# Patient Record
Sex: Male | Born: 1980 | Race: White | Hispanic: No | Marital: Married | Smoking: Never smoker
Health system: Southern US, Community
[De-identification: ages and names within clinical notes are randomized; demographics above are authoritative.]

## PROBLEM LIST (undated history)

## (undated) DIAGNOSIS — K219 Gastro-esophageal reflux disease without esophagitis: Secondary | ICD-10-CM

## (undated) DIAGNOSIS — I10 Essential (primary) hypertension: Secondary | ICD-10-CM

## (undated) DIAGNOSIS — E785 Hyperlipidemia, unspecified: Secondary | ICD-10-CM

## (undated) DIAGNOSIS — K5090 Crohn's disease, unspecified, without complications: Secondary | ICD-10-CM

## (undated) HISTORY — DX: Gastro-esophageal reflux disease without esophagitis: K21.9

## (undated) HISTORY — DX: Hyperlipidemia, unspecified: E78.5

## (undated) HISTORY — DX: Essential (primary) hypertension: I10

## (undated) HISTORY — PX: LIVER BIOPSY: SHX301

## (undated) HISTORY — DX: Crohn's disease, unspecified, without complications: K50.90

---

## 2002-08-20 ENCOUNTER — Encounter: Payer: Self-pay | Admitting: Orthopedic Surgery

## 2002-08-20 ENCOUNTER — Encounter: Admission: RE | Admit: 2002-08-20 | Discharge: 2002-08-20 | Payer: Self-pay | Admitting: Orthopedic Surgery

## 2002-10-23 IMAGING — CR DG ABDOMEN ACUTE W/ 1V CHEST
3 series · 3 of 3 positions shown · non-contrast
Comparison: none

CLINICAL DATA: Abdominal pain. 
 ACUTE ABDOMINAL SERIES WITH CHEST
 No comparison. 
 PA chest demonstrates normal cardiomediastinal contours and clear lungs. 
 Supine and erect views of the abdomen demonstrate a normal bowel gas pattern and no free intraperitoneal air.  No suspicious abdominal calcifications are seen.  The osseous structures appear unremarkable.

[view not recorded (1 of 3)]
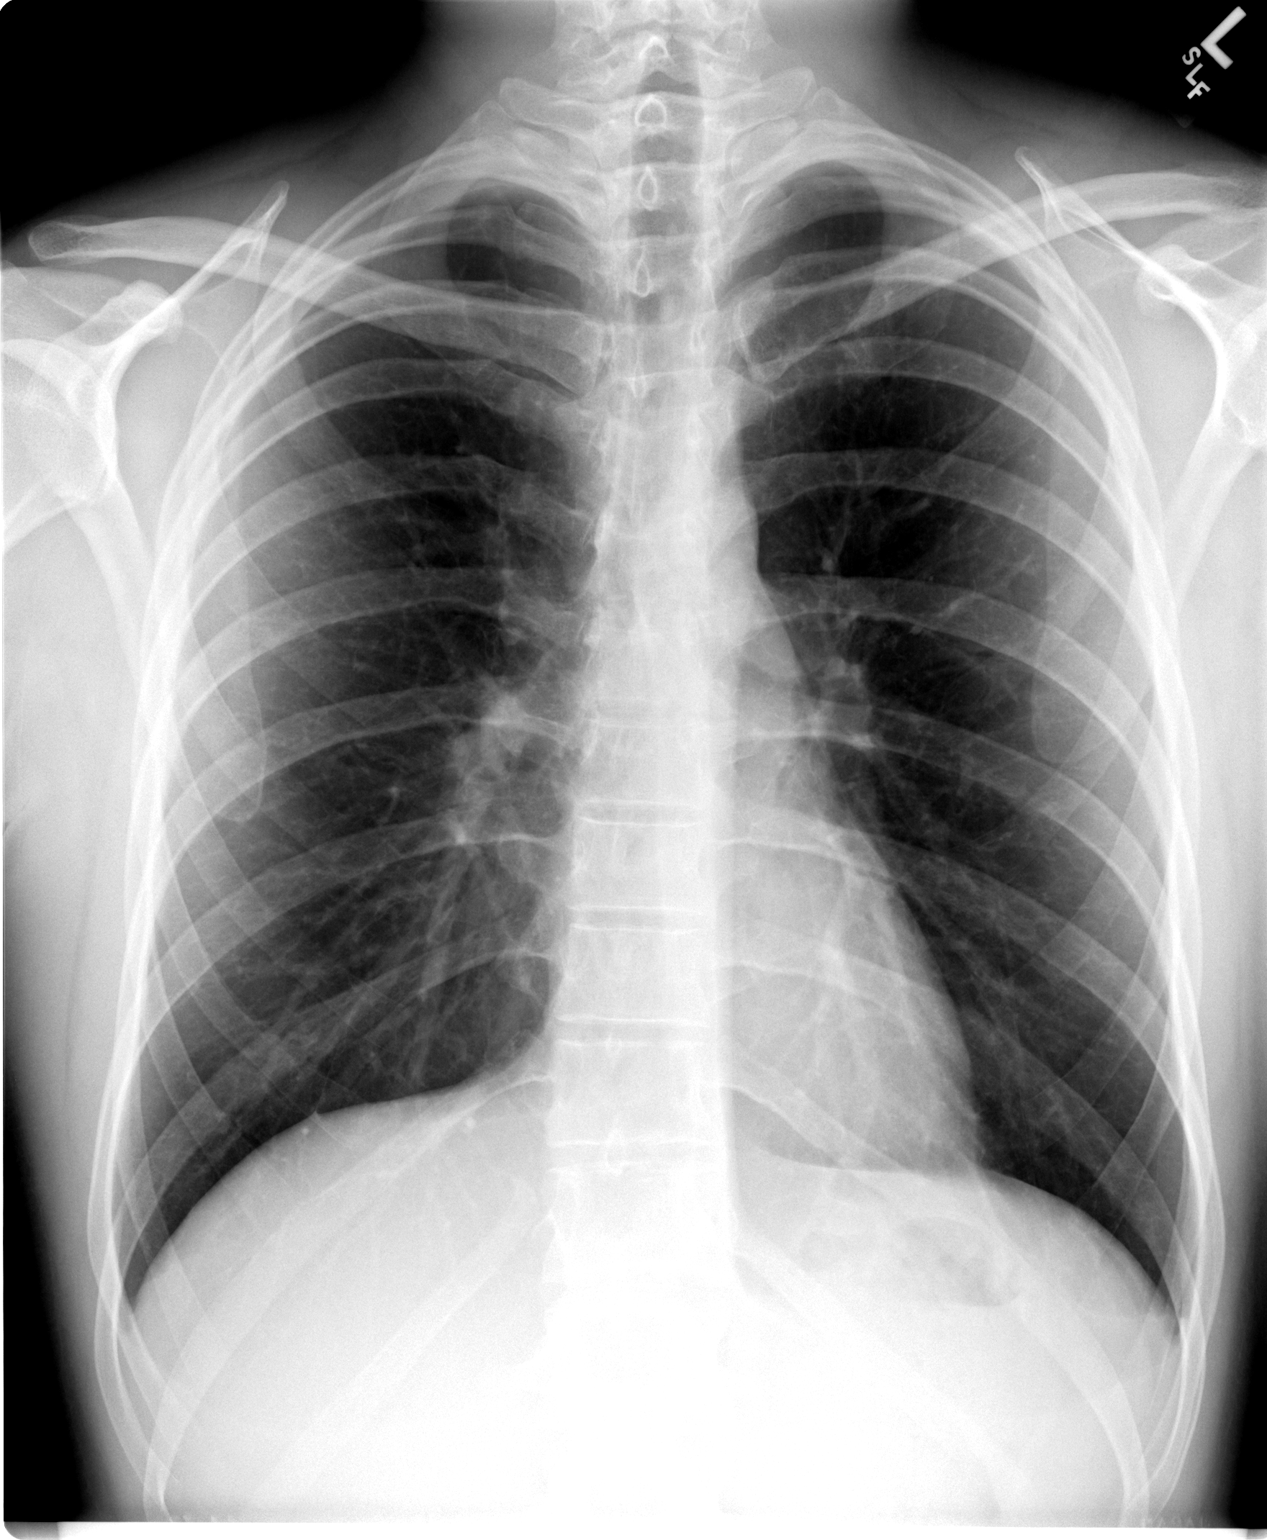

[view not recorded (2 of 3)]
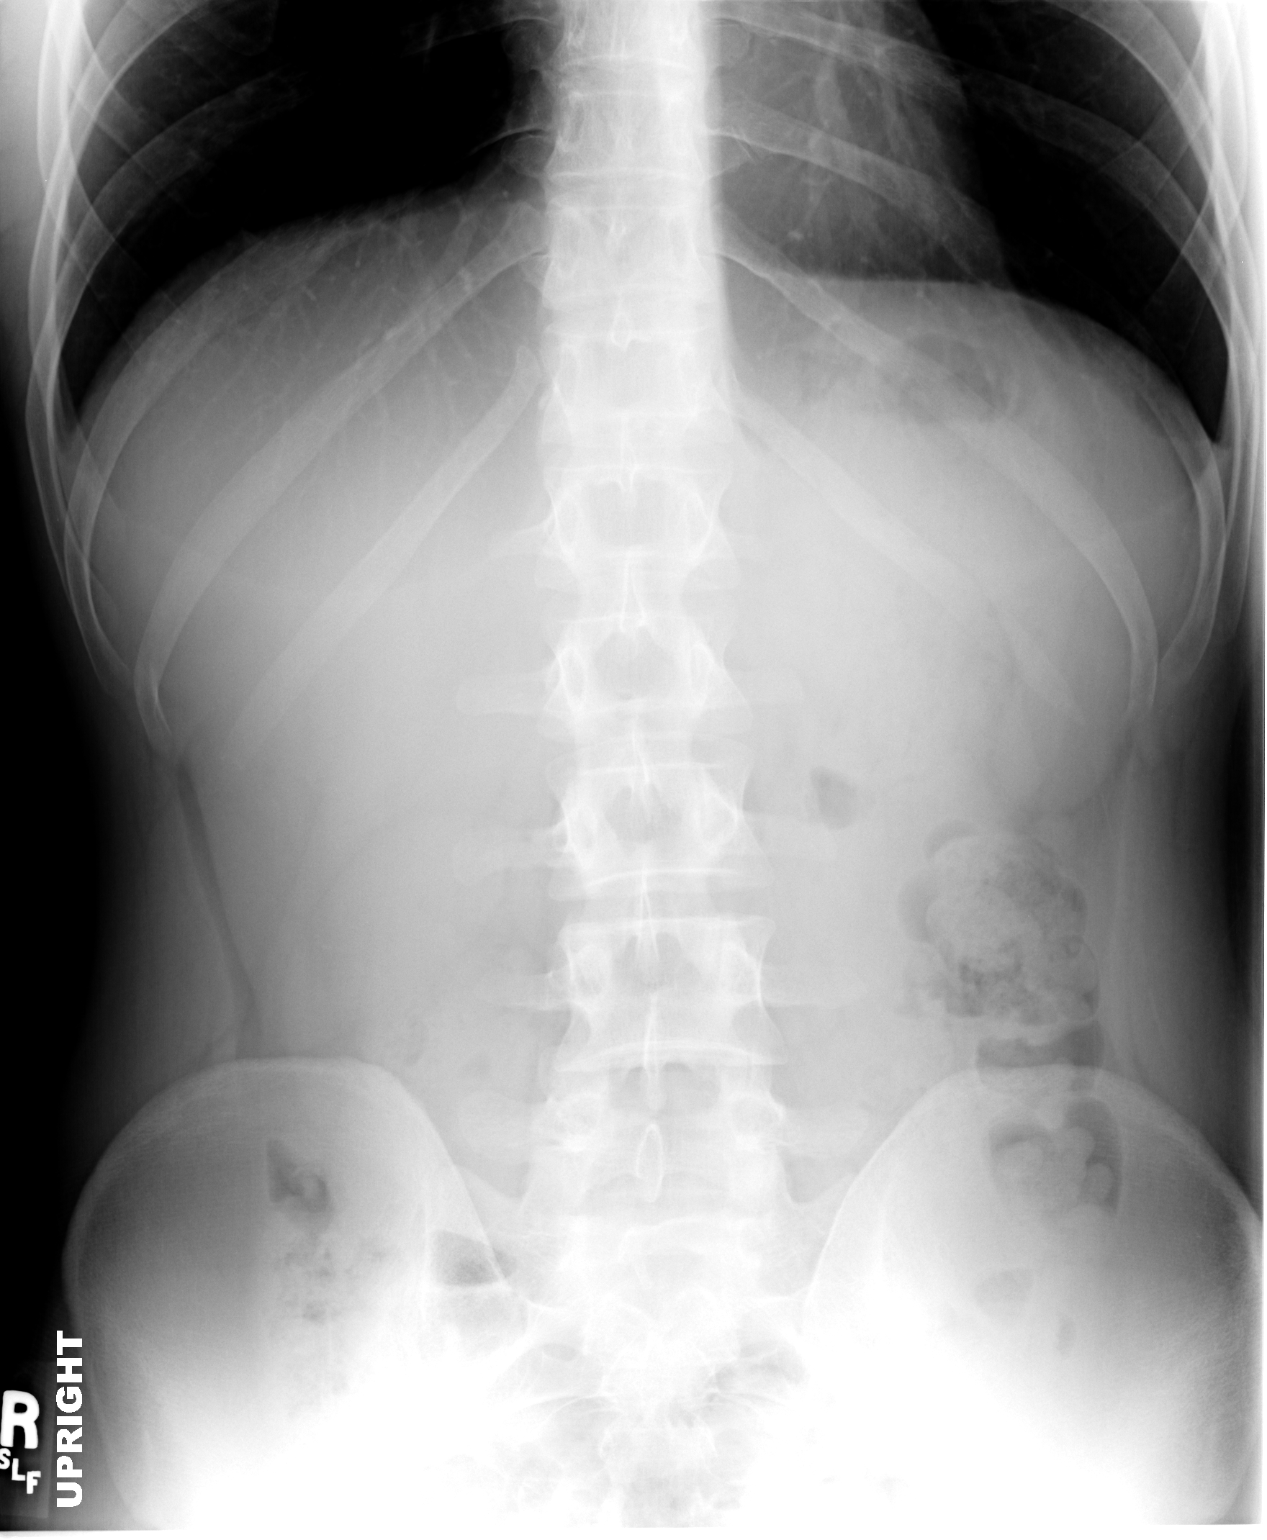

[view not recorded (3 of 3)]
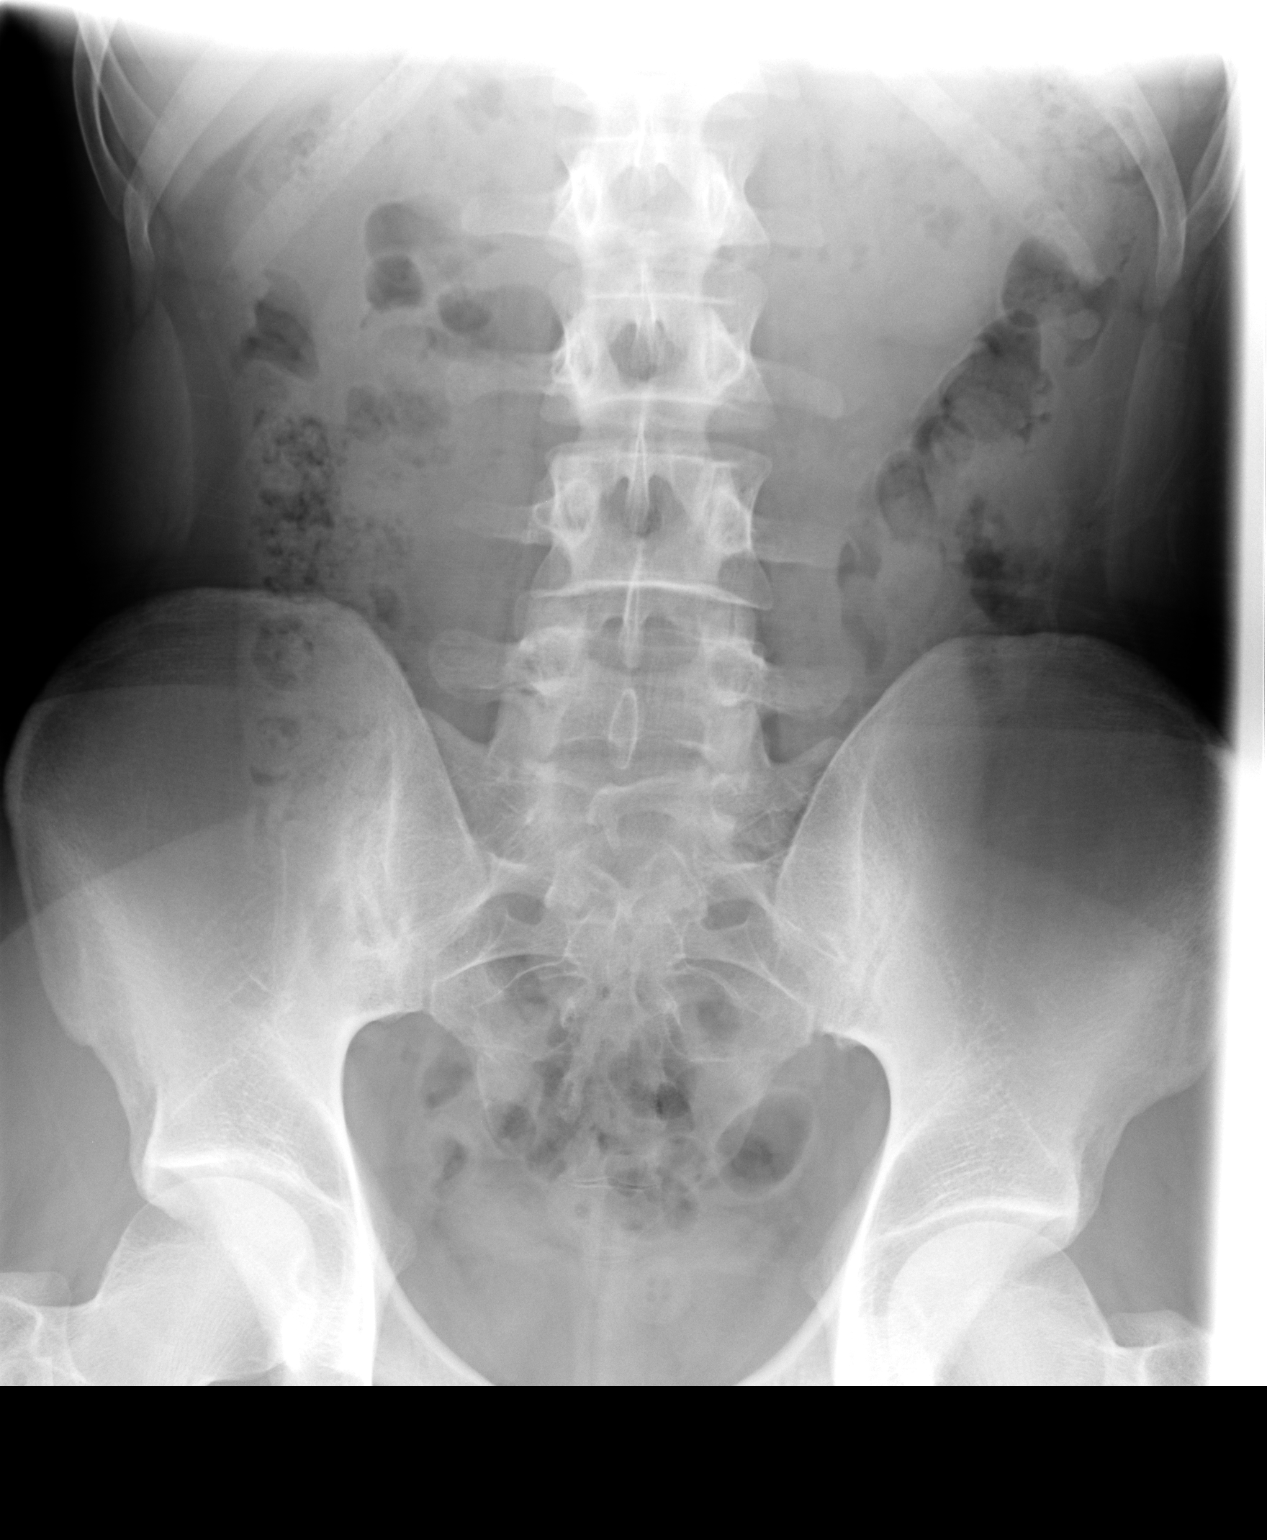

[3 of 3 positions shown; findings below may reference images not displayed]

IMPRESSION: No active cardiopulmonary or abdominal process demonstrated.

## 2003-11-07 ENCOUNTER — Emergency Department (HOSPITAL_COMMUNITY): Admission: EM | Admit: 2003-11-07 | Discharge: 2003-11-07 | Payer: Self-pay | Admitting: Emergency Medicine

## 2004-11-13 ENCOUNTER — Emergency Department: Payer: Self-pay | Admitting: Emergency Medicine

## 2004-11-13 IMAGING — CR DG FOREARM 2V*L*
1 series · 2 of 2 positions shown · non-contrast
Comparison: none

REASON FOR EXAM: Injury
COMMENTS:

[Series 1: view not recorded · 0.17mm/px · 2 of 2 slices shown]
[im 1/2]
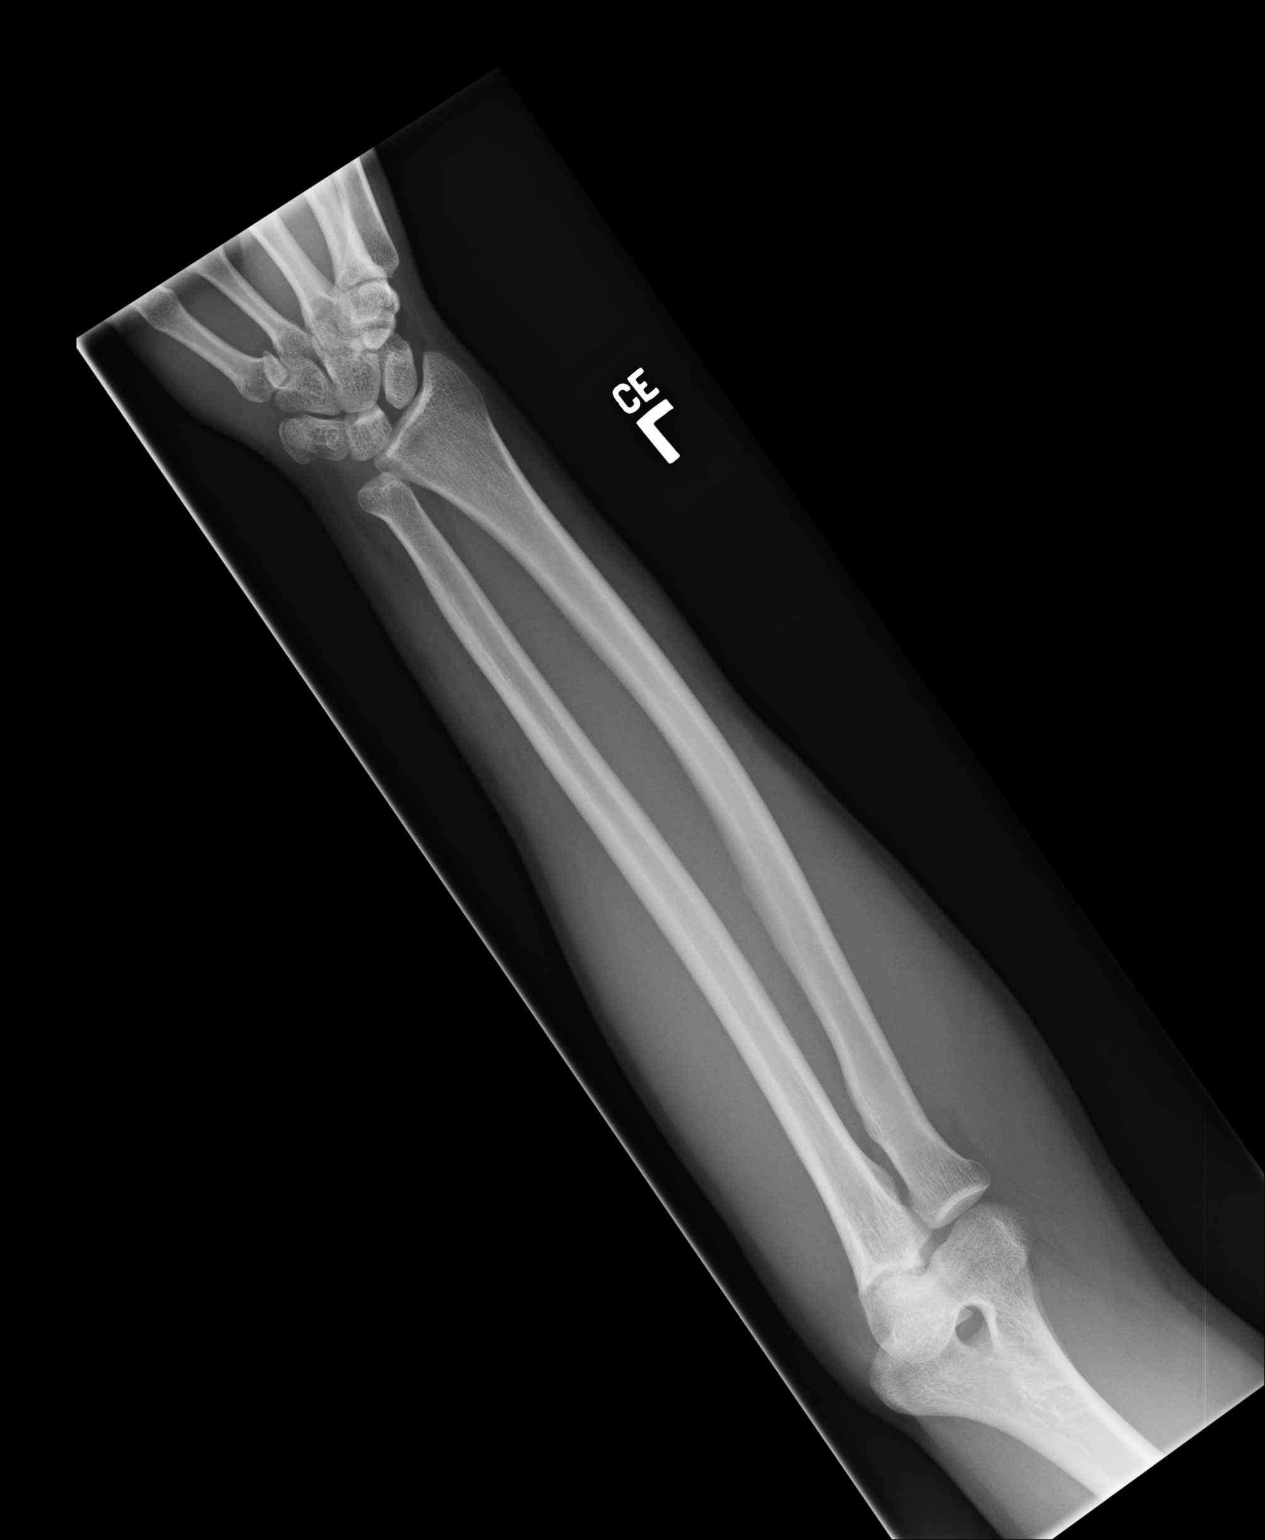
[im 2/2]
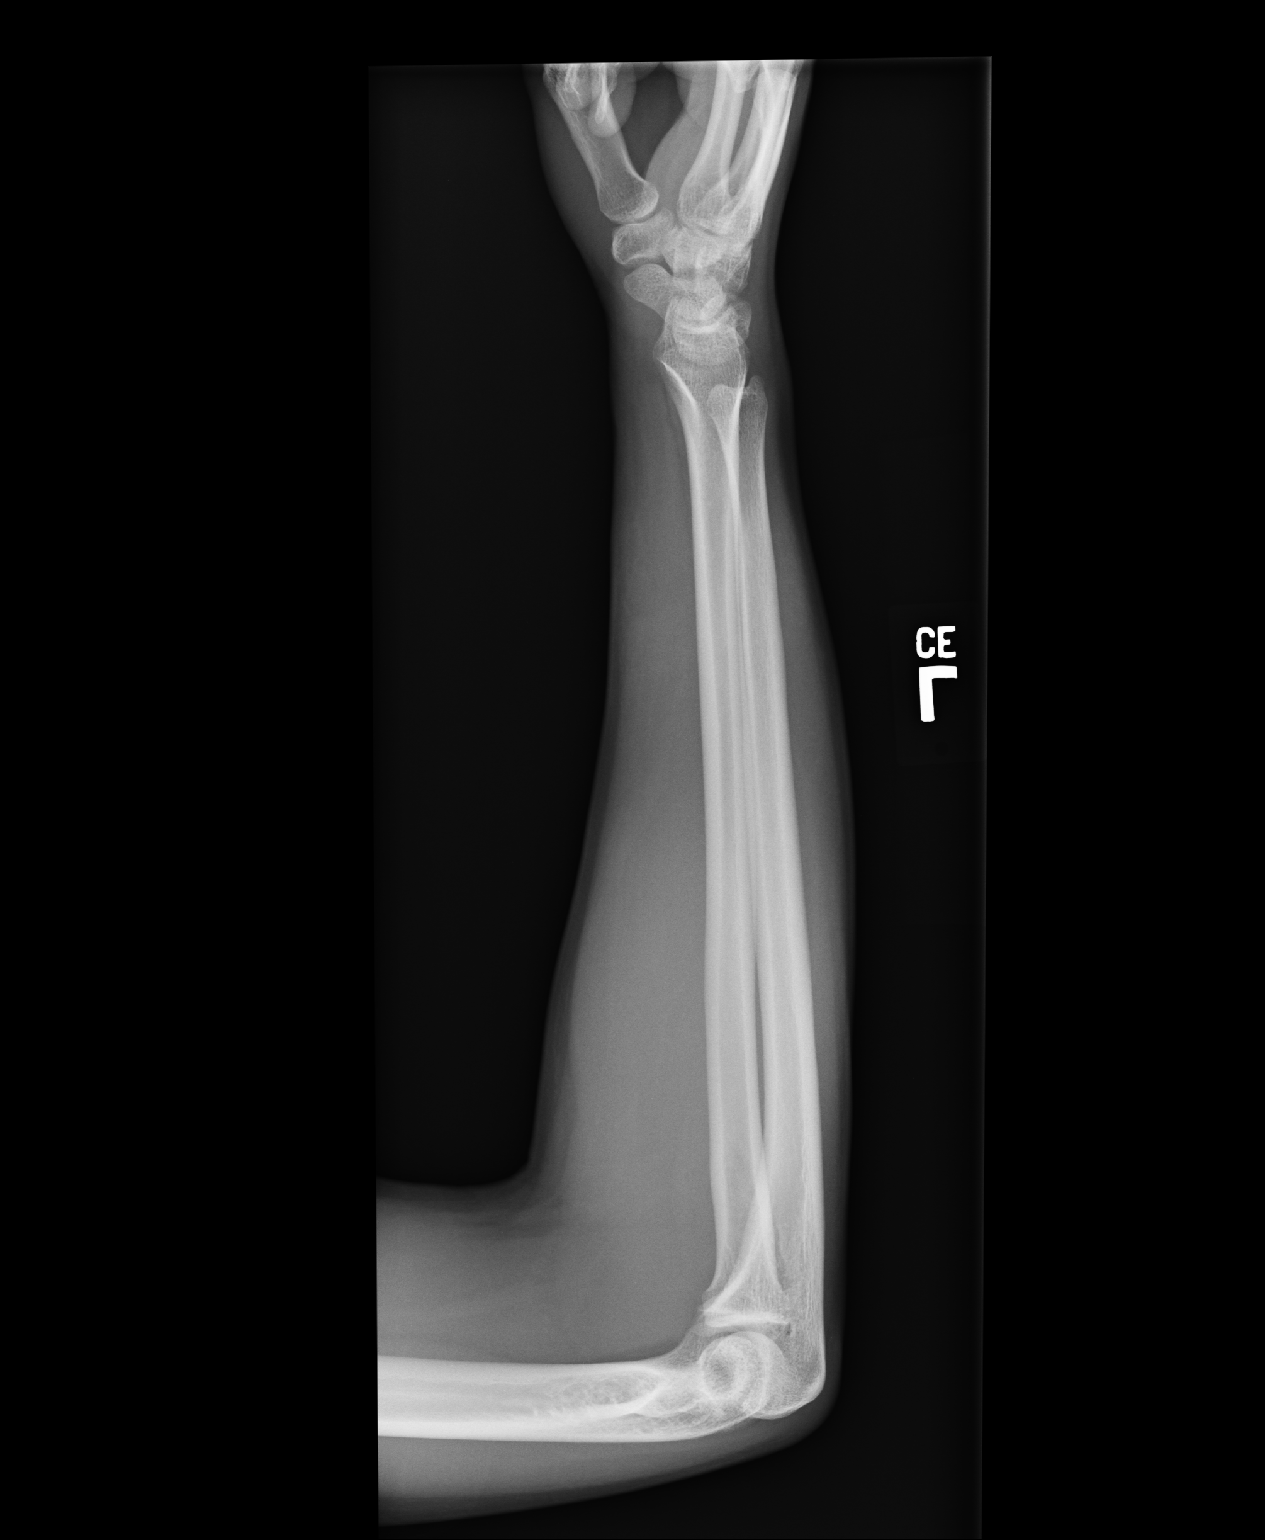

[2 of 2 positions shown; findings below may reference images not displayed]

PROCEDURE:     DXR - DXR FOREARM LEFT  - [DATE] [DATE]

RESULT:     The bones of the forearm appear adequately mineralized.  I do
not see objective evidence of a displaced or nondisplaced fracture.  The
overlying soft tissues are normal in appearance.  The observed portions of
the elbow and wrist appear normal.
IMPRESSION: I do not see evidence of acute fracture of the LEFT
forearm.

## 2005-03-19 ENCOUNTER — Encounter: Admission: RE | Admit: 2005-03-19 | Discharge: 2005-03-19 | Payer: Self-pay | Admitting: Family Medicine

## 2005-03-19 IMAGING — CT CT ABDOMEN WO/W CM
1 of 4 series · 13 of 32 positions shown, 19 images · IV contrast (READICAT/WATER & [ID] OMNI 300)
Comparison: [REDACTED] abdominal radiographs, [DATE]

CLINICAL DATA: Bilateral mid and flank abdominal pain. 
 CT SCAN OF THE ABDOMEN WITH AND WITHOUT CONTRAST:
TECHNIQUE: Multidetector CT imaging of the abdomen was performed both before and during bolus administration of intravenous contrast.
 Contrast:  100 cc Omnipaque 300
TECHNIQUE: Multidetector CT imaging of the pelvis was performed following the standard protocol during bolus administration of intravenous contrast.

[Series 3: abd/pelvis · axial · 0.70mm/px · z∈[-353,+12]mm · 13 of 85 slices shown, 19 images]
[im 6/85  soft-tissue]
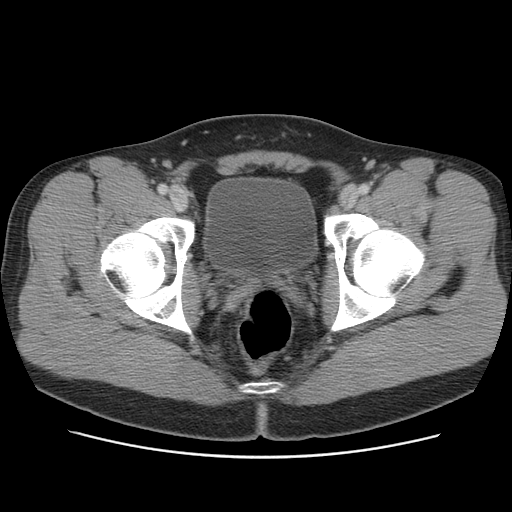
[im 6/85  bone]
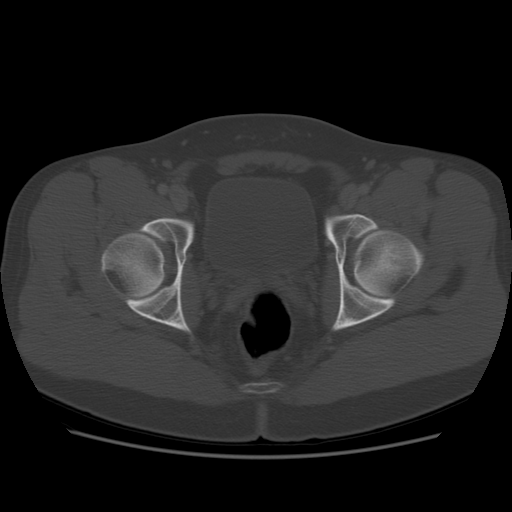
[im 12/85  soft-tissue]
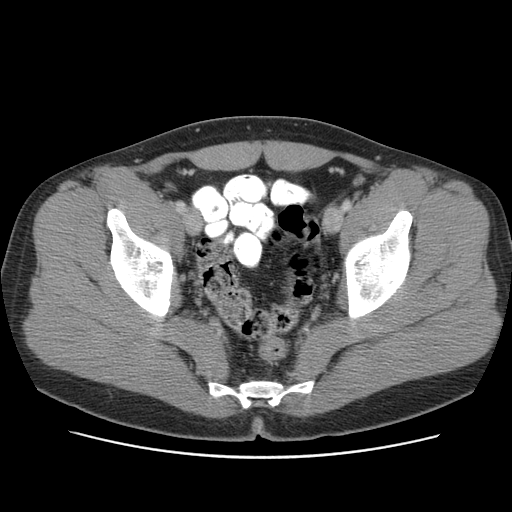
[im 17/85  soft-tissue]
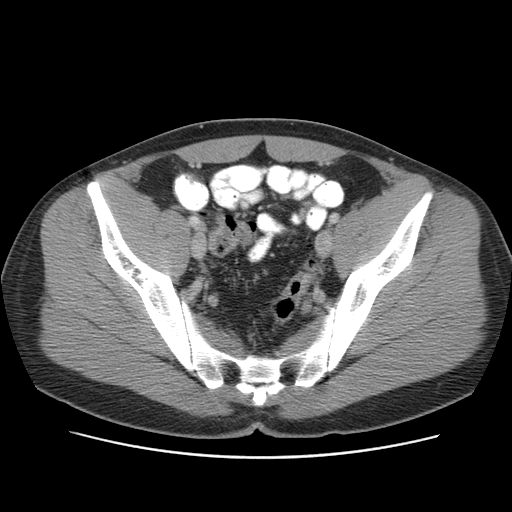
[im 23/85  soft-tissue]
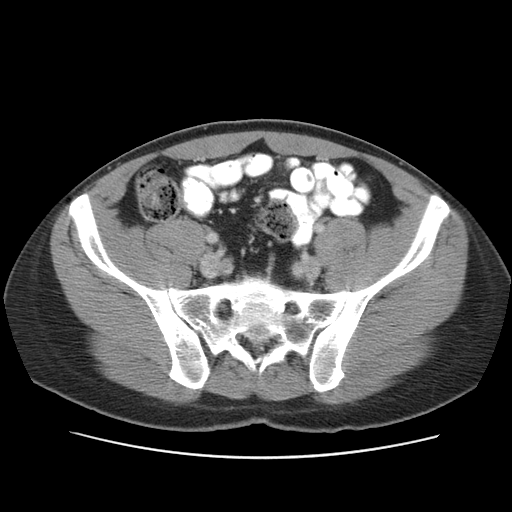
[im 29/85  soft-tissue]
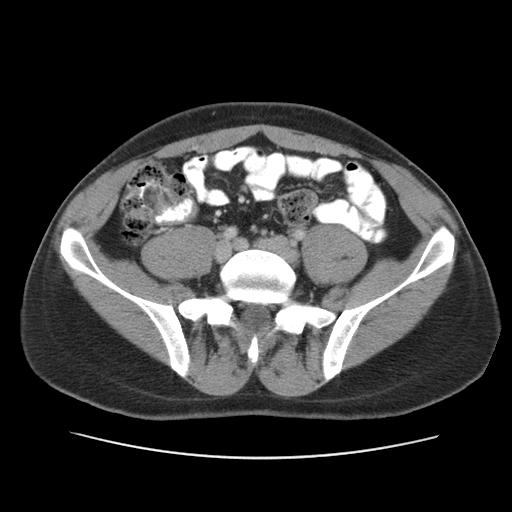
[im 34/85  soft-tissue]
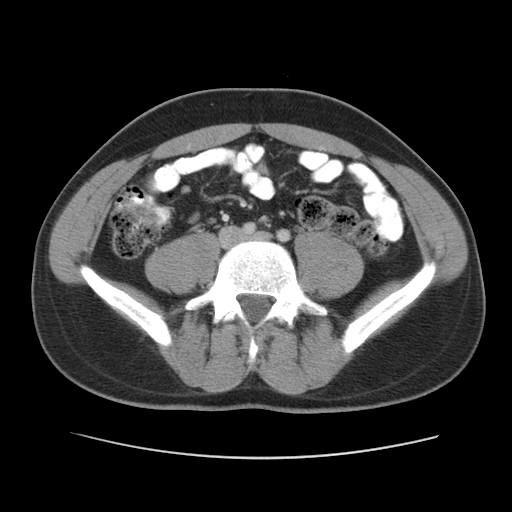
[im 45/85  soft-tissue]
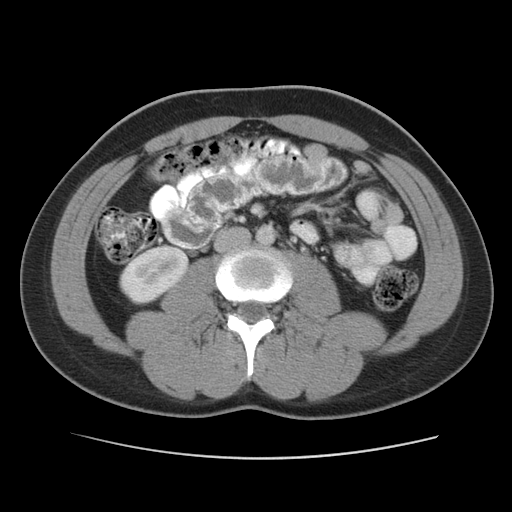
[im 51/85  soft-tissue]
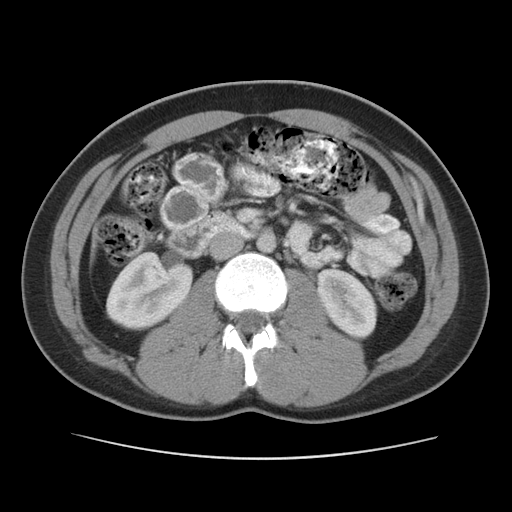
[im 57/85  soft-tissue]
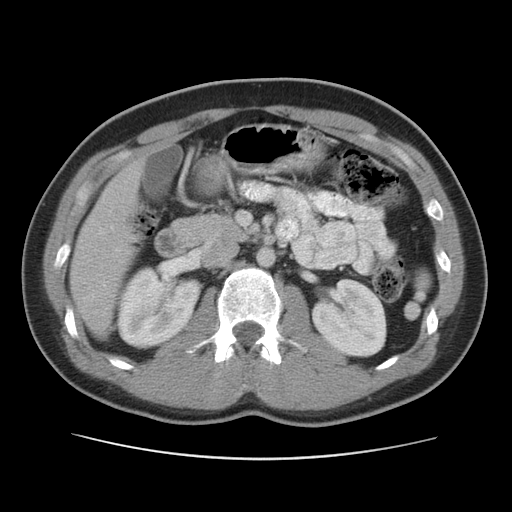
[im 57/85  bone]
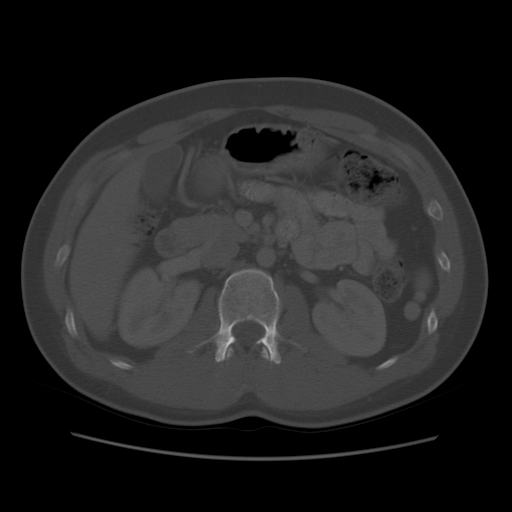
[im 62/85  soft-tissue]
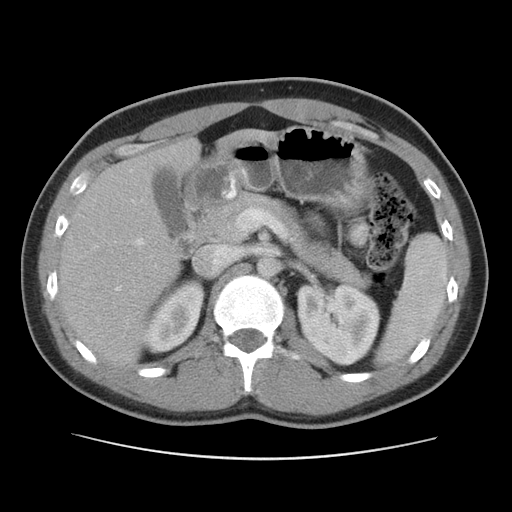
[im 62/85  lung]
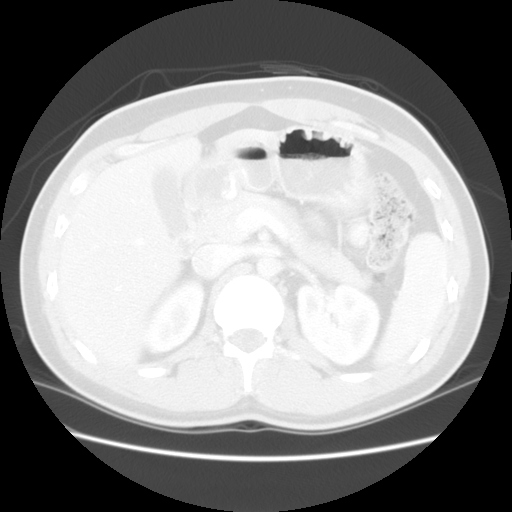
[im 68/85  soft-tissue]
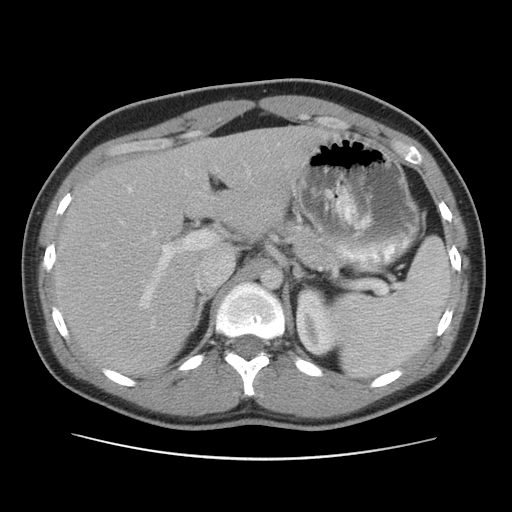
[im 68/85  lung]
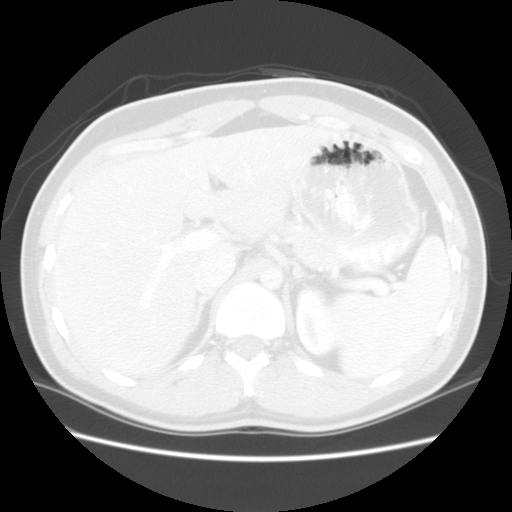
[im 73/85  soft-tissue]
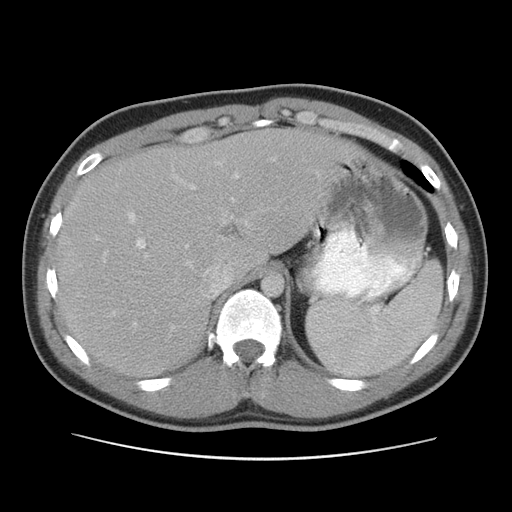
[im 73/85  lung]
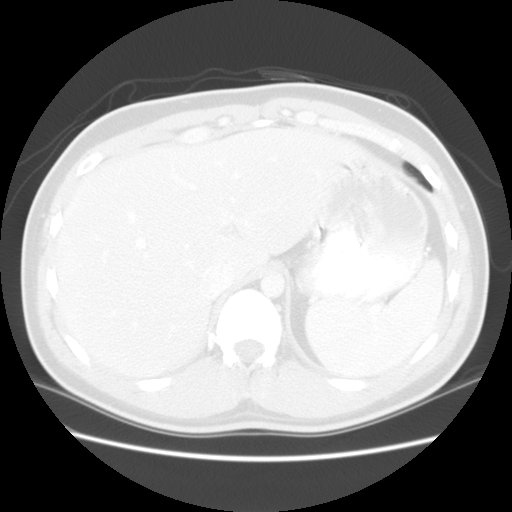
[im 79/85  soft-tissue]
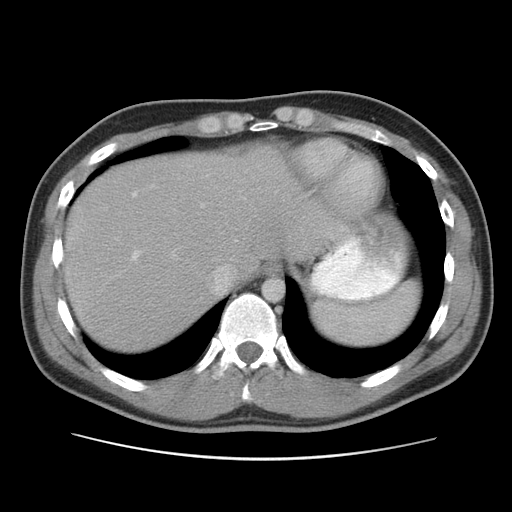
[im 79/85  lung]
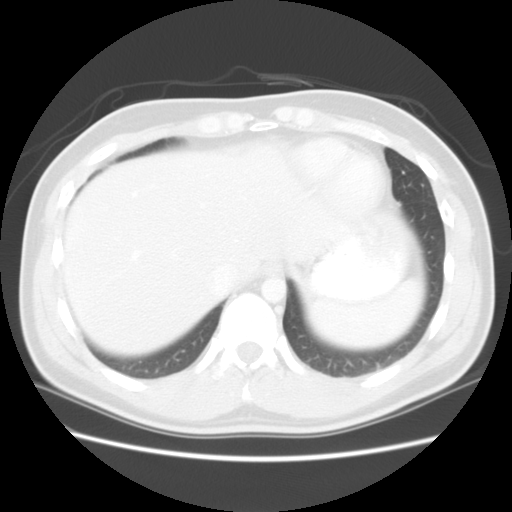

[13 of 32 positions shown; findings below may reference images not displayed]

FINDINGS: The abdominal parenchymal organs are unremarkable.  There is no evidence of mass or adenopathy.  No significant process or abnormal fluid collections are identified.  No other significant abnormality noted.  Constipation findings are seen.  Air-filled proximal appendix appears normal.
IMPRESSION: 1.  Constipation. 
 2.  Otherwise normal.
 PELVIS CT WITH CONTRAST:
FINDINGS: There is no evidence of pelvic mass or adenopathy.  No inflammatory process or abnormal fluid collections are identified.  No other significant abnormality noted.  Constipation findings are seen.
IMPRESSION: 1.  Constipation. 
 2.  Otherwise normal.

## 2011-12-10 DIAGNOSIS — E8881 Metabolic syndrome: Secondary | ICD-10-CM | POA: Insufficient documentation

## 2013-10-01 DIAGNOSIS — I10 Essential (primary) hypertension: Secondary | ICD-10-CM | POA: Insufficient documentation

## 2014-05-25 DIAGNOSIS — E785 Hyperlipidemia, unspecified: Secondary | ICD-10-CM | POA: Insufficient documentation

## 2016-06-10 ENCOUNTER — Telehealth: Payer: Self-pay | Admitting: *Deleted

## 2016-06-10 ENCOUNTER — Encounter: Payer: Self-pay | Admitting: *Deleted

## 2016-06-10 NOTE — Telephone Encounter (Signed)
PreVisit Call completed. Pt will bring in a list of medications to visit. No acute complaints.

## 2016-06-11 ENCOUNTER — Encounter: Payer: Self-pay | Admitting: Family Medicine

## 2016-06-11 ENCOUNTER — Ambulatory Visit (INDEPENDENT_AMBULATORY_CARE_PROVIDER_SITE_OTHER): Payer: 59 | Admitting: Family Medicine

## 2016-06-11 VITALS — BP 126/76 | HR 82 | Temp 98.5°F | Ht 76.0 in | Wt 232.6 lb

## 2016-06-11 DIAGNOSIS — R7303 Prediabetes: Secondary | ICD-10-CM

## 2016-06-11 DIAGNOSIS — R41840 Attention and concentration deficit: Secondary | ICD-10-CM

## 2016-06-11 DIAGNOSIS — K219 Gastro-esophageal reflux disease without esophagitis: Secondary | ICD-10-CM | POA: Insufficient documentation

## 2016-06-11 DIAGNOSIS — R202 Paresthesia of skin: Secondary | ICD-10-CM

## 2016-06-11 DIAGNOSIS — Z7689 Persons encountering health services in other specified circumstances: Secondary | ICD-10-CM

## 2016-06-11 DIAGNOSIS — L309 Dermatitis, unspecified: Secondary | ICD-10-CM | POA: Insufficient documentation

## 2016-06-11 DIAGNOSIS — K589 Irritable bowel syndrome without diarrhea: Secondary | ICD-10-CM | POA: Insufficient documentation

## 2016-06-11 DIAGNOSIS — R200 Anesthesia of skin: Secondary | ICD-10-CM

## 2016-06-11 DIAGNOSIS — K760 Fatty (change of) liver, not elsewhere classified: Secondary | ICD-10-CM | POA: Insufficient documentation

## 2016-06-11 DIAGNOSIS — E538 Deficiency of other specified B group vitamins: Secondary | ICD-10-CM | POA: Insufficient documentation

## 2016-06-11 DIAGNOSIS — K5090 Crohn's disease, unspecified, without complications: Secondary | ICD-10-CM | POA: Insufficient documentation

## 2016-06-11 LAB — HEMOGLOBIN A1C: HEMOGLOBIN A1C: 6.3 % (ref 4.6–6.5)

## 2016-06-11 LAB — VITAMIN B12: VITAMIN B 12: 189 pg/mL — AB (ref 211–911)

## 2016-06-11 LAB — VITAMIN D 25 HYDROXY (VIT D DEFICIENCY, FRACTURES): VITD: 12.63 ng/mL — AB (ref 30.00–100.00)

## 2016-06-11 LAB — TSH: TSH: 1.99 u[IU]/mL (ref 0.35–4.50)

## 2016-06-11 NOTE — Progress Notes (Signed)
Pre visit review using our clinic review tool, if applicable. No additional management support is needed unless otherwise documented below in the visit note. 

## 2016-06-11 NOTE — Progress Notes (Signed)
Subjective:    Patient ID: Johnny [name], male    DOB: Dec 27, 1980, 36 y.o.   MRN: [id number]  HPI This is a 36 yo male who presents today to establish care. Was previously seen by Dr. Salvadore Dom at [place]. Last visit 03/04/16.   Sees Dr. Glennon [facility] in [place] for Crohn's, diagnosed at ae 24. No GI complaints currently.  Has been keeping an eye on his blood sugars. Has noticed some tingling in hands and feet and head feels "swimmy." Unable to determine what brings it on, but it happens frequently. Hands feel numb and he feels "tingly," up arms and legs. Seems to take more time to focus. Seems to be worse if he feels stressed. He is Investment banker, operational. Sits at desk on computer. The only thing different was that he started taking Align (3 months ago), stopped a couple days ago and thinks symptoms improved.   He likes sports, swimming. Has 2,4 yo kids.   HgbA1C 6.2 02/27/16. Had 2 hour glucose tolerance testing in past.    Last CPE- last year Colonoscopy- not sure when, doesn't need until age 33 Tdap- 12/10/2011 Flu- annual Dental- regular Eye- less than 2 years ago, wears contacts Exercise- off and on   Past Medical History:  Diagnosis Date  . Crohn's disease ([place])   . GERD (gastroesophageal reflux disease)   . Hyperlipidemia   . Hypertension    No past surgical history on file. No family history on file. Social History  Substance Use Topics  . Smoking status: Never Smoker  . Smokeless tobacco: Never Used  . Alcohol use No      Review of Systems  Constitutional: Negative for fatigue and unexpected weight change.  Eyes: Negative for photophobia.  Respiratory: Negative for cough, chest tightness and shortness of breath.   Cardiovascular: Negative for chest pain and leg swelling.  Gastrointestinal: Negative for abdominal pain, anal bleeding, blood in stool, constipation, diarrhea, nausea and vomiting.  Genitourinary: Negative.   Musculoskeletal: Negative.     Allergic/Immunologic: Positive for environmental allergies (occasional).  Psychiatric/Behavioral: Negative.        Objective:   Physical Exam  Constitutional: He is oriented to person, place, and time. He appears well-developed and well-nourished. No distress.  HENT:  Head: Normocephalic and atraumatic.  Right Ear: Tympanic membrane, external ear and ear canal normal.  Left Ear: Tympanic membrane, external ear and ear canal normal.  Nose: Nose normal.  Mouth/Throat: Oropharynx is clear and moist. No oropharyngeal exudate.  Eyes: Conjunctivae and EOM are normal. Pupils are equal, round, and reactive to light. Right eye exhibits no discharge. Left eye exhibits no discharge. No scleral icterus.  Neck: Normal range of motion. Neck supple.  Cardiovascular: Normal rate, regular rhythm, normal heart sounds and intact distal pulses.   Pulmonary/Chest: Effort normal and breath sounds normal.  Abdominal: Soft. Bowel sounds are normal. He exhibits no distension. There is no tenderness. There is no rebound and no guarding.  Musculoskeletal: He exhibits no edema.  Neurological: He is alert and oriented to person, place, and time. He has normal reflexes. No cranial nerve deficit. Coordination normal.  Sensation intact UE/LE  Skin: Skin is warm and dry. He is not diaphoretic.  Bilateral LE with intact skin, warm, dry, no vascular changes.   Psychiatric: He has a normal mood and affect. His behavior is normal. Judgment and thought content normal.  Vitals reviewed.     BP 126/76 (BP Location: Right Arm, Patient Position: Sitting, Cuff Size: Normal)  Pulse 82   Temp 98.5 F (36.9 C) (Oral)   Ht 6' 4"  (1.93 m)   Wt 232 lb 9.6 oz (105.5 kg)   SpO2 98%   BMI 28.31 kg/m  Wt Readings from Last 3 Encounters:  06/11/16 232 lb 9.6 oz (105.5 kg)   Down from 249 pounds 1/18 with diet   Depression screen PHQ 2/9 06/11/2016  Decreased Interest 0  Down, Depressed, Hopeless 0  PHQ - 2 Score 0        Assessment & Plan:  1. Encounter to establish care - reviewed records from previous PCP  2. Prediabetes - Hemoglobin A1c  3. Numbness and tingling - HgbA1C was 6.2 prior to weight loss, would not expect symptoms to be coming from hyperglycemia - continue to hold Align - Hemoglobin A1c - TSH - Vitamin B12 - VITAMIN D 25 Hydroxy (Vit-D Deficiency, Fractures)  4. Poor concentration - TSH - Vitamin B12 - VITAMIN D 25 Hydroxy (Vit-D Deficiency, Fractures)  - follow up in 6 months   Clarene Reamer, FNP-BC  [facility] [place], [facility]  06/11/2016 2:22 PM

## 2016-06-11 NOTE — Patient Instructions (Signed)
Thanks for coming in today!  Stay off Align  I will touch base with you next week with your lab results and we will go from there

## 2016-06-14 ENCOUNTER — Telehealth: Payer: Self-pay | Admitting: Family Medicine

## 2016-06-14 DIAGNOSIS — E559 Vitamin D deficiency, unspecified: Secondary | ICD-10-CM

## 2016-06-14 MED ORDER — VITAMIN D3 1.25 MG (50000 UT) PO CAPS: ORAL_CAPSULE | ORAL | 1 refills | Status: DC

## 2016-06-14 NOTE — Telephone Encounter (Signed)
Called patient regarding labs. See result note for details.

## 2016-06-14 NOTE — Telephone Encounter (Signed)
Please advise 

## 2016-06-14 NOTE — Telephone Encounter (Signed)
Patient called to see if lab results were back yet. Please call patient on mobile phone with results once they are ready. Okay to leave a detailed message on mobile phone.

## 2016-06-15 ENCOUNTER — Other Ambulatory Visit: Payer: Self-pay | Admitting: Family Medicine

## 2016-06-15 ENCOUNTER — Encounter: Payer: Self-pay | Admitting: Family Medicine

## 2016-06-15 DIAGNOSIS — K5090 Crohn's disease, unspecified, without complications: Secondary | ICD-10-CM

## 2016-06-15 DIAGNOSIS — E538 Deficiency of other specified B group vitamins: Secondary | ICD-10-CM

## 2016-06-15 MED ORDER — CYANOCOBALAMIN 1000 MCG/ML IJ SOLN: 1000.0000 ug | INTRAMUSCULAR | Status: DC

## 2016-06-16 ENCOUNTER — Ambulatory Visit (INDEPENDENT_AMBULATORY_CARE_PROVIDER_SITE_OTHER): Payer: 59

## 2016-06-16 DIAGNOSIS — E538 Deficiency of other specified B group vitamins: Secondary | ICD-10-CM | POA: Diagnosis not present

## 2016-06-16 MED ORDER — CYANOCOBALAMIN 1000 MCG/ML IJ SOLN
1000.0000 ug | Freq: Once | INTRAMUSCULAR | Status: AC
  Administered 2016-06-16: 1000 ug via INTRAMUSCULAR

## 2016-06-16 NOTE — Progress Notes (Signed)
Pre visit review using our clinic review tool, if applicable. No additional management support is needed unless otherwise documented below in the visit note. 

## 2016-06-16 NOTE — Progress Notes (Signed)
Johnny [name] presents for his first of six vitamin b12 injections. He tolerated medication well. Advised to schedule next month for 2nd dose. He is agreeable.

## 2016-06-21 NOTE — Progress Notes (Signed)
Note reviewed. Patient with vitamin B12 deficiency and Crohn's, therefore not a candidate for oral replacement.

## 2016-07-14 ENCOUNTER — Ambulatory Visit (INDEPENDENT_AMBULATORY_CARE_PROVIDER_SITE_OTHER): Payer: 59 | Admitting: *Deleted

## 2016-07-14 DIAGNOSIS — E538 Deficiency of other specified B group vitamins: Secondary | ICD-10-CM

## 2016-07-14 MED ORDER — CYANOCOBALAMIN 1000 MCG/ML IJ SOLN
1000.0000 ug | Freq: Once | INTRAMUSCULAR | Status: AC
  Administered 2016-07-14: 1000 ug via INTRAMUSCULAR

## 2016-07-14 NOTE — Progress Notes (Signed)
B12 injection given for documented B12 deficiency.

## 2016-08-20 ENCOUNTER — Ambulatory Visit (INDEPENDENT_AMBULATORY_CARE_PROVIDER_SITE_OTHER): Payer: 59 | Admitting: Emergency Medicine

## 2016-08-20 DIAGNOSIS — E538 Deficiency of other specified B group vitamins: Secondary | ICD-10-CM | POA: Diagnosis not present

## 2016-08-20 MED ORDER — CYANOCOBALAMIN 1000 MCG/ML IJ SOLN
1000.0000 ug | Freq: Once | INTRAMUSCULAR | Status: AC
  Administered 2016-08-20: 1000 ug via INTRAMUSCULAR

## 2016-08-20 NOTE — Progress Notes (Signed)
Patient given B12 injection for Vitamin B12 deficiency.

## 2016-09-30 ENCOUNTER — Encounter: Payer: Self-pay | Admitting: Family Medicine

## 2016-10-01 ENCOUNTER — Other Ambulatory Visit: Payer: Self-pay | Admitting: Family Medicine

## 2016-10-01 MED ORDER — TRIAMCINOLONE ACETONIDE 0.5 % EX CREA: TOPICAL_CREAM | Freq: Two times a day (BID) | CUTANEOUS | 1 refills | Status: DC

## 2016-10-01 MED ORDER — AMLODIPINE BESYLATE 10 MG PO TABS: 10.0000 mg | ORAL_TABLET | Freq: Every day | ORAL | 3 refills | Status: DC

## 2016-10-01 MED ORDER — ATORVASTATIN CALCIUM 40 MG PO TABS: 40.0000 mg | ORAL_TABLET | Freq: Every day | ORAL | 3 refills | Status: DC

## 2016-10-01 MED ORDER — ICOSAPENT ETHYL 1 G PO CAPS: 2.0000 g | ORAL_CAPSULE | Freq: Two times a day (BID) | ORAL | 5 refills | Status: DC

## 2016-10-18 ENCOUNTER — Encounter: Payer: Self-pay | Admitting: Family Medicine

## 2016-12-24 ENCOUNTER — Encounter: Payer: Self-pay | Admitting: Family Medicine

## 2016-12-24 ENCOUNTER — Ambulatory Visit (INDEPENDENT_AMBULATORY_CARE_PROVIDER_SITE_OTHER): Payer: 59 | Admitting: Family Medicine

## 2016-12-24 VITALS — BP 100/80 | HR 85 | Ht 76.0 in | Wt 233.8 lb

## 2016-12-24 DIAGNOSIS — E538 Deficiency of other specified B group vitamins: Secondary | ICD-10-CM | POA: Diagnosis not present

## 2016-12-24 DIAGNOSIS — H9319 Tinnitus, unspecified ear: Secondary | ICD-10-CM | POA: Insufficient documentation

## 2016-12-24 DIAGNOSIS — E785 Hyperlipidemia, unspecified: Secondary | ICD-10-CM

## 2016-12-24 DIAGNOSIS — I10 Essential (primary) hypertension: Secondary | ICD-10-CM

## 2016-12-24 DIAGNOSIS — K5090 Crohn's disease, unspecified, without complications: Secondary | ICD-10-CM | POA: Diagnosis not present

## 2016-12-24 DIAGNOSIS — H9313 Tinnitus, bilateral: Secondary | ICD-10-CM

## 2016-12-24 DIAGNOSIS — R7303 Prediabetes: Secondary | ICD-10-CM | POA: Diagnosis not present

## 2016-12-24 LAB — LIPID PANEL
CHOLESTEROL: 172 mg/dL (ref 0–200)
HDL: 38 mg/dL — AB (ref >39.00)
NONHDL: 133.6
TRIGLYCERIDES: 237 mg/dL — AB (ref 0.0–149.0)
Total CHOL/HDL Ratio: 5
VLDL: 47.4 mg/dL — ABNORMAL HIGH (ref 0.0–40.0)

## 2016-12-24 LAB — CBC
HEMATOCRIT: 48.2 % (ref 39.0–52.0)
HEMOGLOBIN: 15.2 g/dL (ref 13.0–17.0)
MCHC: 31.6 g/dL (ref 30.0–36.0)
MCV: 72.8 fl — ABNORMAL LOW (ref 78.0–100.0)
PLATELETS: 256 10*3/uL (ref 150.0–400.0)
RBC: 6.62 Mil/uL — ABNORMAL HIGH (ref 4.22–5.81)
RDW: 14.1 % (ref 11.5–15.5)
WBC: 8 10*3/uL (ref 4.0–10.5)

## 2016-12-24 LAB — COMPREHENSIVE METABOLIC PANEL WITH GFR
ALT: 49 U/L (ref 0–53)
AST: 24 U/L (ref 0–37)
Albumin: 4.7 g/dL (ref 3.5–5.2)
Alkaline Phosphatase: 68 U/L (ref 39–117)
BUN: 19 mg/dL (ref 6–23)
CO2: 27 meq/L (ref 19–32)
Calcium: 9.9 mg/dL (ref 8.4–10.5)
Chloride: 103 meq/L (ref 96–112)
Creatinine, Ser: 0.93 mg/dL (ref 0.40–1.50)
GFR: 96.94 mL/min
Glucose, Bld: 107 mg/dL — ABNORMAL HIGH (ref 70–99)
Potassium: 4.4 meq/L (ref 3.5–5.1)
Sodium: 139 meq/L (ref 135–145)
Total Bilirubin: 0.6 mg/dL (ref 0.2–1.2)
Total Protein: 7.7 g/dL (ref 6.0–8.3)

## 2016-12-24 LAB — VITAMIN B12: Vitamin B-12: 508 pg/mL (ref 211–911)

## 2016-12-24 LAB — LDL CHOLESTEROL, DIRECT: Direct LDL: 110 mg/dL

## 2016-12-24 LAB — HEMOGLOBIN A1C: HEMOGLOBIN A1C: 6.1 % (ref 4.6–6.5)

## 2016-12-24 MED ORDER — ATORVASTATIN CALCIUM 20 MG PO TABS: 20.0000 mg | ORAL_TABLET | Freq: Every day | ORAL | 3 refills | Status: DC

## 2016-12-24 NOTE — Assessment & Plan Note (Signed)
Continue oral replacement. Check B12 level today.

## 2016-12-24 NOTE — Assessment & Plan Note (Signed)
Discussed lifestyle modifications. Check A1c today.

## 2016-12-24 NOTE — Assessment & Plan Note (Signed)
At goal. Continue amlodipine.

## 2016-12-24 NOTE — Assessment & Plan Note (Signed)
Given myalgias, will decrease atorvastatin to 79m daily. Check lipid panel today.

## 2016-12-24 NOTE — Progress Notes (Signed)
   Subjective:  Johnny [name] is a 36 y.o. male who presents today with a chief complaint of prediabetes.   HPI:  Prediabetes, chronic issue, stable Diet controlled.  Not on any medications.  No polyuria or polydipsia.  Hypertension, Chronic Problem, Stable BP Readings from Last 3 Encounters:  12/24/16 100/80  06/11/16 126/76   Current Medications: Amlodipine 10 mg daily, compliant without side effects. Interim History: He has noticed more muscle cramps since starting the amlodipine.  ROS: Denies any chest pain, shortness of breath, dyspnea on exertion, leg edema.   Hyperlipidemia, Chronic, Stable Current medication(s): Atorvastatin 40 mg daily Side Effects: Occasional muscle cramps  ROS: No chest pain or shortness of breath.   Crohn's disease, chronic, stable Currently on melasamine.  No recent flareups.  No diarrhea. No Abdominal pain.  B12 deficiency, chronic, stable Is getting B12 injections here however was unable to afford paying $60 a month for this.  He has been taking oral replacement instead.  Ear ringing, new issue Several year history.  Located in both ears.  Stable over the last several years.  No treatments tried.  Has a history of going to concerts.  No other known exposures.  No hearing loss.  No dizziness.  No weakness or numbness.  ROS: Per HPI  PMH: Smoking history reviewed. Never smoker.   Objective:  Physical Exam: BP 100/80   Pulse 85   Ht 6' 4"  (1.93 m)   Wt 233 lb 12.8 oz (106.1 kg)   SpO2 98%   BMI 28.46 kg/m   Gen: NAD, resting comfortably CV: RRR with no murmurs appreciated Pulm: NWOB, CTAB with no crackles, wheezes, or rhonchi GI: Normal bowel sounds present. Soft, Nontender, Nondistended. MSK: No edema, cyanosis, or clubbing noted Skin: Warm, dry Neuro: Grossly normal, moves all extremities Psych: Normal affect and thought content  Assessment/Plan:  Benign essential hypertension At goal. Continue amlodipine.    Dyslipidemia Given myalgias, will decrease atorvastatin to 26m daily. Check lipid panel today.   Prediabetes Discussed lifestyle modifications. Check A1c today.   Vitamin B12 deficiency Continue oral replacement. Check B12 level today.   [place] controlled on current regimen. Will continue.   Tinnitus No red flag signs or symptoms. Likely related to sound exposure. Discussed ways to mask symptoms including white noise machine and noise protection. Return precautions reviewed.   Follow up in 6 months pending results of above testing.   [name] MD 12/24/2016 2:46 PM

## 2016-12-24 NOTE — Patient Instructions (Addendum)
We will check blood work today.  Decrease atorvastatin to 22m daily to see if it helps with your symptoms.  Come back to see me in 6 months, or sooner as needed.  Take care,  Dr PJerline Pain

## 2016-12-24 NOTE — Assessment & Plan Note (Signed)
Well controlled on current regimen. Will continue.

## 2016-12-24 NOTE — Assessment & Plan Note (Signed)
No red flag signs or symptoms. Likely related to sound exposure. Discussed ways to mask symptoms including white noise machine and noise protection. Return precautions reviewed.

## 2016-12-27 ENCOUNTER — Encounter: Payer: Self-pay | Admitting: Family Medicine

## 2017-08-22 ENCOUNTER — Telehealth: Payer: Self-pay | Admitting: Family Medicine

## 2017-08-22 NOTE — Telephone Encounter (Signed)
Noted  

## 2017-08-22 NOTE — Telephone Encounter (Signed)
Chief complaint: sore throat  Reason for call: symptomatic request for health information  Initial comment: caller states has sore throat for 3 days, eyes crusty and voice is leaving, no fever  Nurse: Yolanda Bonine, RN, Sunday Spillers  The patient has new or worsening symptoms.  A triage will be completed.   No related physician visit in the past 2 weeks.  Pt has no chronic conditions.   This is not a behavioral health or substance abuse call.   1 Sore throat with cough/ cold symptoms and 2 present > 5 days  Disp: see PCP within 3 days  Advise given: SEE PCP WITHIN 3 DAYS: you need to be seen within 3 days. Call your doctor during [place] office hours and make an appointment. A clinic or urgent care center are good places to go for care if your doctor's office is closed or you cant get an appointment. NOTE If office will be open tomorrow, tell caller to call then, not in 3 days. CALL BACK IF you become worse. CARE ADVICE GIVEN per sore throat (adult) guideline. DRINK PLENTY LIQUIDS. This is important to prevent dehydration. Cold drinks, popsicles, and milk shakes are especially good. Avoid citrus fruits.Eat a soft diet. Ibuprofen, acetaminophen, pain or fever medicines. Sip warm chicken broth or apple juice. Suck on hard candy or a throat lozenge. Gargle with warm salt water four times a day. To make salt water, put 1/2 teaspoon of salt in 8oz of warm water.  Referred to PCP office   PER [employer].

## 2017-08-22 NOTE — Telephone Encounter (Signed)
Called patient to make an appointment. Patient states he is feeling better and went to a [facility] office over the weekend. Message just for FYI.

## 2017-11-29 ENCOUNTER — Ambulatory Visit: Payer: 59 | Admitting: Family Medicine

## 2017-11-29 ENCOUNTER — Encounter: Payer: Self-pay | Admitting: Family Medicine

## 2017-11-29 VITALS — BP 122/74 | HR 79 | Temp 98.6°F | Ht 76.0 in | Wt 241.6 lb

## 2017-11-29 DIAGNOSIS — R7303 Prediabetes: Secondary | ICD-10-CM | POA: Diagnosis not present

## 2017-11-29 DIAGNOSIS — I10 Essential (primary) hypertension: Secondary | ICD-10-CM

## 2017-11-29 DIAGNOSIS — E785 Hyperlipidemia, unspecified: Secondary | ICD-10-CM

## 2017-11-29 DIAGNOSIS — Z23 Encounter for immunization: Secondary | ICD-10-CM | POA: Diagnosis not present

## 2017-11-29 DIAGNOSIS — Z114 Encounter for screening for human immunodeficiency virus [HIV]: Secondary | ICD-10-CM

## 2017-11-29 DIAGNOSIS — E559 Vitamin D deficiency, unspecified: Secondary | ICD-10-CM

## 2017-11-29 DIAGNOSIS — K5090 Crohn's disease, unspecified, without complications: Secondary | ICD-10-CM

## 2017-11-29 DIAGNOSIS — E538 Deficiency of other specified B group vitamins: Secondary | ICD-10-CM | POA: Diagnosis not present

## 2017-11-29 DIAGNOSIS — Z0001 Encounter for general adult medical examination with abnormal findings: Secondary | ICD-10-CM

## 2017-11-29 LAB — COMPREHENSIVE METABOLIC PANEL WITH GFR
ALT: 44 U/L (ref 0–53)
AST: 21 U/L (ref 0–37)
Albumin: 4.5 g/dL (ref 3.5–5.2)
Alkaline Phosphatase: 67 U/L (ref 39–117)
BUN: 12 mg/dL (ref 6–23)
CO2: 28 meq/L (ref 19–32)
Calcium: 9.7 mg/dL (ref 8.4–10.5)
Chloride: 103 meq/L (ref 96–112)
Creatinine, Ser: 0.99 mg/dL (ref 0.40–1.50)
GFR: 89.75 mL/min
Glucose, Bld: 99 mg/dL (ref 70–99)
Potassium: 3.7 meq/L (ref 3.5–5.1)
Sodium: 139 meq/L (ref 135–145)
Total Bilirubin: 0.4 mg/dL (ref 0.2–1.2)
Total Protein: 7.8 g/dL (ref 6.0–8.3)

## 2017-11-29 LAB — LIPID PANEL
CHOLESTEROL: 170 mg/dL (ref 0–200)
HDL: 34.3 mg/dL — AB (ref >39.00)
NonHDL: 136.06
Total CHOL/HDL Ratio: 5
Triglycerides: 347 mg/dL — ABNORMAL HIGH (ref 0.0–149.0)
VLDL: 69.4 mg/dL — AB (ref 0.0–40.0)

## 2017-11-29 LAB — CBC
HEMATOCRIT: 45.3 % (ref 39.0–52.0)
HEMOGLOBIN: 14.3 g/dL (ref 13.0–17.0)
MCHC: 31.7 g/dL (ref 30.0–36.0)
MCV: 72.5 fl — ABNORMAL LOW (ref 78.0–100.0)
Platelets: 260 10*3/uL (ref 150.0–400.0)
RBC: 6.25 Mil/uL — ABNORMAL HIGH (ref 4.22–5.81)
RDW: 14 % (ref 11.5–15.5)
WBC: 6.7 10*3/uL (ref 4.0–10.5)

## 2017-11-29 LAB — VITAMIN D 25 HYDROXY (VIT D DEFICIENCY, FRACTURES): VITD: 20.03 ng/mL — ABNORMAL LOW (ref 30.00–100.00)

## 2017-11-29 LAB — HEMOGLOBIN A1C: HEMOGLOBIN A1C: 6 % (ref 4.6–6.5)

## 2017-11-29 LAB — LDL CHOLESTEROL, DIRECT: Direct LDL: 111 mg/dL

## 2017-11-29 LAB — VITAMIN B12: Vitamin B-12: 272 pg/mL (ref 211–911)

## 2017-11-29 MED ORDER — AMLODIPINE BESYLATE 10 MG PO TABS: 10.0000 mg | ORAL_TABLET | Freq: Every day | ORAL | 3 refills | Status: DC

## 2017-11-29 MED ORDER — ATORVASTATIN CALCIUM 20 MG PO TABS: 20.0000 mg | ORAL_TABLET | Freq: Every day | ORAL | 3 refills | Status: DC

## 2017-11-29 NOTE — Assessment & Plan Note (Signed)
Check A1c. 

## 2017-11-29 NOTE — Assessment & Plan Note (Signed)
Stable.  Follows with digestive health services.

## 2017-11-29 NOTE — Assessment & Plan Note (Signed)
Continue Lipitor 20 mg daily.  Check lipid panel.

## 2017-11-29 NOTE — Patient Instructions (Addendum)
It was very nice to see you today!  Keep up the good work!  I will send in a refill for your norvasc and lipitor today.  We will give you your flu shot today and check blood work.  Come back to see me in 1 year, or sooner as needed.   Take care, Dr    Preventive Care 18-39 Years, Male Preventive care refers to lifestyle choices and visits with your health care provider that can promote health and wellness. What does preventive care include?  A yearly physical exam. This is also called an annual well check.  Dental exams once or twice a year.  Routine eye exams. Ask your health care provider how often you should have your eyes checked.  Personal lifestyle choices, including: ? Daily care of your teeth and gums. ? Regular physical activity. ? Eating a healthy diet. ? Avoiding tobacco and drug use. ? Limiting alcohol use. ? Practicing safe sex. What happens during an annual well check? The services and screenings done by your health care provider during your annual well check will depend on your age, overall health, lifestyle risk factors, and family history of disease. Counseling Your health care provider may ask you questions about your:  Alcohol use.  Tobacco use.  Drug use.  Emotional well-being.  Home and relationship well-being.  Sexual activity.  Eating habits.  Work and work environment.  Screening You may have the following tests or measurements:  Height, weight, and BMI.  Blood pressure.  Lipid and cholesterol levels. These may be checked every 5 years starting at age 20.  Diabetes screening. This is done by checking your blood sugar (glucose) after you have not eaten for a while (fasting).  Skin check.  Hepatitis C blood test.  Hepatitis B blood test.  Sexually transmitted disease (STD) testing.  Discuss your test results, treatment options, and if necessary, the need for more tests with your health care provider. Vaccines Your  health care provider may recommend certain vaccines, such as:  Influenza vaccine. This is recommended every year.  Tetanus, diphtheria, and acellular pertussis (Tdap, Td) vaccine. You may need a Td booster every 10 years.  Varicella vaccine. You may need this if you have not been vaccinated.  HPV vaccine. If you are 26 or younger, you may need three doses over 6 months.  Measles, mumps, and rubella (MMR) vaccine. You may need at least one dose of MMR.You may also need a second dose.  Pneumococcal 13-valent conjugate (PCV13) vaccine. You may need this if you have certain conditions and have not been vaccinated.  Pneumococcal polysaccharide (PPSV23) vaccine. You may need one or two doses if you smoke cigarettes or if you have certain conditions.  Meningococcal vaccine. One dose is recommended if you are age 19-21 years and a first-year college student living in a residence hall, or if you have one of several medical conditions. You may also need additional booster doses.  Hepatitis A vaccine. You may need this if you have certain conditions or if you travel or work in places where you may be exposed to hepatitis A.  Hepatitis B vaccine. You may need this if you have certain conditions or if you travel or work in places where you may be exposed to hepatitis B.  Haemophilus influenzae type b (Hib) vaccine. You may need this if you have certain risk factors.  Talk to your health care provider about which screenings and vaccines you need and how often you need them.   This information is not intended to replace advice given to you by your health care provider. Make sure you discuss any questions you have with your health care provider. Document Released: 04/06/2001 Document Revised: 10/29/2015 Document Reviewed: 12/10/2014 Elsevier Interactive Patient Education  [employer].

## 2017-11-29 NOTE — Assessment & Plan Note (Signed)
Check vitamin B12 level 

## 2017-11-29 NOTE — Progress Notes (Signed)
Subjective:  Johnny [name] is a 37 y.o. male who presents today for his annual comprehensive physical exam.    HPI:  He has no acute complaints today.   His stable, chronic conditions are outlined below:  1.  Hypertension.  Stable on Norvasc 10 mg daily.  No reported chest pain or shortness of breath. 2.  Hyperlipidemia.  Stable on Lipitor 20 mg daily. 3.  Hyperglycemia.  Currently diet controlled.  No reported polyuria polydipsia.  Lifestyle Diet: Trying to eat a balance diet.  Exercise: No specific exercise.   Depression screen PHQ 2/9 11/29/2017  Decreased Interest 0  Down, Depressed, Hopeless 0  PHQ - 2 Score 0   Health Maintenance Due  Topic Date Due  . HIV Screening  07/21/1994     ROS: Per HPI, otherwise a complete review of systems was negative.   PMH:  The following were reviewed and entered/updated in epic: Past Medical History:  Diagnosis Date  . Crohn's disease ([place])   . GERD (gastroesophageal reflux disease)   . Hyperlipidemia   . Hypertension    Patient Active Problem List   Diagnosis Date Noted  . Vitamin D deficiency 11/29/2017  . Prediabetes 12/24/2016  . Tinnitus 12/24/2016  . Crohn's disease ([place]) 06/11/2016  . Eczema 06/11/2016  . Fatty liver 06/11/2016  . GERD (gastroesophageal reflux disease) 06/11/2016  . IBS (irritable bowel syndrome) 06/11/2016  . Vitamin B12 deficiency 06/11/2016  . Dyslipidemia 05/25/2014  . Benign essential hypertension 10/01/2013  . Metabolic syndrome 49/67/5916   Past Surgical History:  Procedure Laterality Date  . LIVER BIOPSY      History reviewed. No pertinent family history.  Medications- reviewed and updated Current Outpatient Medications  Medication Sig Dispense Refill  . amLODipine (NORVASC) 10 MG tablet Take 1 tablet (10 mg total) by mouth daily. 90 tablet 3  . atorvastatin (LIPITOR) 20 MG tablet Take 1 tablet (20 mg total) by mouth daily at 6 PM. 90 tablet 3  . Cholecalciferol (VITAMIN D3)  2000 units TABS Take by mouth.    . dexlansoprazole (DEXILANT) 60 MG capsule Take by mouth.    . fluticasone (FLONASE) 50 MCG/ACT nasal spray instill 2 sprays into each nostril once daily    . mesalamine (LIALDA) 1.2 g EC tablet Take by mouth.    . triamcinolone cream (KENALOG) 0.5 % Apply topically 2 (two) times daily. 30 g 1   No current facility-administered medications for this visit.     Allergies-reviewed and updated Allergies  Allergen Reactions  . Codeine Nausea Only  . Other     Champagne causes vomiting, diarrhea, and nausea.    Social History   Socioeconomic History  . Marital status: Married    Spouse name: Not on file  . Number of children: Not on file  . Years of education: Not on file  . Highest education level: Not on file  Occupational History  . Not on file  Social Needs  . Financial resource strain: Not on file  . Food insecurity:    Worry: Not on file    Inability: Not on file  . Transportation needs:    Medical: Not on file    Non-medical: Not on file  Tobacco Use  . Smoking status: Never Smoker  . Smokeless tobacco: Never Used  Substance and Sexual Activity  . Alcohol use: No  . Drug use: No  . Sexual activity: Yes  Lifestyle  . Physical activity:    Days per week: Not  on file    Minutes per session: Not on file  . Stress: Not on file  Relationships  . Social connections:    Talks on phone: Not on file    Gets together: Not on file    Attends religious service: Not on file    Active member of club or organization: Not on file    Attends meetings of clubs or organizations: Not on file    Relationship status: Not on file  Other Topics Concern  . Not on file  Social History Narrative  . Not on file    Objective:  Physical Exam: BP 122/74 (BP Location: Left Arm, Patient Position: Sitting, Cuff Size: Normal)   Pulse 79   Temp 98.6 F (37 C) (Oral)   Ht 6' 4"  (1.93 m)   Wt 241 lb 9.6 oz (109.6 kg)   SpO2 97%   BMI 29.41 kg/m     Body mass index is 29.41 kg/m. Wt Readings from Last 3 Encounters:  11/29/17 241 lb 9.6 oz (109.6 kg)  12/24/16 233 lb 12.8 oz (106.1 kg)  06/11/16 232 lb 9.6 oz (105.5 kg)   Gen: NAD, resting comfortably HEENT: TMs normal bilaterally. OP clear. No thyromegaly noted.  CV: RRR with no murmurs appreciated Pulm: NWOB, CTAB with no crackles, wheezes, or rhonchi GI: Normal bowel sounds present. Soft, Nontender, Nondistended. MSK: no edema, cyanosis, or clubbing noted Skin: warm, dry Neuro: CN2-12 grossly intact. Strength 5/5 in upper and lower extremities. Reflexes symmetric and intact bilaterally.  Psych: Normal affect and thought content  Assessment/Plan:  Vitamin D deficiency Check vitamin D level.  Vitamin B12 deficiency Check vitamin B12 level.  Prediabetes Check A1c.  Dyslipidemia Continue Lipitor 20 mg daily.  Check lipid panel.  Crohn's disease ([place]) Stable.  Follows with digestive health services.  Benign essential hypertension Stable.  Continue Norvasc 10 mg daily.  Check CBC and CMET.  Preventative Healthcare: Check HIV antibody.  Flu shot given today.  Patient Counseling(The following topics were reviewed and/or handout was given):  -Nutrition: Stressed importance of moderation in sodium/caffeine intake, saturated fat and cholesterol, caloric balance, sufficient intake of fresh fruits, vegetables, and fiber.  -Stressed the importance of regular exercise.   -Substance Abuse: Discussed cessation/primary prevention of tobacco, alcohol, or other drug use; driving or other dangerous activities under the influence; availability of treatment for abuse.   -Injury prevention: Discussed safety belts, safety helmets, smoke detector, smoking near bedding or upholstery.   -Sexuality: Discussed sexually transmitted diseases, partner selection, use of condoms, avoidance of unintended pregnancy and contraceptive alternatives.   -Dental health: Discussed importance of regular  tooth brushing, flossing, and dental visits.  -Health maintenance and immunizations reviewed. Please refer to Health maintenance section.  Return to care in 1 year for next preventative visit.   Algis Greenhouse. Jerline Pain, MD 11/29/2017 5:05 PM

## 2017-11-29 NOTE — Assessment & Plan Note (Signed)
Check vitamin D level 

## 2017-11-29 NOTE — Assessment & Plan Note (Signed)
Stable.  Continue Norvasc 10 mg daily.  Check CBC and CMET.

## 2017-11-30 LAB — HIV ANTIBODY (ROUTINE TESTING W REFLEX): HIV: NONREACTIVE

## 2017-12-02 NOTE — Progress Notes (Signed)
Please inform patient of the following:  Blood counts are normal.  HIV test is negative.  Electrolytes, kidney function, and liver function levels are normal. B12 level is normal. Blood sugar levels are elevated but stable and a little better than last year.  Vitamin D level is low. Would recommend starting 50,000 IU weekly and rechecking in 3-6 months. We can send this in if he wishes.  Cholesterol levels are about the same as last year also. Would recommend continuing his current dose of statin rechecking next year.   Algis Greenhouse. Jerline Pain, MD 12/02/2017 12:19 PM

## 2017-12-07 ENCOUNTER — Other Ambulatory Visit: Payer: Self-pay

## 2017-12-07 MED ORDER — VITAMIN D (ERGOCALCIFEROL) 1.25 MG (50000 UNIT) PO CAPS: 50000.0000 [IU] | ORAL_CAPSULE | ORAL | 1 refills | Status: DC

## 2018-04-24 ENCOUNTER — Telehealth: Payer: Self-pay | Admitting: Family Medicine

## 2018-04-24 ENCOUNTER — Ambulatory Visit: Payer: Self-pay

## 2018-04-24 MED ORDER — OSELTAMIVIR PHOSPHATE 75 MG PO CAPS: 75.0000 mg | ORAL_CAPSULE | Freq: Every day | ORAL | 0 refills | Status: DC

## 2018-04-24 NOTE — Telephone Encounter (Addendum)
Pt returning call to follow up on request for Tamiflu. Pt states he has called multiple times for request. Prescription for tamiflu sent to pharmacy electronically per previous notes of Dr. Jerline Pain on 04/24/18 at 1619 pm.

## 2018-04-24 NOTE — Telephone Encounter (Signed)
Copied from [place] [id number]. Topic: Quick Communication - See Telephone Encounter >> Apr 24, 2018  5:15 PM Selinda Flavin B, NT wrote: CRM for notification. See Telephone encounter for: 04/24/18.  Please see Nurse Triage from 04/24/2018.  Patient calling and states that he has been trying to get preventative tamiflu sent to the pharmacy due to son testing positive for flu. States that he has called multiple times today. Dr Jerline Pain had given the okay in the telephone encounter, but nothing had been sent to the pharmacy yet. Tried to call office to see if someone could send before closing, no answer. Spoke with Larene Beach to see if by chance nurse triage would be able to send it to the pharmacy if the office doesn't get a chance before closing.

## 2018-04-24 NOTE — Addendum Note (Signed)
Addended by: Dimple Nanas on: 04/24/2018 05:01 PM   Modules accepted: Orders

## 2018-04-24 NOTE — Telephone Encounter (Signed)
See note

## 2018-04-24 NOTE — Telephone Encounter (Signed)
Outgoing  Call to  Patient , who request a  Rx for  Tamiflu be  Called in  To  [facility]  / pharmacy [phone]  [phone].  Patient  Son  Has  The  Flu.  Father  Is  Not  Aware of  What  Strain.  Patient  Did  Not  Report any  Sx.  Call was was  Ended .  Several  Attempts were made to assess if  Any Sx.    Have  Occurred.  No  Answer when called.  Will continue to try to reach  Patient.     Johnny [name] Male, 38 y.o., 04/24/1980 MRN:  [id number] Phone:  [phone] (H) ... PCP:  Vivi Barrack, MD Coverage:  AETNA/AETNA NAP Message from Esaw Dace sent at 04/24/2018 9:18 AM EST   Summary: Flu Exposure   Pt's child has been diagnosed with the flu. He would like to know if he can be sent in a rx for Tamiflu. Please advise.  [id number] [place] [phone](Phone) [phone](Fax)          Call History    Type Contact  04/24/2018 09:17 AM Phone (Incoming) Johnny [name](Self)  Phone: [phone](Jerilynn Mages  User: HVirl AxeD

## 2018-04-24 NOTE — Telephone Encounter (Signed)
Please advise 

## 2018-04-24 NOTE — Telephone Encounter (Signed)
Pt wife son and daughter all have the flue. Pt has no symptoms. Pt stated everyone in the house is on Tamiflu and he is requesting it as well. Pt denies and high risk factors. Pt stated that the antiviral [facility] [address]. Care advice given and pt verbalized understanding.   Reason for Disposition . [1] Influenza EXPOSURE within last 48 hours (2 days) AND [2] NOT HIGH RISK AND [3] strongly requests antiviral medication  Answer Assessment - Initial Assessment Questions 1. TYPE of EXPOSURE: "How were you exposed?" (e.g., close contact, not a close contact)     Wife, son daughter 2. DATE of EXPOSURE: "When did the exposure occur?" (e.g., hour, days, weeks)     Close contact 3. PREGNANCY: "Is there any chance you are pregnant?" "When was your last menstrual period?"     n/a 4. HIGH RISK for COMPLICATIONS: "Do you have any heart or lung problems? Do you have a weakened immune system?" (e.g., CHF, COPD, asthma, HIV positive, chemotherapy, renal failure, diabetes mellitus, sickle cell anemia)     no 5. SYMPTOMS: "Do you have any symptoms?" (e.g., cough, fever, sore throat, difficulty breathing).     no  Protocols used: INFLUENZA EXPOSURE-A-AH

## 2018-04-24 NOTE — Telephone Encounter (Signed)
Ok with prophylactic tamiflu 1m daily for 2 weeks.  Johnny [name] Johnny Pain MD 04/24/2018 4:19 PM

## 2018-04-25 NOTE — Telephone Encounter (Signed)
Rx was sent to pharmacy by nurse triage.

## 2018-04-25 NOTE — Telephone Encounter (Signed)
Rx was sent to pharmacy.

## 2018-05-12 ENCOUNTER — Encounter: Payer: Self-pay | Admitting: Family Medicine

## 2018-06-14 ENCOUNTER — Encounter: Payer: Self-pay | Admitting: Family Medicine

## 2018-06-14 ENCOUNTER — Ambulatory Visit (INDEPENDENT_AMBULATORY_CARE_PROVIDER_SITE_OTHER): Payer: 59 | Admitting: Family Medicine

## 2018-06-14 DIAGNOSIS — M545 Low back pain, unspecified: Secondary | ICD-10-CM

## 2018-06-14 MED ORDER — DICLOFENAC SODIUM 75 MG PO TBEC: 75.0000 mg | DELAYED_RELEASE_TABLET | Freq: Two times a day (BID) | ORAL | 0 refills | Status: DC

## 2018-06-14 MED ORDER — CYCLOBENZAPRINE HCL 10 MG PO TABS: 10.0000 mg | ORAL_TABLET | Freq: Three times a day (TID) | ORAL | 0 refills | Status: DC | PRN

## 2018-06-14 NOTE — Progress Notes (Signed)
    Chief Complaint:  Johnny [name] is a 38 y.o. male who presents today for a virtual office visit with a chief complaint of back pain.   Assessment/Plan:  Low Back Pain No red flags.  Likely muscular strain.  Start diclofenac 75 mg twice daily for 14 days and Flexeril 10 mg 3 times daily as needed.  Discussed home exercise program and handout was given.  Discussed reasons to return to care.  Follow-up as needed.  If not improving over the next 5 to 7 days, will need imaging and/or referral to sports med.    Subjective:  HPI:  Back Pain  Symptoms started about 2 months ago.  States that he was lifting objects from a house when he noticed the pain.  Pain is located mid lower back.  Occasionally radiates into his hips.  No lower extremity weakness or numbness.  No reported bowel or bladder incontinence.  Heating pad helps.  Ibuprofen helps modestly.  No other obvious alleviating or aggravating factors.  Overall symptoms are stable.  ROS: Per HPI  PMH: He reports that he has never smoked. He has never used smokeless tobacco. He reports that he does not drink alcohol or use drugs.      Objective/Observations  Physical Exam: Gen: NAD, resting comfortably Pulm: Normal work of breathing Neuro: Grossly normal, moves all extremities Psych: Normal affect and thought content MSK: Moves lower extremities normally.  Virtual Visit via Video   I connected with Johnny [name] on 06/14/18 at 10:00 AM EDT by a video enabled telemedicine application and verified that I am speaking with the correct person using two identifiers. I discussed the limitations of evaluation and management by telemedicine and the availability of in person appointments. The patient expressed understanding and agreed to proceed.   Patient location: Home Provider location: [place] participating in the virtual visit: Myself and Patient     Algis Greenhouse. Jerline Pain, MD 06/14/2018 9:49 AM

## 2018-06-22 ENCOUNTER — Ambulatory Visit (INDEPENDENT_AMBULATORY_CARE_PROVIDER_SITE_OTHER): Payer: 59 | Admitting: Family Medicine

## 2018-06-22 DIAGNOSIS — L309 Dermatitis, unspecified: Secondary | ICD-10-CM | POA: Diagnosis not present

## 2018-06-22 MED ORDER — PREDNISONE 50 MG PO TABS: ORAL_TABLET | ORAL | 0 refills | Status: DC

## 2018-06-22 MED ORDER — TRIAMCINOLONE ACETONIDE 0.5 % EX CREA: TOPICAL_CREAM | Freq: Two times a day (BID) | CUTANEOUS | 1 refills | Status: DC

## 2018-06-22 MED ORDER — CYCLOBENZAPRINE HCL 10 MG PO TABS: 10.0000 mg | ORAL_TABLET | Freq: Three times a day (TID) | ORAL | 0 refills | Status: DC | PRN

## 2018-06-22 MED ORDER — DICLOFENAC SODIUM 75 MG PO TBEC: 75.0000 mg | DELAYED_RELEASE_TABLET | Freq: Two times a day (BID) | ORAL | 0 refills | Status: DC

## 2018-06-22 NOTE — Progress Notes (Signed)
    Chief Complaint:  Johnny [name] is a 38 y.o. male who presents today for a virtual office visit with a chief complaint of low back pain.   Assessment/Plan:  Low Back Pain No red flags.  Likely reinjured muscular strain from few weeks ago.  Will start short course of prednisone.  Continue diclofenac and Flexeril.  Continue heating pad and home exercises.  Discussed reasons to return to care.  Follow-up as needed.  Eczema Mild flare.  Refill triamcinolone.    Subjective:  HPI:  Back Pain Patient fell this morning and injured his low back. States that he was walking outside when he slipped on wet wood and fell straight onto his bottom.  Since then has had low back pain.  A week ago he was started on diclofenac and Flexeril for low back pain.  Symptoms have improved modestly with this however symptoms are now much worse.  Worse with coughing.  Worse with walking.  Has not tried any thing else specifically.  No other obvious alleviating or aggravating factors.  Eczema Chronic problem.  Has had a mild flare due to frequent use of hand sanitizer and soap.  Needs refill on triamcinolone.  ROS: Per HPI  PMH: He reports that he has never smoked. He has never used smokeless tobacco. He reports that he does not drink alcohol or use drugs.      Objective/Observations  Physical Exam: Gen: NAD, resting comfortably Pulm: Normal work of breathing Neuro: Grossly normal, moves all extremities Psych: Normal affect and thought content  Virtual Visit via Video   I connected with [name] on 06/22/18 at  9:00 AM EDT by a video enabled telemedicine application and verified that I am speaking with the correct person using two identifiers. I discussed the limitations of evaluation and management by telemedicine and the availability of in person appointments. The patient expressed understanding and agreed to proceed.   Patient location: Home Provider location: [place] participating in the virtual visit: Myself and Patient     Algis Greenhouse. Jerline Pain, MD 06/22/2018 10:17 AM

## 2018-06-22 NOTE — Assessment & Plan Note (Signed)
Mild flare.  Refill triamcinolone.

## 2018-07-06 ENCOUNTER — Encounter: Payer: Self-pay | Admitting: Family Medicine

## 2018-07-07 ENCOUNTER — Other Ambulatory Visit: Payer: Self-pay

## 2018-07-07 MED ORDER — CYCLOBENZAPRINE HCL 10 MG PO TABS: 10.0000 mg | ORAL_TABLET | Freq: Three times a day (TID) | ORAL | 0 refills | Status: DC | PRN

## 2018-07-20 ENCOUNTER — Other Ambulatory Visit: Payer: Self-pay

## 2018-07-20 ENCOUNTER — Encounter: Payer: Self-pay | Admitting: Family Medicine

## 2018-07-20 DIAGNOSIS — M545 Low back pain, unspecified: Secondary | ICD-10-CM

## 2018-07-20 MED ORDER — DICLOFENAC SODIUM 75 MG PO TBEC: 75.0000 mg | DELAYED_RELEASE_TABLET | Freq: Two times a day (BID) | ORAL | 0 refills | Status: DC

## 2018-07-20 MED ORDER — CYCLOBENZAPRINE HCL 10 MG PO TABS: 10.0000 mg | ORAL_TABLET | Freq: Three times a day (TID) | ORAL | 0 refills | Status: DC | PRN

## 2018-07-26 NOTE — Progress Notes (Signed)
[name] [address], [place] [address] Phone: [phone] Subjective:   Johnny [name], am serving as a scribe for Dr. Hulan Saas.  I'm seeing this patient by the request  of:  Vivi Barrack, MD   CC: Low back pain  HEN:[id number]  Johnny [name] is a 38 y.o. male coming in with complaint of mid to low back pain. 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 Has been waiting to come in due to covid. Did use mm relaxer and anti inflammatories which did help. Slipped recently on some wet wood and his pain increased. Pain is midline. Feels stiffness with achy pain. Pain is intermittent. Pain increases with using his arms and with sit to stand. Has tried some stretching.      Past Medical History:  Diagnosis Date  . Crohn's disease ([place])   . GERD (gastroesophageal reflux disease)   . Hyperlipidemia   . Hypertension    Past Surgical History:  Procedure Laterality Date  . LIVER BIOPSY     Social History   Socioeconomic History  . Marital status: Married    Spouse name: Not on file  . Number of children: Not on file  . Years of education: Not on file  . Highest education level: Not on file  Occupational History  . Not on file  Social Needs  . Financial resource strain: Not on file  . Food insecurity:    Worry: Not on file    Inability: Not on file  . Transportation needs:    Medical: Not on file    Non-medical: Not on file  Tobacco Use  . Smoking status: Never Smoker  . Smokeless tobacco: Never Used  Substance and Sexual Activity  . Alcohol use: [name]  . Drug use: [name]  . Sexual activity: Yes  Lifestyle  . Physical activity:    Days per week: Not on file    Minutes per session: Not on file  . Stress: Not on file  Relationships  . Social connections:    Talks on phone: Not on file    Gets together: Not on file    Attends religious service: Not on file    Active member of club or  organization: Not on file    Attends meetings of clubs or organizations: Not on file    Relationship status: Not on file  Other Topics Concern  . Not on file  Social History Narrative  . Not on file   Allergies  Allergen Reactions  . Codeine Nausea Only  . Other     Champagne causes vomiting, diarrhea, and nausea.   [name] family history on file.  Current Outpatient Medications (Endocrine & Metabolic):  .  predniSONE (DELTASONE) 50 MG tablet, Take 1 tablet daily for 5 days. Then take 1/2 tablet daily for 2 days.  Current Outpatient Medications (Cardiovascular):  .  amLODipine (NORVASC) 10 MG tablet, Take 1 tablet (10 mg total) by mouth daily. [name] Kitchen  atorvastatin (LIPITOR) 20 MG tablet, Take 1 tablet (20 mg total) by mouth daily at 6 PM.  Current Outpatient Medications (Respiratory):  .  fluticasone (FLONASE) 50 MCG/ACT nasal spray, instill 2 sprays into each nostril once daily  Current Outpatient Medications (Analgesics):  .  diclofenac (VOLTAREN) 75 MG EC tablet, Take 1 tablet (75 mg total) by mouth 2 (two) times daily.   Current Outpatient Medications (Other):  [name] Kitchen  Cholecalciferol (VITAMIN D3)  2000 units TABS, Take by mouth. .  cyclobenzaprine (FLEXERIL) 10 MG tablet, Take 1 tablet (10 mg total) by mouth 3 (three) times daily as needed for muscle spasms. [name] Kitchen  dexlansoprazole (DEXILANT) 60 MG capsule, Take by mouth. .  mesalamine (LIALDA) 1.2 g EC tablet, Take by mouth. .  triamcinolone cream (KENALOG) 0.5 %, Apply topically 2 (two) times daily. .  Vitamin D, Ergocalciferol, (DRISDOL) 50000 units CAPS capsule, Take 1 capsule (50,000 Units total) by mouth every 7 (seven) days. .  Diclofenac Sodium 2 % SOLN, Place 2 g onto the skin 2 (two) times daily. [name] Kitchen  gabapentin (NEURONTIN) 100 MG capsule, Take 2 capsules (200 mg total) by mouth at bedtime.    Past medical history, social, surgical and family history all reviewed in electronic medical record.  [name] information unless stated  regarding to the chief complaint.   Review of Systems:  [name], visual changes, nausea, vomiting, diarrhea, constipation, dizziness, abdominal pain, skin rash, fevers, chills, night sweats, weight loss, swollen lymph nodes, body aches, joint swelling, muscle aches, chest pain, shortness of breath, mood changes.   Objective  Blood pressure (!) 118/94, pulse 94, height 6' 4"  (1.93 m), weight 242 lb (109.8 kg), SpO2 98 %.   General: [name] apparent distress alert and oriented x3 mood and affect normal, dressed appropriately.  HEENT: Pupils equal, extraocular movements intact  Respiratory: Patient's speak in full sentences and does not appear short of breath  Cardiovascular: [name] lower extremity edema, non tender, [name] erythema  Skin: Warm dry intact with [name] signs of infection or rash on extremities or on axial skeleton.  Abdomen: Soft nontender  Neuro: Cranial nerves II through XII are intact, neurovascularly intact in all extremities with 2+ DTRs and 2+ pulses.  Lymph: [name] lymphadenopathy of posterior or anterior cervical chain or axillae bilaterally.  Gait normal with good balance and coordination.  MSK:  Non tender with full range of motion and good stability and symmetric strength and tone of shoulders, elbows, wrist, hip, knee and ankles bilaterally.  Patient's back exam shows that patient does have some mild increased lordosis at the L5-S1 area patient is tender to palpation in the L4-L5 area on the paraspinal musculature of the left side.  Tightness of the hip flexors noted but negative straight leg test.  Mild tightness of Faber test.  Neurovascular intact distally.  5 out of 5 strength.  Near full range of motion but does lacks last 2 degrees of extension.  Poor core strength  [id number]; 15 additional minutes spent for Therapeutic exercises as stated in above notes.  This included exercises focusing on stretching, strengthening, with significant focus on eccentric aspects.   Long term goals include  an improvement in range of motion, strength, endurance as well as avoiding reinjury. Patient's frequency would include in 1-2 times a day, 3-5 times a week for a duration of 6-12 weeks. Low back exercises that included:  Pelvic tilt/bracing instruction to focus on control of the pelvic girdle and lower abdominal muscles  Glute strengthening exercises, focusing on proper firing of the glutes without engaging the low back muscles Proper stretching techniques for maximum relief for the hamstrings, hip flexors, low back and some rotation where tolerated   Proper technique shown and discussed handout in great detail with ATC.  All questions were discussed and answered.   Impression and Recommendations:     This case required medical decision making of moderate complexity. The above documentation has been reviewed and is accurate  and complete Johnny Pulley, DO       Note: This dictation was prepared with Dragon dictation along with smaller phrase technology. Any transcriptional errors that result from this process are unintentional.

## 2018-07-27 ENCOUNTER — Ambulatory Visit (INDEPENDENT_AMBULATORY_CARE_PROVIDER_SITE_OTHER)
Admission: RE | Admit: 2018-07-27 | Discharge: 2018-07-27 | Disposition: A | Discharge status: Home / Self Care | Payer: 59 | Source: Ambulatory Visit | Attending: Family Medicine | Admitting: Family Medicine

## 2018-07-27 ENCOUNTER — Encounter: Payer: Self-pay | Admitting: Family Medicine

## 2018-07-27 ENCOUNTER — Ambulatory Visit: Payer: 59 | Admitting: Family Medicine

## 2018-07-27 ENCOUNTER — Other Ambulatory Visit: Payer: Self-pay

## 2018-07-27 VITALS — BP 118/94 | HR 94 | Ht 76.0 in | Wt 242.0 lb

## 2018-07-27 DIAGNOSIS — G8929 Other chronic pain: Secondary | ICD-10-CM | POA: Diagnosis not present

## 2018-07-27 DIAGNOSIS — M545 Low back pain, unspecified: Secondary | ICD-10-CM | POA: Insufficient documentation

## 2018-07-27 IMAGING — DX LUMBAR SPINE FLEX AND EXTEND ONLY - 2-3 VIEW
3 series · 3 of 3 positions shown · non-contrast
Comparison: [DATE] CT

CLINICAL DATA: Low back pain.  Fall 2 months ago.

EXAM:
LUMBAR SPINE FLEX AND EXTEND ONLY - 2-3 VIEW

[l-spine lat]
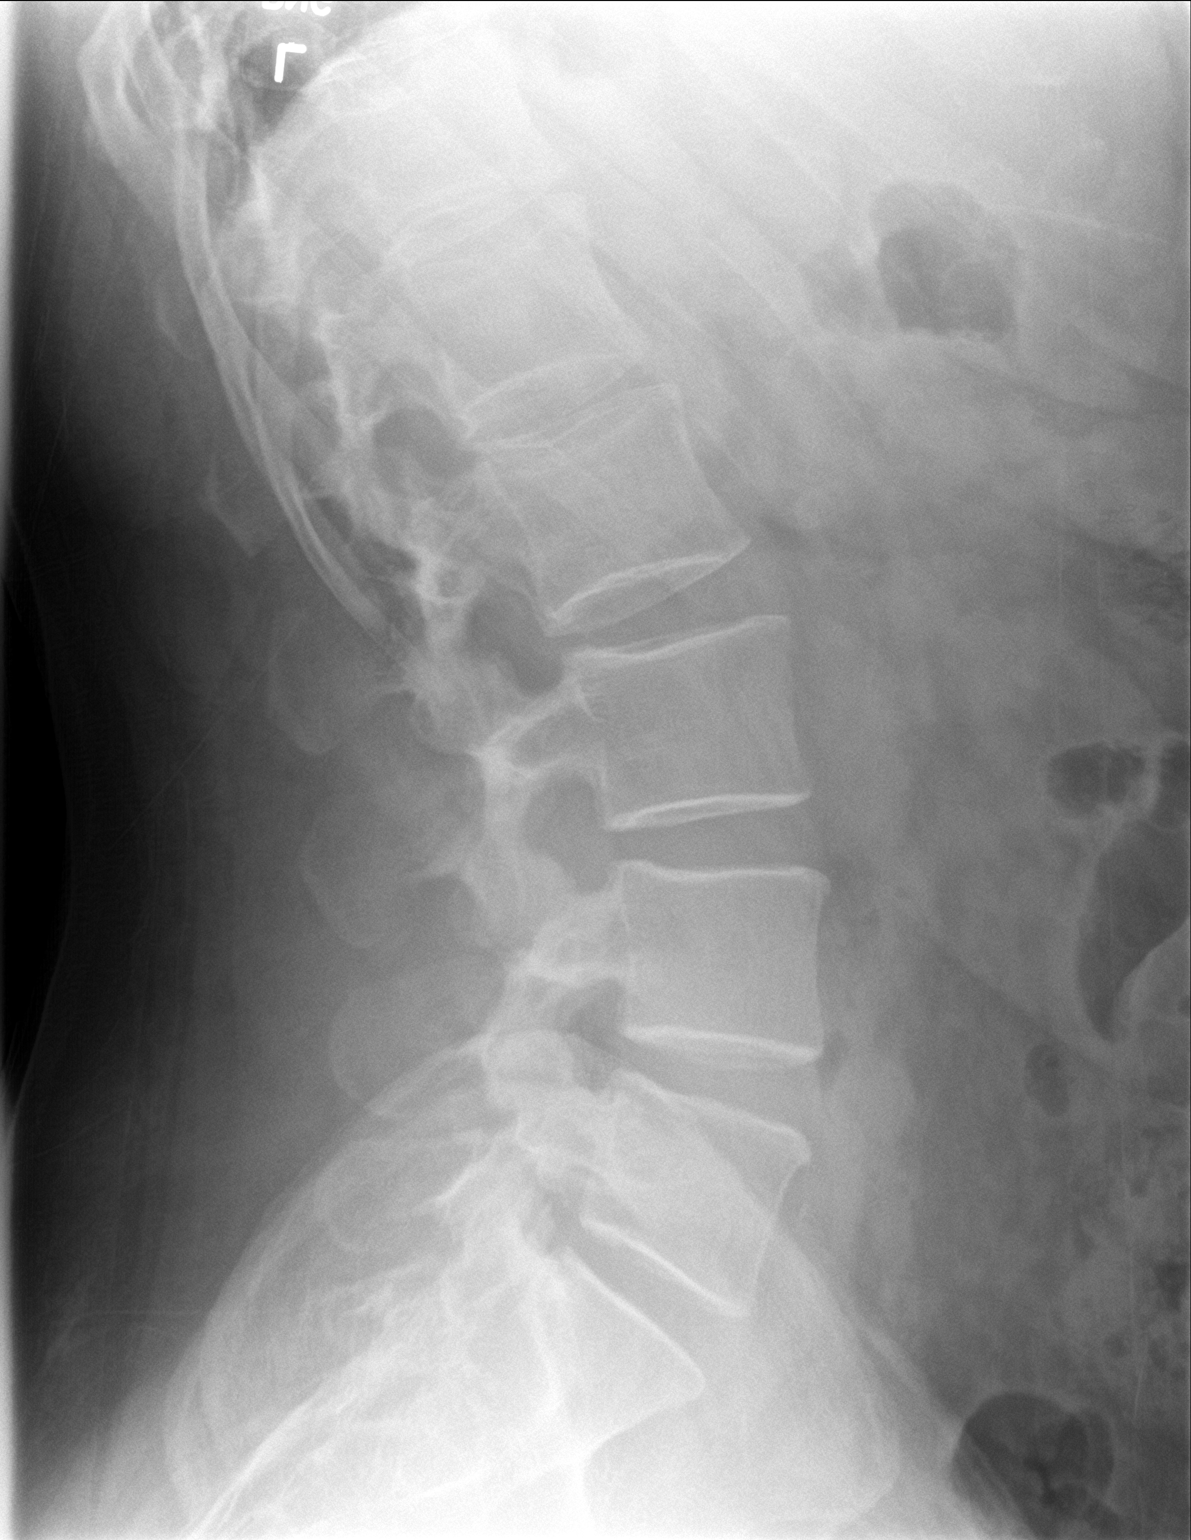

[l-spine flex]
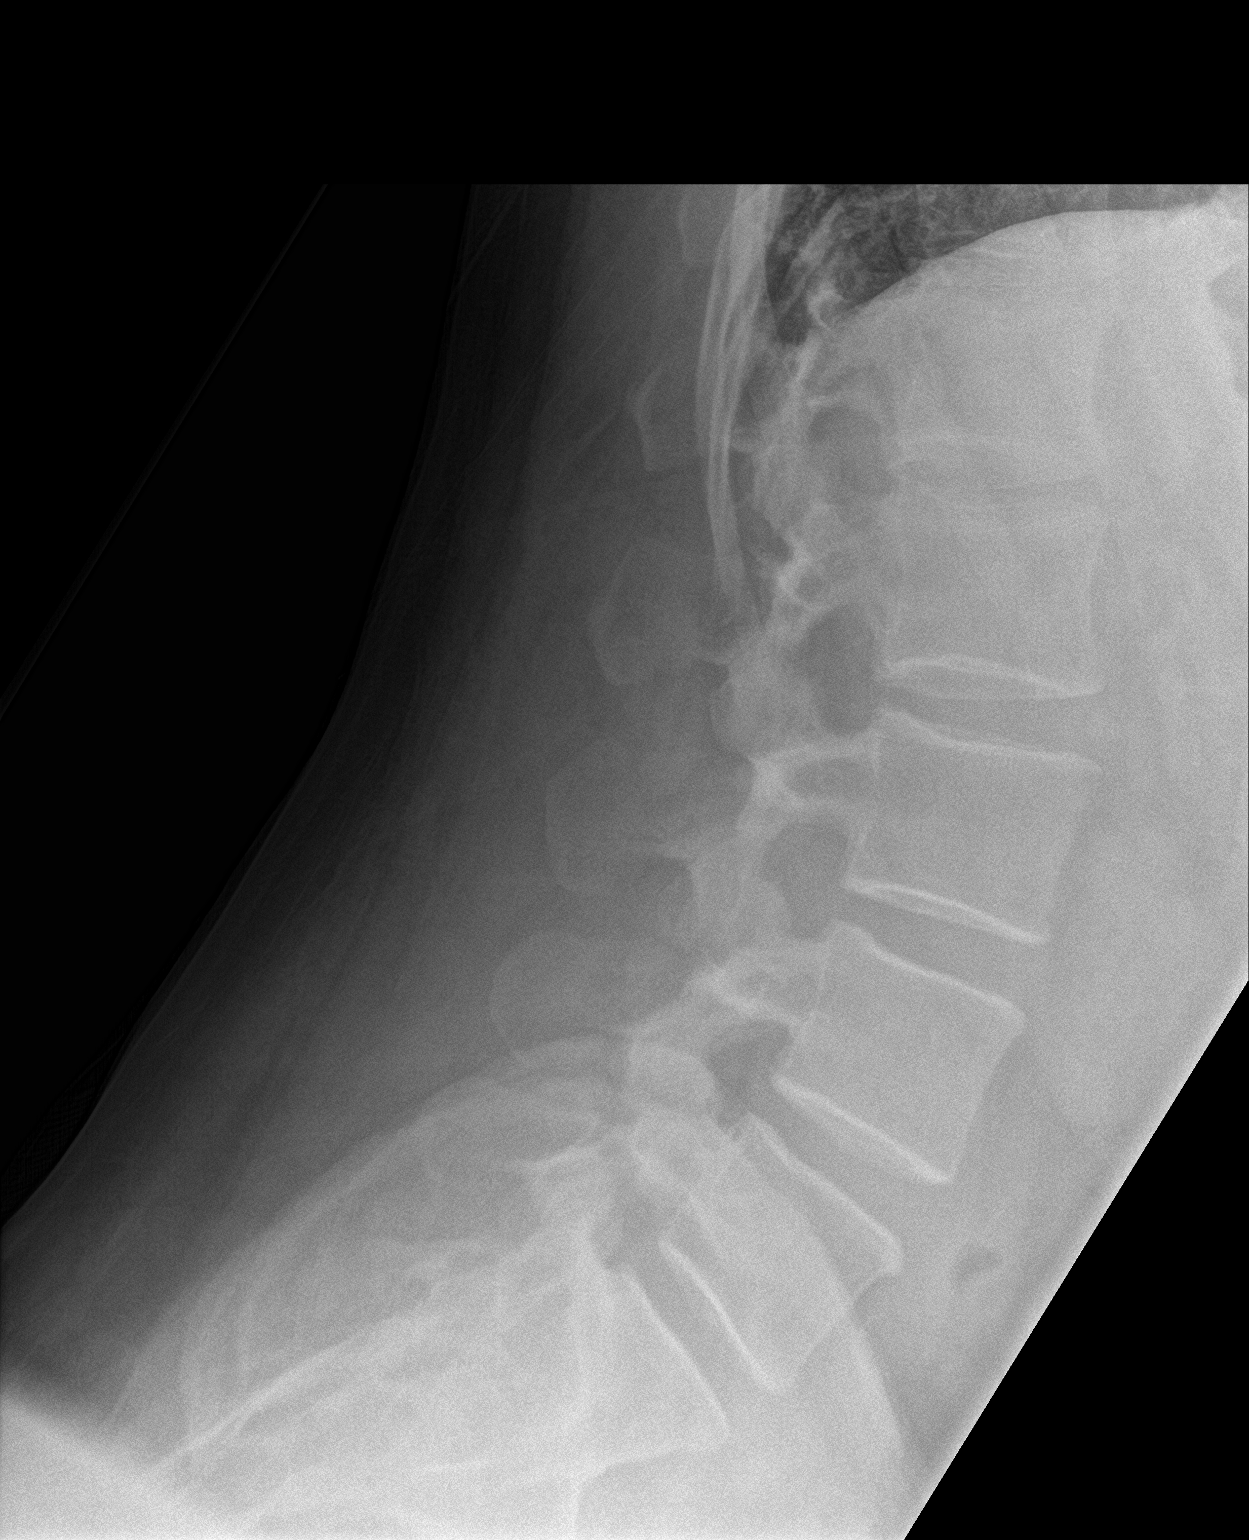

[l-spine ext]
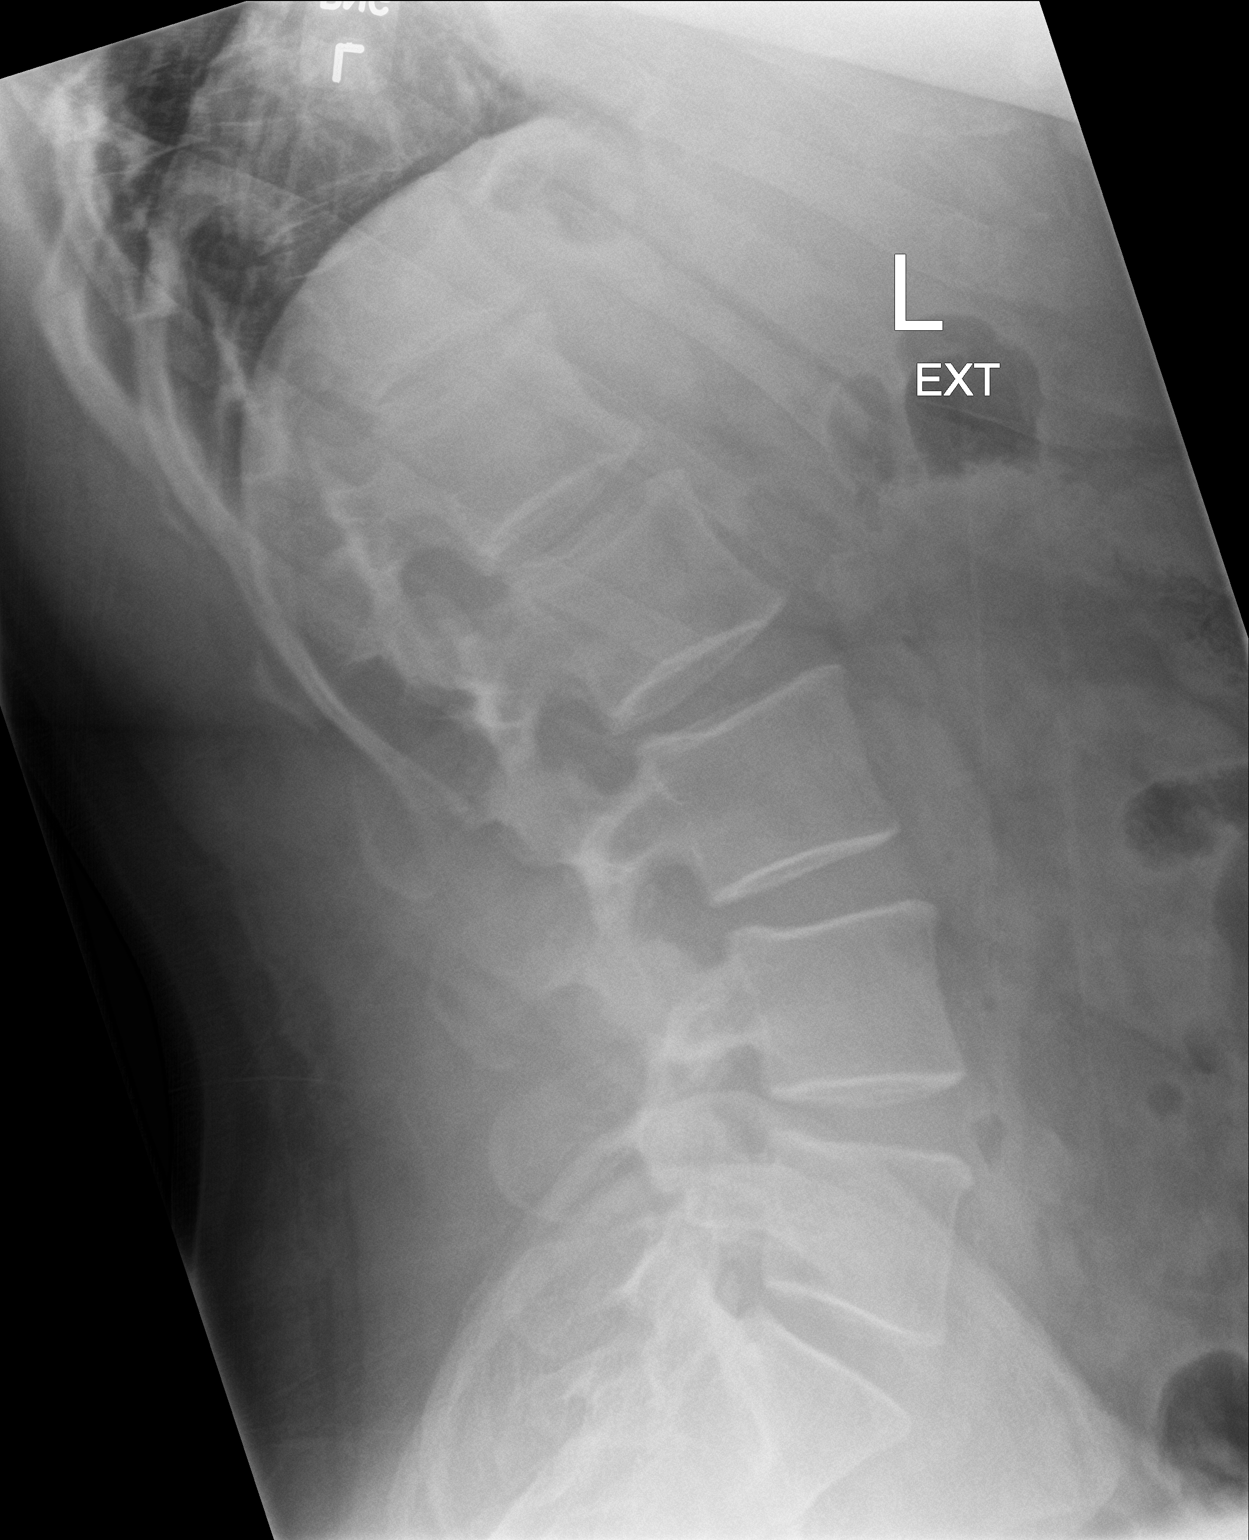

[3 of 3 positions shown; findings below may reference images not displayed]

FINDINGS: Normal alignment. No fracture. Disc spaces maintained. No
instability with flexion or extension.
IMPRESSION: No acute bony abnormality.

## 2018-07-27 MED ORDER — DICLOFENAC SODIUM 2 % TD SOLN: 2.0000 g | Freq: Two times a day (BID) | TRANSDERMAL | 3 refills | Status: DC

## 2018-07-27 MED ORDER — GABAPENTIN 100 MG PO CAPS: 200.0000 mg | ORAL_CAPSULE | Freq: Every day | ORAL | 3 refills | Status: DC

## 2018-07-27 NOTE — Patient Instructions (Signed)
Ice 20 minutes at the end of the day Pennsaid twice daily as needed, finger tip sized amount See me again in 4 weeks

## 2018-07-27 NOTE — Assessment & Plan Note (Signed)
Patient is having low back pain.  Intermittent radicular symptoms.  Discussed with patient in great length.  We discussed home exercises, icing regimen, which activities of doing which was to avoid.  I do believe that core strength is playing a role and we discussed isometric exercises.  Past medical history is significant for Crohn's disease so we will get x-rays to rule out ankylosing spondylitis but do not feel that further laboratory work-up is necessary.  Patient will come back again in 4 weeks and depending on how patient responds we will consider the possibility of osteopathic manipulation.  Prescriptions included gabapentin as well as vitamin D.  Follow-up in 4 weeks

## 2018-08-11 ENCOUNTER — Telehealth: Payer: Self-pay | Admitting: Emergency Medicine

## 2018-08-11 MED ORDER — DICLOFENAC SODIUM 2 % TD SOLN: 2.0000 g | Freq: Two times a day (BID) | TRANSDERMAL | 3 refills | Status: DC

## 2018-08-11 NOTE — Telephone Encounter (Signed)
Re-sent rx into pharmacy.  Pt made aware.

## 2018-08-11 NOTE — Telephone Encounter (Signed)
Patient had a visit on 6/4 with Dr Tamala Julian and was advised someone would be reaching out to him regarding a cream that would be shipped to him. He said that he has not heard from anyone

## 2018-08-12 ENCOUNTER — Other Ambulatory Visit: Payer: Self-pay | Admitting: Family Medicine

## 2018-08-18 ENCOUNTER — Other Ambulatory Visit: Payer: Self-pay | Admitting: Family Medicine

## 2018-08-18 ENCOUNTER — Encounter: Payer: Self-pay | Admitting: Family Medicine

## 2018-08-23 NOTE — Progress Notes (Signed)
[name] [address], [place] [address] Phone: [phone] Subjective:   Johnny [name], am serving as a scribe for Dr. Hulan Saas.  I'm seeing this patient by the request  of:    CC: Back pain follow-up  [id number]   07/27/2018: Patient is having low back pain.  Intermittent radicular symptoms.  Discussed with patient in great length.  We discussed home exercises, icing regimen, which activities of doing which was to avoid.  I do believe that core strength is playing a role and we discussed isometric exercises.  Past medical history is significant for Crohn's disease so we will get x-rays to rule out ankylosing spondylitis but do not feel that further laboratory work-up is necessary.  Patient will come back again in 4 weeks and depending on how patient responds we will consider the possibility of osteopathic manipulation.  Prescriptions included gabapentin as well as vitamin D.  Follow-up in 4 weeks  Update 08/24/2018: Johnny [name] is a 38 y.o. male coming in with complaint of back pain. Complains of an achy pain. Less stiffness. Twisting and forward bending increase his pain. Installed a printer at the house and caused increase in pain one week ago. Pain over spine lower thoracic and upper lumbar. Has been trying to work on [employer]. Is unsure if they are helping. Has pain with sidelying hip abduction exercise since printer injury.  Patient has [name] tenderness.  Nontender found on 45% better.  [name] longer taking the vitamin D.     Past Medical History:  Diagnosis Date  . Crohn's disease ([place])   . GERD (gastroesophageal reflux disease)   . Hyperlipidemia   . Hypertension    Past Surgical History:  Procedure Laterality Date  . LIVER BIOPSY     Social History   Socioeconomic History  . Marital status: Married    Spouse name: Not on file  . Number of children: Not on file  . Years of education: Not on file  . Highest education level: Not on  file  Occupational History  . Not on file  Social Needs  . Financial resource strain: Not on file  . Food insecurity    Worry: Not on file    Inability: Not on file  . Transportation needs    Medical: Not on file    Non-medical: Not on file  Tobacco Use  . Smoking status: Never Smoker  . Smokeless tobacco: Never Used  Substance and Sexual Activity  . Alcohol use: [name]  . Drug use: [name]  . Sexual activity: Yes  Lifestyle  . Physical activity    Days per week: Not on file    Minutes per session: Not on file  . Stress: Not on file  Relationships  . Social Herbalist on phone: Not on file    Gets together: Not on file    Attends religious service: Not on file    Active member of club or organization: Not on file    Attends meetings of clubs or organizations: Not on file    Relationship status: Not on file  Other Topics Concern  . Not on file  Social History Narrative  . Not on file   Allergies  Allergen Reactions  . Codeine Nausea Only  . Other     Champagne causes vomiting, diarrhea, and nausea.   [name] family history on file.  Current Outpatient Medications (Endocrine & Metabolic):  .  predniSONE (DELTASONE) 50  MG tablet, Take 1 tablet daily for 5 days. Then take 1/2 tablet daily for 2 days.  Current Outpatient Medications (Cardiovascular):  .  amLODipine (NORVASC) 10 MG tablet, Take 1 tablet (10 mg total) by mouth daily. [name] Kitchen  atorvastatin (LIPITOR) 20 MG tablet, Take 1 tablet (20 mg total) by mouth daily at 6 PM.  Current Outpatient Medications (Respiratory):  .  fluticasone (FLONASE) 50 MCG/ACT nasal spray, instill 2 sprays into each nostril once daily  Current Outpatient Medications (Analgesics):  .  diclofenac (VOLTAREN) 75 MG EC tablet, TAKE 1 TABLET BY MOUTH TWICE A DAY   Current Outpatient Medications (Other):  [name] Kitchen  Cholecalciferol (VITAMIN D3) 2000 units TABS, Take by mouth. .  cyclobenzaprine (FLEXERIL) 10 MG tablet, Take 1 tablet (10 mg total) by  mouth 3 (three) times daily as needed for muscle spasms. [name] Kitchen  dexlansoprazole (DEXILANT) 60 MG capsule, Take by mouth. .  Diclofenac Sodium 2 % SOLN, Place 2 g onto the skin 2 (two) times daily. [name] Kitchen  gabapentin (NEURONTIN) 100 MG capsule, TAKE 2 CAPSULES (200 MG TOTAL) BY MOUTH AT BEDTIME. .  mesalamine (LIALDA) 1.2 g EC tablet, Take by mouth. .  triamcinolone cream (KENALOG) 0.5 %, Apply topically 2 (two) times daily. .  Vitamin D, Ergocalciferol, (DRISDOL) 50000 units CAPS capsule, Take 1 capsule (50,000 Units total) by mouth every 7 (seven) days. .  Vitamin D, Ergocalciferol, (DRISDOL) 1.25 MG (50000 UT) CAPS capsule, Take 1 capsule (50,000 Units total) by mouth every 7 (seven) days.    Past medical history, social, surgical and family history all reviewed in electronic medical record.  [name] information unless stated regarding to the chief complaint.   Review of Systems:  [name], visual changes, nausea, vomiting, diarrhea, constipation, dizziness, abdominal pain, skin rash, fevers, chills, night sweats, weight loss, swollen lymph nodes, body aches, joint swelling, chest pain, shortness of breath, mood changes.  Mild positive muscle aches  Objective  Blood pressure 112/82, pulse 78, height 6' 4"  (1.93 m), weight 248 lb (112.5 kg), SpO2 98 %.    General: [name] apparent distress alert and oriented x3 mood and affect normal, dressed appropriately.  HEENT: Pupils equal, extraocular movements intact  Respiratory: Patient's speak in full sentences and does not appear short of breath  Cardiovascular: [name] lower extremity edema, non tender, [name] erythema  Skin: Warm dry intact with [name] signs of infection or rash on extremities or on axial skeleton.  Abdomen: Soft nontender  Neuro: Cranial nerves II through XII are intact, neurovascularly intact in all extremities with 2+ DTRs and 2+ pulses.  Lymph: [name] lymphadenopathy of posterior or anterior cervical chain or axillae bilaterally.  Gait normal  with good balance and coordination.  MSK:  Non tender with full range of motion and good stability and symmetric strength and tone of shoulders, elbows, wrist, hip, knee and ankles bilaterally.  Back Exam:  Inspection: Mild loss of lordosis Motion: Flexion 45 deg, Extension 25 deg, Side Bending to 35 deg bilaterally, Rotation to 35 deg bilaterally  SLR laying: Negative  XSLR laying: Negative  Palpable tenderness: Tender to palpation of paraspinal musculature lumbar spine right greater than. FABER: negative. Sensory change: Gross sensation intact to all lumbar and sacral dermatomes.  Reflexes: 2+ at both patellar tendons, 2+ at achilles tendons, Babinski's downgoing.  Strength at foot  Plantar-flexion: 5/5 Dorsi-flexion: 5/5 Eversion: 5/5 Inversion: 5/5  Leg strength  Quad: 5/5 Hamstring: 5/5 Hip flexor: 5/5 Hip abductors: 5/5    Osteopathic findings  T8 extended rotated and side bent left L2 flexed rotated and side bent right Sacrum right on right    Impression and Recommendations:     This case required medical decision making of moderate complexity. The above documentation has been reviewed and is accurate and complete [name], DO       Note: This dictation was prepared with Dragon dictation along with smaller phrase technology. Any transcriptional errors that result from this process are unintentional.

## 2018-08-24 ENCOUNTER — Other Ambulatory Visit: Payer: Self-pay

## 2018-08-24 ENCOUNTER — Encounter: Payer: Self-pay | Admitting: Family Medicine

## 2018-08-24 ENCOUNTER — Ambulatory Visit: Payer: 59 | Admitting: Family Medicine

## 2018-08-24 DIAGNOSIS — M545 Low back pain: Secondary | ICD-10-CM

## 2018-08-24 DIAGNOSIS — M999 Biomechanical lesion, unspecified: Secondary | ICD-10-CM | POA: Diagnosis not present

## 2018-08-24 DIAGNOSIS — G8929 Other chronic pain: Secondary | ICD-10-CM

## 2018-08-24 MED ORDER — VITAMIN D (ERGOCALCIFEROL) 1.25 MG (50000 UNIT) PO CAPS: 50000.0000 [IU] | ORAL_CAPSULE | ORAL | 0 refills | Status: DC

## 2018-08-24 NOTE — Patient Instructions (Signed)
Gabapentin during the day Vitamin d once a week Tried manipulation See me again in 4 weeks

## 2018-08-24 NOTE — Assessment & Plan Note (Signed)
Mechanical in nature.  Patient's ulcerative colitis or Crohn's disease could be contributing as well.  We discussed the vitamin D deficiency and encouraged him to continue the once weekly vitamin D for at least the next 3 months.  Refills given today.  Discussed posture and ergonomics.  Attempted osteopathic manipulation.  Follow-up again in 4 weeks

## 2018-08-24 NOTE — Assessment & Plan Note (Signed)
Decision today to treat with OMT was based on Physical Exam  After verbal consent patient was treated with HVLA, ME, FPR techniques in  thoracic, lumbar and sacral areas  Patient tolerated the procedure well with improvement in symptoms  Patient given exercises, stretches and lifestyle modifications  See medications in patient instructions if given  Patient will follow up in 4-8 weeks

## 2018-09-21 ENCOUNTER — Other Ambulatory Visit: Payer: Self-pay

## 2018-09-21 ENCOUNTER — Encounter: Payer: Self-pay | Admitting: Family Medicine

## 2018-09-21 ENCOUNTER — Ambulatory Visit (INDEPENDENT_AMBULATORY_CARE_PROVIDER_SITE_OTHER): Payer: Managed Care, Other (non HMO) | Admitting: Family Medicine

## 2018-09-21 VITALS — BP 130/70 | HR 75 | Ht 76.0 in | Wt 249.0 lb

## 2018-09-21 DIAGNOSIS — M545 Low back pain, unspecified: Secondary | ICD-10-CM

## 2018-09-21 DIAGNOSIS — G8929 Other chronic pain: Secondary | ICD-10-CM | POA: Diagnosis not present

## 2018-09-21 DIAGNOSIS — M999 Biomechanical lesion, unspecified: Secondary | ICD-10-CM | POA: Diagnosis not present

## 2018-09-21 NOTE — Assessment & Plan Note (Signed)
Decision today to treat with OMT was based on Physical Exam  After verbal consent patient was treated with HVLA, ME, FPR techniques in  thoracic, lumbar and sacral areas  Patient tolerated the procedure well with improvement in symptoms  Patient given exercises, stretches and lifestyle modifications  See medications in patient instructions if given  Patient will follow up in 4-8 weeks

## 2018-09-21 NOTE — Assessment & Plan Note (Signed)
Stable, mild improvement.  Discussed return to residual which was to avoid.  Discussed posture and ergonomics.  Discussed hip abductor strengthening.  Follow-up again in 4 to 8 weeks

## 2018-09-21 NOTE — Patient Instructions (Signed)
Keep working on core It will take time  See me again in 5-6 weeks

## 2018-09-21 NOTE — Progress Notes (Signed)
[name] [address], [place] [address] Phone: [phone] Subjective:   I Johnny [name] am serving as a Education administrator for Dr. Hulan Saas.  Back pain  CC: Back pain follow-up  [name]  Johnny [name] is a 38 y.o. male coming in with complaint of back pain. States that he is feeling a little better. Some days he experiences pain and has been limiting his movements.  Patient states that he sits too long and does some other activities too long might have increasing discomfort.  No radiation down the legs no numbness or tingling.  No weakness     Past Medical History:  Diagnosis Date  . Crohn's disease ([place])   . GERD (gastroesophageal reflux disease)   . Hyperlipidemia   . Hypertension    Past Surgical History:  Procedure Laterality Date  . LIVER BIOPSY     Social History   Socioeconomic History  . Marital status: Married    Spouse name: Not on file  . Number of children: Not on file  . Years of education: Not on file  . Highest education level: Not on file  Occupational History  . Not on file  Social Needs  . Financial resource strain: Not on file  . Food insecurity    Worry: Not on file    Inability: Not on file  . Transportation needs    Medical: Not on file    Non-medical: Not on file  Tobacco Use  . Smoking status: Never Smoker  . Smokeless tobacco: Never Used  Substance and Sexual Activity  . Alcohol use: No  . Drug use: No  . Sexual activity: Yes  Lifestyle  . Physical activity    Days per week: Not on file    Minutes per session: Not on file  . Stress: Not on file  Relationships  . Social Herbalist on phone: Not on file    Gets together: Not on file    Attends religious service: Not on file    Active member of club or organization: Not on file    Attends meetings of clubs or organizations: Not on file    Relationship status: Not on file  Other Topics Concern  . Not on file  Social History  Narrative  . Not on file   Allergies  Allergen Reactions  . Codeine Nausea Only  . Other     Champagne causes vomiting, diarrhea, and nausea.   No family history on file.no family history   Current Outpatient Medications (Endocrine & Metabolic):  .  predniSONE (DELTASONE) 50 MG tablet, Take 1 tablet daily for 5 days. Then take 1/2 tablet daily for 2 days.  Current Outpatient Medications (Cardiovascular):  .  amLODipine (NORVASC) 10 MG tablet, Take 1 tablet (10 mg total) by mouth daily. [name] Kitchen  atorvastatin (LIPITOR) 20 MG tablet, Take 1 tablet (20 mg total) by mouth daily at 6 PM.  Current Outpatient Medications (Respiratory):  .  fluticasone (FLONASE) 50 MCG/ACT nasal spray, instill 2 sprays into each nostril once daily  Current Outpatient Medications (Analgesics):  .  diclofenac (VOLTAREN) 75 MG EC tablet, TAKE 1 TABLET BY MOUTH TWICE A DAY   Current Outpatient Medications (Other):  [name] Kitchen  Cholecalciferol (VITAMIN D3) 2000 units TABS, Take by mouth. .  cyclobenzaprine (FLEXERIL) 10 MG tablet, Take 1 tablet (10 mg total) by mouth 3 (three) times daily as needed for muscle spasms. [name] Kitchen  dexlansoprazole (DEXILANT) 60 MG capsule,  Take by mouth. .  Diclofenac Sodium 2 % SOLN, Place 2 g onto the skin 2 (two) times daily. [name] Kitchen  gabapentin (NEURONTIN) 100 MG capsule, TAKE 2 CAPSULES (200 MG TOTAL) BY MOUTH AT BEDTIME. .  mesalamine (LIALDA) 1.2 g EC tablet, Take by mouth. .  triamcinolone cream (KENALOG) 0.5 %, Apply topically 2 (two) times daily. .  Vitamin D, Ergocalciferol, (DRISDOL) 1.25 MG (50000 UT) CAPS capsule, Take 1 capsule (50,000 Units total) by mouth every 7 (seven) days. .  Vitamin D, Ergocalciferol, (DRISDOL) 50000 units CAPS capsule, Take 1 capsule (50,000 Units total) by mouth every 7 (seven) days.    Past medical history, social, surgical and family history all reviewed in electronic medical record.  No pertanent information unless stated regarding to the chief complaint.    Review of Systems:  No headache, visual changes, nausea, vomiting, diarrhea, constipation, dizziness, abdominal pain, skin rash, fevers, chills, night sweats, weight loss, swollen lymph nodes, body aches, joint swelling, muscle aches, chest pain, shortness of breath, mood changes.   Objective  Blood pressure 130/70, pulse 75, height 6' 4"  (1.93 m), weight 249 lb (112.9 kg), SpO2 98 %.    General: No apparent distress alert and oriented x3 mood and affect normal, dressed appropriately.  HEENT: Pupils equal, extraocular movements intact  Respiratory: Patient's speak in full sentences and does not appear short of breath  Cardiovascular: No lower extremity edema, non tender, no erythema  Skin: Warm dry intact with no signs of infection or rash on extremities or on axial skeleton.  Abdomen: Soft nontender  Neuro: Cranial nerves II through XII are intact, neurovascularly intact in all extremities with 2+ DTRs and 2+ pulses.  Lymph: No lymphadenopathy of posterior or anterior cervical chain or axillae bilaterally.  Gait normal with good balance and coordination.  MSK:  Non tender with full range of motion and good stability and symmetric strength and tone of shoulders, elbows, wrist, hip, knee and ankles bilaterally.  Low back exam does have some loss of lordosis, some tightness noted of the paraspinal musculature.  Negative straight leg test.  Mild positive pain with Corky Sox test.  Osteopathic findings C6 flexed rotated and side bent left T3 extended rotated and side bent right inhaled third rib T7 extended rotated and side bent left L2 flexed rotated and side bent right Sacrum left on left     Impression and Recommendations:     This case required medical decision making of moderate complexity. The above documentation has been reviewed and is accurate and complete [name], DO       Note: This dictation was prepared with Dragon dictation along with smaller phrase technology. Any  transcriptional errors that result from this process are unintentional.

## 2018-10-27 ENCOUNTER — Encounter: Payer: Self-pay | Admitting: Family Medicine

## 2018-10-27 ENCOUNTER — Ambulatory Visit: Payer: Managed Care, Other (non HMO) | Admitting: Family Medicine

## 2018-10-27 ENCOUNTER — Other Ambulatory Visit: Payer: Self-pay

## 2018-10-27 VITALS — BP 110/72 | HR 101 | Ht 76.0 in | Wt 243.0 lb

## 2018-10-27 DIAGNOSIS — G8929 Other chronic pain: Secondary | ICD-10-CM | POA: Diagnosis not present

## 2018-10-27 DIAGNOSIS — M999 Biomechanical lesion, unspecified: Secondary | ICD-10-CM | POA: Diagnosis not present

## 2018-10-27 DIAGNOSIS — M545 Low back pain: Secondary | ICD-10-CM | POA: Diagnosis not present

## 2018-10-27 NOTE — Assessment & Plan Note (Signed)
Multifactorial.  Discussed posture and numbness, discussed which activities of doing which wants to avoid.  Patient is to increase activity slowly.  Increase activity as tolerated.

## 2018-10-27 NOTE — Patient Instructions (Signed)
Making progress Keep up what you are doing See me again in 5-6 weeks

## 2018-10-27 NOTE — Assessment & Plan Note (Signed)
Decision today to treat with OMT was based on Physical Exam  After verbal consent patient was treated with HVLA, ME, FPR techniques in  thoracic, lumbar and sacral areas  Patient tolerated the procedure well with improvement in symptoms  Patient given exercises, stretches and lifestyle modifications  See medications in patient instructions if given  Patient will follow up in 4 weeks

## 2018-10-27 NOTE — Progress Notes (Signed)
[name] [address], [place] [address] Phone: [phone] Subjective:   [name], am serving as a scribe for Dr. Hulan Saas.   CC: Back pain and hip pain follow-up  [id number]     Update 10/27/2018 Johnny [name] is a 38 y.o. male coming in with complaint of back pain. Patient is here for OMT today. Patient states has had good and bad days. Continues to use gabapentin. Intermittent radiating into the pelvis but most of pain is still in lumbar spine.  Patient has been doing relatively well but continues to have some discomfort and pain.  States that it seems to come and go.  No rhyme or reason.      Past Medical History:  Diagnosis Date  . Crohn's disease ([place])   . GERD (gastroesophageal reflux disease)   . Hyperlipidemia   . Hypertension    Past Surgical History:  Procedure Laterality Date  . LIVER BIOPSY     Social History   Socioeconomic History  . Marital status: Married    Spouse name: Not on file  . Number of children: Not on file  . Years of education: Not on file  . Highest education level: Not on file  Occupational History  . Not on file  Social Needs  . Financial resource strain: Not on file  . Food insecurity    Worry: Not on file    Inability: Not on file  . Transportation needs    Medical: Not on file    Non-medical: Not on file  Tobacco Use  . Smoking status: Never Smoker  . Smokeless tobacco: Never Used  Substance and Sexual Activity  . Alcohol use: No  . Drug use: No  . Sexual activity: Yes  Lifestyle  . Physical activity    Days per week: Not on file    Minutes per session: Not on file  . Stress: Not on file  Relationships  . Social Herbalist on phone: Not on file    Gets together: Not on file    Attends religious service: Not on file    Active member of club or organization: Not on file    Attends meetings of clubs or organizations: Not on file    Relationship status:  Not on file  Other Topics Concern  . Not on file  Social History Narrative  . Not on file   Allergies  Allergen Reactions  . Codeine Nausea Only  . Other     Champagne causes vomiting, diarrhea, and nausea.   No family history on file.  Current Outpatient Medications (Endocrine & Metabolic):  .  predniSONE (DELTASONE) 50 MG tablet, Take 1 tablet daily for 5 days. Then take 1/2 tablet daily for 2 days.  Current Outpatient Medications (Cardiovascular):  .  amLODipine (NORVASC) 10 MG tablet, Take 1 tablet (10 mg total) by mouth daily. [name] Kitchen  atorvastatin (LIPITOR) 20 MG tablet, Take 1 tablet (20 mg total) by mouth daily at 6 PM.  Current Outpatient Medications (Respiratory):  .  fluticasone (FLONASE) 50 MCG/ACT nasal spray, instill 2 sprays into each nostril once daily  Current Outpatient Medications (Analgesics):  .  diclofenac (VOLTAREN) 75 MG EC tablet, TAKE 1 TABLET BY MOUTH TWICE A DAY   Current Outpatient Medications (Other):  [name] Kitchen  Cholecalciferol (VITAMIN D3) 2000 units TABS, Take by mouth. .  cyclobenzaprine (FLEXERIL) 10 MG tablet, Take 1 tablet (10 mg total) by mouth 3 (three)  times daily as needed for muscle spasms. [name] Kitchen  dexlansoprazole (DEXILANT) 60 MG capsule, Take by mouth. .  Diclofenac Sodium 2 % SOLN, Place 2 g onto the skin 2 (two) times daily. [name] Kitchen  gabapentin (NEURONTIN) 100 MG capsule, TAKE 2 CAPSULES (200 MG TOTAL) BY MOUTH AT BEDTIME. .  mesalamine (LIALDA) 1.2 g EC tablet, Take by mouth. .  triamcinolone cream (KENALOG) 0.5 %, Apply topically 2 (two) times daily. .  Vitamin D, Ergocalciferol, (DRISDOL) 1.25 MG (50000 UT) CAPS capsule, Take 1 capsule (50,000 Units total) by mouth every 7 (seven) days. .  Vitamin D, Ergocalciferol, (DRISDOL) 50000 units CAPS capsule, Take 1 capsule (50,000 Units total) by mouth every 7 (seven) days.    Past medical history, social, surgical and family history all reviewed in electronic medical record.  No pertanent information unless  stated regarding to the chief complaint.   Review of Systems:  No headache, visual changes, nausea, vomiting, diarrhea, constipation, dizziness, abdominal pain, skin rash, fevers, chills, night sweats, weight loss, swollen lymph nodes, body aches, joint swelling,  chest pain, shortness of breath, mood changes.  Positive muscle aches  Objective  Blood pressure 110/72, pulse (!) 101, height 6' 4"  (1.93 m), weight 243 lb (110.2 kg), SpO2 98 %.    General: No apparent distress alert and oriented x3 mood and affect normal, dressed appropriately.  HEENT: Pupils equal, extraocular movements intact  Respiratory: Patient's speak in full sentences and does not appear short of breath  Cardiovascular: No lower extremity edema, non tender, no erythema  Skin: Warm dry intact with no signs of infection or rash on extremities or on axial skeleton.  Abdomen: Soft nontender  Neuro: Cranial nerves II through XII are intact, neurovascularly intact in all extremities with 2+ DTRs and 2+ pulses.  Lymph: No lymphadenopathy of posterior or anterior cervical chain or axillae bilaterally.  Gait normal with good balance and coordination.  MSK:  Non tender with full range of motion and good stability and symmetric strength and tone of shoulders, elbows, wrist, hip, knee and ankles bilaterally.  Low back examination with some mild improvement in core strength.  Continues significant discomfort though continue to palpation diffusely in the paraspinal musculature of the lumbar spine.  Tightness with Corky Sox test negative straight leg test.  Osteopathic findings  T6 extended rotated and side bent left L2 flexed rotated and side bent right Sacrum right on right    Impression and Recommendations:     This case required medical decision making of moderate complexity. The above documentation has been reviewed and is accurate and complete [name], DO       Note: This dictation was prepared with Dragon  dictation along with smaller phrase technology. Any transcriptional errors that result from this process are unintentional.

## 2018-10-31 ENCOUNTER — Encounter: Payer: Self-pay | Admitting: Family Medicine

## 2018-10-31 MED ORDER — MELOXICAM 15 MG PO TABS: 15.0000 mg | ORAL_TABLET | Freq: Every day | ORAL | 0 refills | Status: DC

## 2018-11-18 ENCOUNTER — Other Ambulatory Visit: Payer: Self-pay | Admitting: Family Medicine

## 2018-11-24 ENCOUNTER — Encounter: Payer: Self-pay | Admitting: Family Medicine

## 2018-11-25 ENCOUNTER — Other Ambulatory Visit: Payer: Self-pay | Admitting: Family Medicine

## 2018-11-29 ENCOUNTER — Encounter: Payer: Self-pay | Admitting: Physician Assistant

## 2018-11-29 ENCOUNTER — Other Ambulatory Visit: Payer: Self-pay

## 2018-11-29 ENCOUNTER — Ambulatory Visit: Payer: Self-pay | Admitting: *Deleted

## 2018-11-29 ENCOUNTER — Ambulatory Visit (INDEPENDENT_AMBULATORY_CARE_PROVIDER_SITE_OTHER): Payer: Managed Care, Other (non HMO) | Admitting: Physician Assistant

## 2018-11-29 VITALS — HR 80 | Temp 97.3°F | Ht 76.0 in | Wt 245.0 lb

## 2018-11-29 DIAGNOSIS — Z20822 Contact with and (suspected) exposure to covid-19: Secondary | ICD-10-CM

## 2018-11-29 DIAGNOSIS — I10 Essential (primary) hypertension: Secondary | ICD-10-CM | POA: Diagnosis not present

## 2018-11-29 DIAGNOSIS — R05 Cough: Secondary | ICD-10-CM | POA: Diagnosis not present

## 2018-11-29 DIAGNOSIS — R059 Cough, unspecified: Secondary | ICD-10-CM

## 2018-11-29 MED ORDER — AMLODIPINE BESYLATE 5 MG PO TABS: 5.0000 mg | ORAL_TABLET | Freq: Every day | ORAL | 0 refills | Status: DC

## 2018-11-29 NOTE — Telephone Encounter (Signed)
See note

## 2018-11-29 NOTE — Telephone Encounter (Signed)
   Reason for Disposition . [1] MILD dizziness (e.g., walking normally) AND [2] has NOT been evaluated by physician for this  (Exception: dizziness caused by heat exposure, sudden standing, or poor fluid intake)  Answer Assessment - Initial Assessment Questions 1. DESCRIPTION: "Describe your dizziness."     Lightheaded  2. LIGHTHEADED: "Do you feel lightheaded?" (e.g., somewhat faint, woozy, weak upon standing)     Not woozy or faint  3. VERTIGO: "Do you feel like either you or the room is spinning or tilting?" (i.e. vertigo)     Room spinning  4. SEVERITY: "How bad is it?"  "Do you feel like you are going to faint?" "Can you stand and walk?"   - MILD - walking normally   - MODERATE - interferes with normal activities (e.g., work, school)    - SEVERE - unable to stand, requires support to walk, feels like passing out now      Mild  5. ONSET:  "When did the dizziness begin?"     Last night 6. AGGRAVATING FACTORS: "Does anything make it worse?" (e.g., standing, change in head position)    No aggravating factors  7. HEART RATE: "Can you tell me your heart rate?" "How many beats in 15 seconds?"  (Note: not all patients can do this)       81 bpm using pulse oximeter  8. CAUSE: "What do you think is causing the dizziness?"     Unsure  9. RECURRENT SYMPTOM: "Have you had dizziness before?" If so, ask: "When was the last time?" "What happened that time?"    No 10. OTHER SYMPTOMS: "Do you have any other symptoms?" (e.g., fever, chest pain, vomiting, diarrhea, bleeding)       Sore throat, burning in lower esophagus  11. PREGNANCY: "Is there any chance you are pregnant?" "When was your last menstrual period?"       N/A  Protocols used: DIZZINESS [facility]  Patient states he began having mild dizziness last night, and it is still present this morning.  He also notes a mild sore throat and burning in esophagus. He denies chest pain, shortness of breath, vomiting, diarrhea, fever.   Checked his pulse with a home pulse oximeter while on the phone - pulse 81 bpm, O2 sat 98%.  Patient described dizziness as feeling lightheaded, nothing aggravates the dizziness.  He is able to stand and walk without assistance.  Advised patient he should have an appointment with PCP office for evaluation of this dizziness and sorethraot.  Transferred call to [facility] for him to be scheduled for appointment.  He is scheduled today for a virtual appointment today at 10:40 am.

## 2018-11-29 NOTE — Progress Notes (Signed)
Virtual Visit via Video   I connected with Johnny [name] on 11/29/18 at 10:40 AM EDT by a video enabled telemedicine application and verified that I am speaking with the correct person using two identifiers. Location patient: Home Location provider: [facility], Office Persons participating in the virtual visit: JODI KAPPES, Inda Coke PA-C, Johnny Pickler, LPN   I discussed the limitations of evaluation and management by telemedicine and the availability of in person appointments. The patient expressed understanding and agreed to proceed.  I acted as a Education administrator for [employer], PA-C [employer], LPN  Subjective:   HPI:   Sore throat Pt c/o sore throat this morning, slight cough last night, nasal congestion and chest congestion, lightheaded this morning. Denies headache or blurred vision. Denies fever or chills. He took Tylenol 1000 mg this morning, tried Flonase yesterday.   He reports that he has hypertension, but has been without his Norvasc 10 mg for at least 1 month.  He denies any concerns for dehydration, does not have any diarrhea.  He is eating and drinking well.  Denies any known COVID contacts.  Blood pressure check today at home was 145/85 He stated that he gave blood last week and his blood pressure was 118/70-ish  Denies: shortness of breath, chest pain, sensation of room spinning, lower leg swelling, abdominal pain   ROS: See pertinent positives and negatives per HPI.  Patient Active Problem List   Diagnosis Date Noted   Nonallopathic lesion of sacral region 08/24/2018   Nonallopathic lesion of lumbosacral region 08/24/2018   Nonallopathic lesion of thoracic region 08/24/2018   Low back pain 07/27/2018   Vitamin D deficiency 11/29/2017   Prediabetes 12/24/2016   Tinnitus 12/24/2016   Crohn's disease ([place]) 06/11/2016   Eczema 06/11/2016   Fatty liver 06/11/2016   GERD (gastroesophageal reflux disease) 06/11/2016   IBS (irritable  bowel syndrome) 06/11/2016   Vitamin B12 deficiency 06/11/2016   Dyslipidemia 05/25/2014   Benign essential hypertension 01/77/9390   Metabolic syndrome 30/10/2328    Social History   Tobacco Use   Smoking status: Never Smoker   Smokeless tobacco: Never Used  Substance Use Topics   Alcohol use: No    Current Outpatient Medications:    atorvastatin (LIPITOR) 20 MG tablet, Take 1 tablet (20 mg total) by mouth daily at 6 PM., Disp: 90 tablet, Rfl: 3   cyclobenzaprine (FLEXERIL) 10 MG tablet, Take 1 tablet (10 mg total) by mouth 3 (three) times daily as needed for muscle spasms., Disp: 30 tablet, Rfl: 0   dexlansoprazole (DEXILANT) 60 MG capsule, Take by mouth., Disp: , Rfl:    diclofenac (VOLTAREN) 75 MG EC tablet, TAKE 1 TABLET BY MOUTH TWICE A DAY, Disp: 60 tablet, Rfl: 0   Diclofenac Sodium 2 % SOLN, Place 2 g onto the skin 2 (two) times daily., Disp: 112 g, Rfl: 3   fluticasone (FLONASE) 50 MCG/ACT nasal spray, instill 2 sprays into each nostril once daily, Disp: , Rfl:    gabapentin (NEURONTIN) 100 MG capsule, TAKE 2 CAPSULES (200 MG TOTAL) BY MOUTH AT BEDTIME., Disp: 180 capsule, Rfl: 2   meloxicam (MOBIC) 15 MG tablet, TAKE 1 TABLET BY MOUTH EVERY DAY, Disp: 30 tablet, Rfl: 0   mesalamine (LIALDA) 1.2 g EC tablet, Take by mouth., Disp: , Rfl:    triamcinolone cream (KENALOG) 0.5 %, Apply topically 2 (two) times daily., Disp: 30 g, Rfl: 1   Vitamin D, Ergocalciferol, (DRISDOL) 1.25 MG (50000 UT) CAPS capsule,  TAKE 1 CAPSULE (50,000 UNITS TOTAL) BY MOUTH EVERY 7 (SEVEN) DAYS., Disp: 12 capsule, Rfl: 0   amLODipine (NORVASC) 5 MG tablet, Take 1 tablet (5 mg total) by mouth daily., Disp: 30 tablet, Rfl: 0   Cholecalciferol (VITAMIN D3) 2000 units TABS, Take by mouth., Disp: , Rfl:   Allergies  Allergen Reactions   Codeine Nausea Only   Other     Champagne causes vomiting, diarrhea, and nausea.    Objective:   VITALS: Per patient if applicable, see  vitals. GENERAL: Alert, appears well and in no acute distress. HEENT: Atraumatic, conjunctiva clear, no obvious abnormalities on inspection of external nose and ears. NECK: Normal movements of the head and neck. CARDIOPULMONARY: No increased WOB. Speaking in clear sentences. I:E ratio WNL.  MS: Moves all visible extremities without noticeable abnormality. PSYCH: Pleasant and cooperative, well-groomed. Speech normal rate and rhythm. Affect is appropriate. Insight and judgement are appropriate. Attention is focused, linear, and appropriate.  NEURO: CN grossly intact. Oriented as arrived to appointment on time with no prompting. Moves both UE equally.  SKIN: No obvious lesions, wounds, erythema, or cyanosis noted on face or hands.  Assessment and Plan:   [name] was seen today for sore throat and dizziness.  Diagnoses and all orders for this visit:  Benign essential hypertension Uncontrolled.  He is hesitant to resume his Norvasc 10 mg, so we will restart 5 mg daily.  I recommend that he follow-up with his PCP in 2 to 4 weeks, sooner if symptoms persist or change.  For his lightheadedness he is currently without any evidence of acute distress. I recommended that he push fluids and if his symptoms worsen do not improve or if they develop into more cardiac or pulmonary symptoms to seek more urgent or emergent care.  Cough Patient has a respiratory illness without signs of acute distress or respiratory compromise at this time. This is likely a viral infection, which can come from a number of respiratory viruses. We are going to send patient for drive-up testing. As a precaution, they have been advised to remain home until COVID-19 results and then possible further quarantine after that based on results and symptoms. Advised if they experience a "second sickening" or worsening symptoms as the illness progresses, they are to call the office for further instructions or seek emergent evaluation for any  severe symptoms.  -     Novel Coronavirus, NAA ([facility])  With his sore throat, I told him I cannot rule out strep throat however I had low suspicion of this and  let us know if his symptoms persist and we could consider antibiotics.  Other orders -     amLODipine (NORVASC) 5 MG tablet; Take 1 tablet (5 mg total) by mouth daily.   Reviewed expectations re: course of current medical issues.  Discussed self-management of symptoms.  Outlined signs and symptoms indicating need for more acute intervention.  Patient verbalized understanding and all questions were answered.  Health Maintenance issues including appropriate healthy diet, exercise, and smoking avoidance were discussed with patient.  See orders for this visit as documented in the electronic medical record.  I discussed the assessment and treatment plan with the patient. The patient was provided an opportunity to ask questions and all were answered. The patient agreed with the plan and demonstrated an understanding of the instructions.   The patient was advised to call back or seek an in-person evaluation if the symptoms worsen or if the condition fails to improve as  anticipated.   CMA or LPN served as scribe during this visit. History, Physical, and Plan performed by medical provider. The above documentation has been reviewed and is accurate and complete.   [employer] [place], [place] 11/29/2018

## 2018-12-01 ENCOUNTER — Ambulatory Visit: Payer: Managed Care, Other (non HMO) | Admitting: Family Medicine

## 2018-12-01 LAB — NOVEL CORONAVIRUS, NAA: SARS-CoV-2, NAA: NOT DETECTED

## 2018-12-07 ENCOUNTER — Encounter: Payer: Self-pay | Admitting: Family Medicine

## 2018-12-07 ENCOUNTER — Other Ambulatory Visit: Payer: Self-pay

## 2018-12-07 ENCOUNTER — Ambulatory Visit: Payer: Managed Care, Other (non HMO) | Admitting: Family Medicine

## 2018-12-07 VITALS — BP 132/86 | HR 82 | Ht 76.0 in | Wt 245.0 lb

## 2018-12-07 DIAGNOSIS — M999 Biomechanical lesion, unspecified: Secondary | ICD-10-CM

## 2018-12-07 DIAGNOSIS — M545 Low back pain, unspecified: Secondary | ICD-10-CM

## 2018-12-07 DIAGNOSIS — G8929 Other chronic pain: Secondary | ICD-10-CM

## 2018-12-07 NOTE — Assessment & Plan Note (Signed)
Decision today to treat with OMT was based on Physical Exam  After verbal consent patient was treated with HVLA, ME, FPR techniques in  thoracic, lumbar and sacral areas  Patient tolerated the procedure well with improvement in symptoms  Patient given exercises, stretches and lifestyle modifications  See medications in patient instructions if given  Patient will follow up in  6-8 weeks

## 2018-12-07 NOTE — Assessment & Plan Note (Signed)
Stable overall.  Continues to have some discomfort and pain, and encouraged continued vitamin D, continue the core strengthening, follow-up with me again in 4 to 8 weeks

## 2018-12-07 NOTE — Progress Notes (Signed)
[name] [address] [place], [place] [address] Phone: [phone] Subjective:   I, Johnny [name], am serving as a scribe for Dr. Hulan Saas.   CC: Low back pain follow-up  [id number]  Johnny [name] is a 38 y.o. male coming in with complaint of back pain. Last seen on 10/27/2018 for OMT Patient states he feels about the same. States his pain is not getting worse.  Patient states that overall able to do daily activities and nothing that stops him from regular daily activities or even things such as playing with his kids    Past Medical History:  Diagnosis Date  . Crohn's disease ([place])   . GERD (gastroesophageal reflux disease)   . Hyperlipidemia   . Hypertension    Past Surgical History:  Procedure Laterality Date  . LIVER BIOPSY     Social History   Socioeconomic History  . Marital status: Married    Spouse name: Not on file  . Number of children: Not on file  . Years of education: Not on file  . Highest education level: Not on file  Occupational History  . Not on file  Social Needs  . Financial resource strain: Not on file  . Food insecurity    Worry: Not on file    Inability: Not on file  . Transportation needs    Medical: Not on file    Non-medical: Not on file  Tobacco Use  . Smoking status: Never Smoker  . Smokeless tobacco: Never Used  Substance and Sexual Activity  . Alcohol use: No  . Drug use: No  . Sexual activity: Yes  Lifestyle  . Physical activity    Days per week: Not on file    Minutes per session: Not on file  . Stress: Not on file  Relationships  . Social Herbalist on phone: Not on file    Gets together: Not on file    Attends religious service: Not on file    Active member of club or organization: Not on file    Attends meetings of clubs or organizations: Not on file    Relationship status: Not on file  Other Topics Concern  . Not on file  Social History Narrative  . Not on file   Allergies  Allergen Reactions  . Codeine Nausea Only  . Other     Champagne causes vomiting, diarrhea, and nausea.   History reviewed. No pertinent family history.   Current Outpatient Medications (Cardiovascular):  .  amLODipine (NORVASC) 5 MG tablet, Take 1 tablet (5 mg total) by mouth daily. Johnny [name]  atorvastatin (LIPITOR) 20 MG tablet, Take 1 tablet (20 mg total) by mouth daily at 6 PM.  Current Outpatient Medications (Respiratory):  .  fluticasone (FLONASE) 50 MCG/ACT nasal spray, instill 2 sprays into each nostril once daily  Current Outpatient Medications (Analgesics):  .  diclofenac (VOLTAREN) 75 MG EC tablet, TAKE 1 TABLET BY MOUTH TWICE A DAY .  meloxicam (MOBIC) 15 MG tablet, TAKE 1 TABLET BY MOUTH EVERY DAY   Current Outpatient Medications (Other):  Johnny [name]  Cholecalciferol (VITAMIN D3) 2000 units TABS, Take by mouth. .  cyclobenzaprine (FLEXERIL) 10 MG tablet, Take 1 tablet (10 mg total) by mouth 3 (three) times daily as needed for muscle spasms. Johnny [name]  dexlansoprazole (DEXILANT) 60 MG capsule, Take by mouth. .  Diclofenac Sodium 2 % SOLN, Place 2 g onto the skin 2 (two) times daily. Johnny [name]  gabapentin (  NEURONTIN) 100 MG capsule, TAKE 2 CAPSULES (200 MG TOTAL) BY MOUTH AT BEDTIME. .  mesalamine (LIALDA) 1.2 g EC tablet, Take by mouth. .  triamcinolone cream (KENALOG) 0.5 %, Apply topically 2 (two) times daily. .  Vitamin D, Ergocalciferol, (DRISDOL) 1.25 MG (50000 UT) CAPS capsule, TAKE 1 CAPSULE (50,000 UNITS TOTAL) BY MOUTH EVERY 7 (SEVEN) DAYS.    Past medical history, social, surgical and family history all reviewed in electronic medical record.  No pertanent information unless stated regarding to the chief complaint.   Review of Systems:  No headache, visual changes, nausea, vomiting, diarrhea, constipation, dizziness, abdominal pain, skin rash, fevers, chills, night sweats, weight loss, swollen lymph nodes, body aches, joint swelling,chest pain, shortness of breath, mood changes.   Positive muscle aches  Objective  Blood pressure 132/86, pulse 82, height 6' 4"  (1.93 m), weight 245 lb (111.1 kg), SpO2 96 %.    General: No apparent distress alert and oriented x3 mood and affect normal, dressed appropriately.  HEENT: Pupils equal, extraocular movements intact  Respiratory: Patient's speak in full sentences and does not appear short of breath  Cardiovascular: No lower extremity edema, non tender, no erythema  Skin: Warm dry intact with no signs of infection or rash on extremities or on axial skeleton.  Abdomen: Soft nontender  Neuro: Cranial nerves II through XII are intact, neurovascularly intact in all extremities with 2+ DTRs and 2+ pulses.  Lymph: No lymphadenopathy of posterior or anterior cervical chain or axillae bilaterally.  Gait normal with good balance and coordination.  MSK:  Non tender with full range of motion and good stability and symmetric strength and tone of shoulders, elbows, wrist, hip, knee and ankles bilaterally.  Back Exam:  Inspection: Unremarkable  Motion: Flexion 45 deg, Extension 25 deg, Side Bending to 25 deg bilaterally,  Rotation to 45 deg bilaterally  SLR laying: Negative  XSLR laying: Negative  Palpable tenderness: Loss of lordosis with tightness noted in the paraspinal musculature lumbar spine right greater than left. FABER: Tightness bilaterally. Sensory change: Gross sensation intact to all lumbar and sacral dermatomes.  Reflexes: 2+ at both patellar tendons, 2+ at achilles tendons, Babinski's downgoing.  Strength at foot  Plantar-flexion: 5/5 Dorsi-flexion: 5/5 Eversion: 5/5 Inversion: 5/5  Leg strength  Quad: 5/5 Hamstring: 5/5 Hip flexor: 5/5 Hip abductors: 5/5    Osteopathic findings  T6 extended rotated and side bent left L2 flexed rotated and side bent right Sacrum right on right    Impression and Recommendations:     This case required medical decision making of moderate complexity. The above documentation has  been reviewed and is accurate and complete [name], DO       Note: This dictation was prepared with Dragon dictation along with smaller phrase technology. Any transcriptional errors that result from this process are unintentional.

## 2018-12-07 NOTE — Patient Instructions (Signed)
Good to see you.  Ice 20 minutes 2 times daily. Usually after activity and before bed. Exercises 3 times a week.  pennsaid pinkie amount topically 2 times daily as needed.  See me again in 6-8 weeks

## 2018-12-21 ENCOUNTER — Other Ambulatory Visit: Payer: Self-pay | Admitting: Physician Assistant

## 2018-12-28 ENCOUNTER — Other Ambulatory Visit: Payer: Self-pay | Admitting: Family Medicine

## 2019-01-13 MED ORDER — ALUM & MAG HYDROXIDE-SIMETH 200-200-20 MG/5ML PO SUSP
30.00 | ORAL | Status: DC
Start: ? — End: 2019-01-13

## 2019-01-13 MED ORDER — SODIUM CHLORIDE 0.9 % IV SOLN
10.00 | INTRAVENOUS | Status: DC
Start: ? — End: 2019-01-13

## 2019-01-13 MED ORDER — ATORVASTATIN CALCIUM 40 MG PO TABS
40.00 | ORAL_TABLET | ORAL | Status: DC
Start: 2019-01-12 — End: 2019-01-13

## 2019-01-13 MED ORDER — MUPIROCIN 2 % EX OINT
TOPICAL_OINTMENT | CUTANEOUS | Status: DC
Start: 2019-01-12 — End: 2019-01-13

## 2019-01-13 MED ORDER — GENERIC EXTERNAL MEDICATION
3.38 | Status: DC
Start: 2019-01-12 — End: 2019-01-13

## 2019-01-13 MED ORDER — ACETAMINOPHEN 325 MG PO TABS
650.00 | ORAL_TABLET | ORAL | Status: DC
Start: ? — End: 2019-01-13

## 2019-01-13 MED ORDER — NITROGLYCERIN 0.4 MG SL SUBL
0.40 | SUBLINGUAL_TABLET | SUBLINGUAL | Status: DC
Start: ? — End: 2019-01-13

## 2019-01-13 MED ORDER — GENERIC EXTERNAL MEDICATION
Status: DC
Start: ? — End: 2019-01-13

## 2019-01-13 MED ORDER — PREDNISONE 20 MG PO TABS
20.00 | ORAL_TABLET | ORAL | Status: DC
Start: 2019-01-13 — End: 2019-01-13

## 2019-01-13 MED ORDER — ONDANSETRON HCL 4 MG/2ML IJ SOLN
4.00 | INTRAMUSCULAR | Status: DC
Start: ? — End: 2019-01-13

## 2019-01-13 MED ORDER — PANTOPRAZOLE SODIUM 40 MG PO TBEC
40.00 | DELAYED_RELEASE_TABLET | ORAL | Status: DC
Start: 2019-01-12 — End: 2019-01-13

## 2019-01-13 MED ORDER — GABAPENTIN 100 MG PO CAPS
200.00 | ORAL_CAPSULE | ORAL | Status: DC
Start: 2019-01-12 — End: 2019-01-13

## 2019-01-13 MED ORDER — AMLODIPINE BESYLATE 5 MG PO TABS
5.00 | ORAL_TABLET | ORAL | Status: DC
Start: 2019-01-13 — End: 2019-01-13

## 2019-01-13 MED ORDER — MESALAMINE 400 MG PO CPDR
800.00 | DELAYED_RELEASE_CAPSULE | ORAL | Status: DC
Start: 2019-01-12 — End: 2019-01-13

## 2019-01-15 ENCOUNTER — Encounter: Payer: Self-pay | Admitting: Family Medicine

## 2019-01-25 ENCOUNTER — Other Ambulatory Visit: Payer: Self-pay | Admitting: Physician Assistant

## 2019-02-01 ENCOUNTER — Other Ambulatory Visit: Payer: Self-pay

## 2019-02-01 ENCOUNTER — Ambulatory Visit: Payer: Managed Care, Other (non HMO) | Admitting: Family Medicine

## 2019-02-01 ENCOUNTER — Encounter: Payer: Self-pay | Admitting: Family Medicine

## 2019-02-01 VITALS — BP 106/70 | HR 83 | Ht 76.0 in | Wt 240.0 lb

## 2019-02-01 DIAGNOSIS — M999 Biomechanical lesion, unspecified: Secondary | ICD-10-CM | POA: Diagnosis not present

## 2019-02-01 DIAGNOSIS — M545 Low back pain, unspecified: Secondary | ICD-10-CM

## 2019-02-01 DIAGNOSIS — G8929 Other chronic pain: Secondary | ICD-10-CM

## 2019-02-01 NOTE — Assessment & Plan Note (Signed)
Low back pain, discussed which activities to doing which wants to avoid.  Discussed icing regimen and home exercises, patient will increase activity slowly.  Avoid anti-inflammatories from now on.  Follow-up again in 4 to 8 weeks

## 2019-02-01 NOTE — Progress Notes (Signed)
[name] [place], [place] [address] Phone: [phone] Subjective:   I, Johnny [name], am serving as a scribe for Dr. Hulan Saas.  This visit occurred during the SARS-CoV-2 public health emergency.  Safety protocols were in place, including screening questions prior to the visit, additional usage of staff PPE, and extensive cleaning of exam room while observing appropriate contact time as indicated for disinfecting solutions.    CC: Back pain follow-up  [id number]  Johnny [name] is a 38 y.o. male coming in with complaint of back pain. Patient is here for OMT. Patient states that he feels slightly better.  Patient unfortunately secondary to the meloxicam potentially had a ulcer that did turn into sepsis and patient was in the hospital for 1 week.  Seem to have more of a Crohn's flare had not been as active.  Has been feeling somewhat better but now starting to have increasing discomfort and pain again.     Past Medical History:  Diagnosis Date  . Crohn's disease ([place])   . GERD (gastroesophageal reflux disease)   . Hyperlipidemia   . Hypertension    Past Surgical History:  Procedure Laterality Date  . LIVER BIOPSY     Social History   Socioeconomic History  . Marital status: Married    Spouse name: Not on file  . Number of children: Not on file  . Years of education: Not on file  . Highest education level: Not on file  Occupational History  . Not on file  Tobacco Use  . Smoking status: Never Smoker  . Smokeless tobacco: Never Used  Substance and Sexual Activity  . Alcohol use: No  . Drug use: No  . Sexual activity: Yes  Other Topics Concern  . Not on file  Social History Narrative  . Not on file   Social Determinants of Health   Financial Resource Strain:   . Difficulty of Paying Living Expenses: Not on file  Food Insecurity:   . Worried About Charity fundraiser in the Last Year: Not on file  . Ran Out of  Food in the Last Year: Not on file  Transportation Needs:   . Lack of Transportation (Medical): Not on file  . Lack of Transportation (Non-Medical): Not on file  Physical Activity:   . Days of Exercise per Week: Not on file  . Minutes of Exercise per Session: Not on file  Stress:   . Feeling of Stress : Not on file  Social Connections:   . Frequency of Communication with Friends and Family: Not on file  . Frequency of Social Gatherings with Friends and Family: Not on file  . Attends Religious Services: Not on file  . Active Member of Clubs or Organizations: Not on file  . Attends Archivist Meetings: Not on file  . Marital Status: Not on file   Allergies  Allergen Reactions  . Codeine Nausea Only  . Other     Champagne causes vomiting, diarrhea, and nausea.   No family history on file.   Current Outpatient Medications (Cardiovascular):  .  amLODipine (NORVASC) 5 MG tablet, TAKE 1 TABLET BY MOUTH EVERY DAY .  atorvastatin (LIPITOR) 20 MG tablet, Take 1 tablet (20 mg total) by mouth daily at 6 PM.  Current Outpatient Medications (Respiratory):  .  fluticasone (FLONASE) 50 MCG/ACT nasal spray, instill 2 sprays into each nostril once daily  Current Outpatient Medications (Analgesics):  .  diclofenac (VOLTAREN)  75 MG EC tablet, TAKE 1 TABLET BY MOUTH TWICE A DAY .  meloxicam (MOBIC) 15 MG tablet, TAKE 1 TABLET BY MOUTH EVERY DAY   Current Outpatient Medications (Other):  [name] Kitchen  Cholecalciferol (VITAMIN D3) 2000 units TABS, Take by mouth. .  cyclobenzaprine (FLEXERIL) 10 MG tablet, Take 1 tablet (10 mg total) by mouth 3 (three) times daily as needed for muscle spasms. [name] Kitchen  dexlansoprazole (DEXILANT) 60 MG capsule, Take by mouth. .  Diclofenac Sodium 2 % SOLN, Place 2 g onto the skin 2 (two) times daily. [name] Kitchen  gabapentin (NEURONTIN) 100 MG capsule, TAKE 2 CAPSULES (200 MG TOTAL) BY MOUTH AT BEDTIME. .  mesalamine (LIALDA) 1.2 g EC tablet, Take by mouth. .  triamcinolone cream  (KENALOG) 0.5 %, Apply topically 2 (two) times daily. .  Vitamin D, Ergocalciferol, (DRISDOL) 1.25 MG (50000 UT) CAPS capsule, TAKE 1 CAPSULE (50,000 UNITS TOTAL) BY MOUTH EVERY 7 (SEVEN) DAYS.    Past medical history, social, surgical and family history all reviewed in electronic medical record.  No pertanent information unless stated regarding to the chief complaint.   Review of Systems:  No headache, visual changes, nausea, vomiting, diarrhea, constipation, dizziness, abdominal pain, skin rash, fevers, chills, night sweats, weight loss, swollen lymph nodes, body aches, joint swelling, muscle aches, chest pain, shortness of breath, mood changes.   Objective  There were no vitals taken for this visit. Systems examined below as of    General: No apparent distress alert and oriented x3 mood and affect normal, dressed appropriately.  HEENT: Pupils equal, extraocular movements intact  Respiratory: Patient's speak in full sentences and does not appear short of breath  Cardiovascular: No lower extremity edema, non tender, no erythema  Skin: Warm dry intact with no signs of infection or rash on extremities or on axial skeleton.  Abdomen: Soft nontender  Neuro: Cranial nerves II through XII are intact, neurovascularly intact in all extremities with 2+ DTRs and 2+ pulses.  Lymph: No lymphadenopathy of posterior or anterior cervical chain or axillae bilaterally.  Gait normal with good balance and coordination.  MSK:  Non tender with full range of motion and good stability and symmetric strength and tone of shoulders, elbows, wrist, hip, knee and ankles bilaterally.  Back exam does have some mild loss of lordosis, patient does have some tightness in the paraspinal musculature mostly in the thoracolumbar and lumbar sacral junction.  Osteopathic findings  T6 extended rotated and side bent left L1 flexed rotated and side bent right Sacrum right on right    Impression and Recommendations:      This case required medical decision making of moderate complexity. The above documentation has been reviewed and is accurate and complete [name]       Note: This dictation was prepared with Dragon dictation along with smaller phrase technology. Any transcriptional errors that result from this process are unintentional.

## 2019-02-01 NOTE — Assessment & Plan Note (Signed)
Decision today to treat with OMT was based on Physical Exam  After verbal consent patient was treated with HVLA, ME, FPR techniques in  thoracic, lumbar and sacral areas  Patient tolerated the procedure well with improvement in symptoms  Patient given exercises, stretches and lifestyle modifications  See medications in patient instructions if given  Patient will follow up in 4-8 weeks

## 2019-02-01 NOTE — Patient Instructions (Signed)
  [address] [place], [place] [address] Phone [phone]  Good to see you Happy Holidays! DHEA 50 mg Daily for 4 weeks Keep up the exercises  See me again in 4-5 weeks

## 2019-02-13 ENCOUNTER — Other Ambulatory Visit: Payer: Self-pay | Admitting: Family Medicine

## 2019-02-14 ENCOUNTER — Other Ambulatory Visit: Payer: Self-pay

## 2019-02-14 MED ORDER — AMLODIPINE BESYLATE 5 MG PO TABS: 5.0000 mg | ORAL_TABLET | Freq: Every day | ORAL | 0 refills | Status: DC

## 2019-03-01 ENCOUNTER — Other Ambulatory Visit: Payer: Self-pay

## 2019-03-01 ENCOUNTER — Ambulatory Visit (HOSPITAL_COMMUNITY)
Admission: EM | Admit: 2019-03-01 | Discharge: 2019-03-01 | Disposition: A | Discharge status: Home / Self Care | Payer: Managed Care, Other (non HMO) | Attending: Urgent Care | Admitting: Urgent Care

## 2019-03-01 ENCOUNTER — Encounter (HOSPITAL_COMMUNITY): Payer: Self-pay | Admitting: Emergency Medicine

## 2019-03-01 DIAGNOSIS — R059 Cough, unspecified: Secondary | ICD-10-CM

## 2019-03-01 DIAGNOSIS — R070 Pain in throat: Secondary | ICD-10-CM | POA: Insufficient documentation

## 2019-03-01 DIAGNOSIS — J069 Acute upper respiratory infection, unspecified: Secondary | ICD-10-CM | POA: Diagnosis not present

## 2019-03-01 DIAGNOSIS — R079 Chest pain, unspecified: Secondary | ICD-10-CM

## 2019-03-01 DIAGNOSIS — K5090 Crohn's disease, unspecified, without complications: Secondary | ICD-10-CM | POA: Diagnosis not present

## 2019-03-01 DIAGNOSIS — B349 Viral infection, unspecified: Secondary | ICD-10-CM | POA: Insufficient documentation

## 2019-03-01 DIAGNOSIS — R202 Paresthesia of skin: Secondary | ICD-10-CM | POA: Diagnosis not present

## 2019-03-01 DIAGNOSIS — Z20822 Contact with and (suspected) exposure to covid-19: Secondary | ICD-10-CM | POA: Diagnosis not present

## 2019-03-01 DIAGNOSIS — I10 Essential (primary) hypertension: Secondary | ICD-10-CM | POA: Diagnosis not present

## 2019-03-01 DIAGNOSIS — E785 Hyperlipidemia, unspecified: Secondary | ICD-10-CM | POA: Insufficient documentation

## 2019-03-01 DIAGNOSIS — J310 Chronic rhinitis: Secondary | ICD-10-CM | POA: Diagnosis not present

## 2019-03-01 DIAGNOSIS — R05 Cough: Secondary | ICD-10-CM | POA: Insufficient documentation

## 2019-03-01 DIAGNOSIS — R030 Elevated blood-pressure reading, without diagnosis of hypertension: Secondary | ICD-10-CM

## 2019-03-01 NOTE — ED Provider Notes (Signed)
[place]   MRN: [id number] DOB: 04-Nov-1980  Subjective:   Johnny [name] is a 39 y.o. male presenting for 4-day history of mild to moderate acute onset throat pain with occasional cough.  States that the throat pain is somewhat improved and the cough is mild, declines any medications for this.  He is very anxious and reports tingling chest discomfort on either side of his sternum. Denies COVID 19 contacts.   No current facility-administered medications for this encounter.  Current Outpatient Medications:  .  amLODipine (NORVASC) 5 MG tablet, Take 1 tablet (5 mg total) by mouth daily. Patient needs a follow up appt, Disp: 30 tablet, Rfl: 0 .  atorvastatin (LIPITOR) 20 MG tablet, Take 1 tablet (20 mg total) by mouth daily at 6 PM., Disp: 90 tablet, Rfl: 3 .  Cholecalciferol (VITAMIN D3) 2000 units TABS, Take by mouth., Disp: , Rfl:  .  dexlansoprazole (DEXILANT) 60 MG capsule, Take by mouth., Disp: , Rfl:  .  gabapentin (NEURONTIN) 100 MG capsule, TAKE 2 CAPSULES (200 MG TOTAL) BY MOUTH AT BEDTIME., Disp: 180 capsule, Rfl: 2 .  mesalamine (LIALDA) 1.2 g EC tablet, Take by mouth., Disp: , Rfl:  .  Vitamin D, Ergocalciferol, (DRISDOL) 1.25 MG (50000 UT) CAPS capsule, TAKE 1 CAPSULE (50,000 UNITS TOTAL) BY MOUTH EVERY 7 (SEVEN) DAYS., Disp: 12 capsule, Rfl: 0 .  cyclobenzaprine (FLEXERIL) 10 MG tablet, Take 1 tablet (10 mg total) by mouth 3 (three) times daily as needed for muscle spasms., Disp: 30 tablet, Rfl: 0 .  diclofenac (VOLTAREN) 75 MG EC tablet, TAKE 1 TABLET BY MOUTH TWICE A DAY, Disp: 60 tablet, Rfl: 0 .  Diclofenac Sodium 2 % SOLN, Place 2 g onto the skin 2 (two) times daily., Disp: 112 g, Rfl: 3 .  triamcinolone cream (KENALOG) 0.5 %, Apply topically 2 (two) times daily., Disp: 30 g, Rfl: 1   Allergies  Allergen Reactions  . Codeine Nausea Only  . Nsaids   . Other     Champagne causes vomiting, diarrhea, and nausea.    Past Medical History:  Diagnosis Date  .  Crohn's disease ([place])   . GERD (gastroesophageal reflux disease)   . Hyperlipidemia   . Hypertension      Past Surgical History:  Procedure Laterality Date  . LIVER BIOPSY      History reviewed. No pertinent family history.  Social History   Tobacco Use  . Smoking status: Never Smoker  . Smokeless tobacco: Never Used  Substance Use Topics  . Alcohol use: No  . Drug use: No    Review of Systems  Constitutional: Negative for fever and malaise/fatigue.  HENT: Positive for sore throat. Negative for congestion, ear pain and sinus pain.   Eyes: Negative for discharge and redness.  Respiratory: Positive for cough. Negative for hemoptysis, shortness of breath and wheezing.   Cardiovascular: Positive for chest pain (just lateral to lower sternum on either side).  Gastrointestinal: Negative for abdominal pain, diarrhea, nausea and vomiting.  Genitourinary: Negative for dysuria, flank pain and hematuria.  Musculoskeletal: Negative for myalgias.  Skin: Negative for rash.  Neurological: Negative for dizziness, weakness and headaches.  Psychiatric/Behavioral: Negative for depression and substance abuse.     Objective:   Vitals: BP (!) 142/89 (BP Location: Left Arm)   Pulse 86   Temp 99.2 F (37.3 C) (Oral)   Resp 18   SpO2 99%   Physical Exam Constitutional:      General: He is not  in acute distress.    Appearance: Normal appearance. He is well-developed. He is not ill-appearing, toxic-appearing or diaphoretic.  HENT:     Head: Normocephalic and atraumatic.     Right Ear: External ear normal.     Left Ear: External ear normal.     Nose: Nose normal.     Mouth/Throat:     Mouth: Mucous membranes are moist.     Pharynx: Oropharynx is clear.  Eyes:     General: No scleral icterus.    Extraocular Movements: Extraocular movements intact.     Pupils: Pupils are equal, round, and reactive to light.  Cardiovascular:     Rate and Rhythm: Normal rate and regular rhythm.      Heart sounds: Normal heart sounds. No murmur. No friction rub. No gallop.   Pulmonary:     Effort: Pulmonary effort is normal. No respiratory distress.     Breath sounds: Normal breath sounds. No stridor. No wheezing, rhonchi or rales.  Neurological:     Mental Status: He is alert and oriented to person, place, and time.  Psychiatric:        Mood and Affect: Mood normal.        Behavior: Behavior normal.        Thought Content: Thought content normal.     ED ECG REPORT   Date: 03/01/2019  Rate: 80bpm  Rhythm: normal sinus rhythm  QRS Axis: normal  Intervals: normal  ST/T Wave abnormalities: nonspecific T wave changes  Conduction Disutrbances:none  Narrative Interpretation: Sinus rhythm at 80bpm with non-specific T-wave flattening at III, V2.   Old EKG Reviewed: none available  I have personally reviewed the EKG tracing and agree with the computerized printout as noted.   Assessment and Plan :   1. Nonspecific chest pain   2. Throat pain   3. Cough   4. Viral illness   5. Essential hypertension   6. Elevated blood pressure reading     Will manage for viral illness such as viral URI, viral rhinitis, possible COVID-19. Counseled patient on nature of COVID-19 including modes of transmission, diagnostic testing, management and supportive care.  Offered symptomatic relief. COVID 19 testing is pending. Counseled patient on potential for adverse effects with medications prescribed/recommended today, ER and return-to-clinic precautions discussed, patient verbalized understanding.     Jaynee Eagles, [place] 03/01/19 Bosie Helper

## 2019-03-01 NOTE — ED Triage Notes (Signed)
Sore throat on Monday that went away.  patient has chest burning and tingling, anxious.  Patient wants to make sure this is not covid.    Denies nausea, vomiting, diarrhea, no fever-has checked temperature several times today and normal each time

## 2019-03-01 NOTE — Discharge Instructions (Addendum)

## 2019-03-03 LAB — NOVEL CORONAVIRUS, NAA (HOSP ORDER, SEND-OUT TO REF LAB; TAT 18-24 HRS): SARS-CoV-2, NAA: NOT DETECTED

## 2019-03-08 ENCOUNTER — Other Ambulatory Visit: Payer: Self-pay

## 2019-03-08 ENCOUNTER — Ambulatory Visit: Payer: Managed Care, Other (non HMO) | Admitting: Family Medicine

## 2019-03-08 ENCOUNTER — Encounter: Payer: Self-pay | Admitting: Family Medicine

## 2019-03-08 VITALS — BP 110/70 | HR 77 | Ht 76.0 in | Wt 243.0 lb

## 2019-03-08 DIAGNOSIS — M545 Low back pain, unspecified: Secondary | ICD-10-CM

## 2019-03-08 DIAGNOSIS — M999 Biomechanical lesion, unspecified: Secondary | ICD-10-CM | POA: Diagnosis not present

## 2019-03-08 DIAGNOSIS — M2550 Pain in unspecified joint: Secondary | ICD-10-CM

## 2019-03-08 DIAGNOSIS — G8929 Other chronic pain: Secondary | ICD-10-CM

## 2019-03-08 MED ORDER — KETOROLAC TROMETHAMINE 60 MG/2ML IM SOLN
60.0000 mg | Freq: Once | INTRAMUSCULAR | Status: AC
  Administered 2019-03-08: 60 mg via INTRAMUSCULAR

## 2019-03-08 MED ORDER — METHYLPREDNISOLONE ACETATE 80 MG/ML IJ SUSP
80.0000 mg | Freq: Once | INTRAMUSCULAR | Status: AC
  Administered 2019-03-08: 80 mg via INTRAMUSCULAR

## 2019-03-08 NOTE — Progress Notes (Signed)
[name] Phone: [phone] Subjective:   Johnny [name], am serving as a scribe for Dr. Hulan Saas. This visit occurred during the SARS-CoV-2 public health emergency.  Safety protocols were in place, including screening questions prior to the visit, additional usage of staff PPE, and extensive cleaning of exam room while observing appropriate contact time as indicated for disinfecting solutions.     CC: Back pain follow-up  [id number]  Johnny [name] is a 39 y.o. male coming in with complaint of back pain. Patient last seen for OMT on 02/01/2019. Patient states that he has been having radiating symptoms down the back of his leg to his knee. Back pain is worse when he performs lumbar flexion. Patient's pain is alleviated by lying in recliner. Patient was on NSAIDs but cannot use them so has been using Tylenol for pain.     Past Medical History:  Diagnosis Date  . Crohn's disease ([place])   . GERD (gastroesophageal reflux disease)   . Hyperlipidemia   . Hypertension    Past Surgical History:  Procedure Laterality Date  . LIVER BIOPSY     Social History   Socioeconomic History  . Marital status: Married    Spouse name: Not on file  . Number of children: Not on file  . Years of education: Not on file  . Highest education level: Not on file  Occupational History  . Not on file  Tobacco Use  . Smoking status: Never Smoker  . Smokeless tobacco: Never Used  Substance and Sexual Activity  . Alcohol use: [name]  . Drug use: [name]  . Sexual activity: Yes  Other Topics Concern  . Not on file  Social History Narrative  . Not on file   Social Determinants of Health   Financial Resource Strain:   . Difficulty of Paying Living Expenses: Not on file  Food Insecurity:   . Worried About Charity fundraiser in the Last Year: Not on file  . Ran Out of Food in the Last Year: Not on file  Transportation Needs:     . Lack of Transportation (Medical): Not on file  . Lack of Transportation (Non-Medical): Not on file  Physical Activity:   . Days of Exercise per Week: Not on file  . Minutes of Exercise per Session: Not on file  Stress:   . Feeling of Stress : Not on file  Social Connections:   . Frequency of Communication with Friends and Family: Not on file  . Frequency of Social Gatherings with Friends and Family: Not on file  . Attends Religious Services: Not on file  . Active Member of Clubs or Organizations: Not on file  . Attends Archivist Meetings: Not on file  . Marital Status: Not on file   Allergies  Allergen Reactions  . Codeine Nausea Only  . Nsaids   . Other     Champagne causes vomiting, diarrhea, and nausea.   [name] family history on file.   Current Outpatient Medications (Cardiovascular):  .  amLODipine (NORVASC) 5 MG tablet, Take 1 tablet (5 mg total) by mouth daily. Patient needs a follow up appt .  atorvastatin (LIPITOR) 20 MG tablet, Take 1 tablet (20 mg total) by mouth daily at 6 PM.     Current Outpatient Medications (Other):  [name] Kitchen  Cholecalciferol (VITAMIN D3) 2000 units TABS, Take by mouth. .  dexlansoprazole (DEXILANT) 60 MG capsule, Take by mouth. [name] Kitchen  gabapentin (NEURONTIN) 100 MG capsule, TAKE 2 CAPSULES (200 MG TOTAL) BY MOUTH AT BEDTIME. .  mesalamine (LIALDA) 1.2 g EC tablet, Take by mouth. .  triamcinolone cream (KENALOG) 0.5 %, Apply topically 2 (two) times daily. .  Vitamin D, Ergocalciferol, (DRISDOL) 1.25 MG (50000 UT) CAPS capsule, TAKE 1 CAPSULE (50,000 UNITS TOTAL) BY MOUTH EVERY 7 (SEVEN) DAYS.    Past medical history, social, surgical and family history all reviewed in electronic medical record.  [name] information unless stated regarding to the chief complaint.   Review of Systems:  [name], visual changes, nausea, vomiting, diarrhea, constipation, dizziness, abdominal pain, skin rash, fevers, chills, night sweats, weight loss,  swollen lymph nodes, body aches, joint swelling, chest pain, shortness of breath, mood changes.  Positive muscle aches  Objective  Blood pressure 110/70, pulse 77, height 6' 4"  (1.93 m), weight 243 lb (110.2 kg), SpO2 99 %.    General: [name] apparent distress alert and oriented x3 mood and affect normal, dressed appropriately.  HEENT: Pupils equal, extraocular movements intact  Respiratory: Patient's speak in full sentences and does not appear short of breath  Cardiovascular: [name] lower extremity edema, non tender, [name] erythema  Skin: Warm dry intact with [name] signs of infection or rash on extremities or on axial skeleton.  Abdomen: Soft nontender  Neuro: Cranial nerves II through XII are intact, neurovascularly intact in all extremities with 2+ DTRs and 2+ pulses.  Lymph: [name] lymphadenopathy of posterior or anterior cervical chain or axillae bilaterally.  Gait normal with good balance and coordination.  MSK:  Non tender with full range of motion and good stability and symmetric strength and tone of shoulders, elbows, wrist, hip, knee and ankles bilaterally.  Neck exam shows loss of lordosis.  Tender to palpation in the paraspinal musculature lumbar spine right greater than left.  Patient is significant more stiff than usual.  Unable to even do Corky Sox secondary to stiffness.  Osteopathic findings T6 extended rotated and side bent right L1 flexed rotated and side bent right L4 flexed rotated side bent right Sacrum left on left   Impression and Recommendations:     This case required medical decision making of moderate complexity. The above documentation has been reviewed and is accurate and complete [name], DO       Note: This dictation was prepared with Dragon dictation along with smaller phrase technology. Any transcriptional errors that result from this process are unintentional.

## 2019-03-08 NOTE — Patient Instructions (Addendum)
Increase gabapentin to 346m at night  See me again in 4 weeks Cymbalta or Effexor

## 2019-03-08 NOTE — Assessment & Plan Note (Signed)
Mild worsening at this time.  Discussed with patient about posture and ergonomics, we discussed which activities to do which wants to avoid.  Patient is to increase activity slowly over the course the next 5 to 8 weeks.

## 2019-03-08 NOTE — Assessment & Plan Note (Signed)
Decision today to treat with OMT was based on Physical Exam  After verbal consent patient was treated with HVLA, ME, FPR techniques in , thoracic, lumbar and sacral areas  Patient tolerated the procedure well with improvement in symptoms  Patient given exercises, stretches and lifestyle modifications  See medications in patient instructions if given  Patient will follow up in 4-8 weeks

## 2019-03-13 ENCOUNTER — Other Ambulatory Visit: Payer: Self-pay | Admitting: Family Medicine

## 2019-03-22 ENCOUNTER — Other Ambulatory Visit: Payer: Self-pay | Admitting: Family Medicine

## 2019-03-22 NOTE — Telephone Encounter (Signed)
Last OV 11/29/18 Last refill 06/22/18 #30 g / 1 Next OV not scheduled

## 2019-03-23 ENCOUNTER — Other Ambulatory Visit: Payer: Self-pay

## 2019-03-23 MED ORDER — ATORVASTATIN CALCIUM 20 MG PO TABS: 20.0000 mg | ORAL_TABLET | Freq: Every day | ORAL | 3 refills | Status: DC

## 2019-04-12 ENCOUNTER — Ambulatory Visit: Payer: Managed Care, Other (non HMO) | Admitting: Family Medicine

## 2019-04-17 ENCOUNTER — Other Ambulatory Visit: Payer: Self-pay

## 2019-04-17 MED ORDER — AMLODIPINE BESYLATE 5 MG PO TABS: 5.0000 mg | ORAL_TABLET | Freq: Every day | ORAL | 0 refills | Status: DC

## 2019-04-25 ENCOUNTER — Encounter: Payer: Self-pay | Admitting: Family Medicine

## 2019-04-25 ENCOUNTER — Ambulatory Visit: Payer: Managed Care, Other (non HMO) | Admitting: Family Medicine

## 2019-04-25 ENCOUNTER — Other Ambulatory Visit: Payer: Self-pay

## 2019-04-25 VITALS — BP 126/70 | HR 69 | Ht 76.0 in | Wt 246.0 lb

## 2019-04-25 DIAGNOSIS — G8929 Other chronic pain: Secondary | ICD-10-CM

## 2019-04-25 DIAGNOSIS — M545 Low back pain: Secondary | ICD-10-CM

## 2019-04-25 DIAGNOSIS — M999 Biomechanical lesion, unspecified: Secondary | ICD-10-CM

## 2019-04-25 MED ORDER — GABAPENTIN 400 MG PO CAPS: 400.0000 mg | ORAL_CAPSULE | Freq: Every day | ORAL | 1 refills | Status: DC

## 2019-04-25 NOTE — Progress Notes (Signed)
[place] [address] [place] Phone: [phone] Subjective:   I Johnny [name] am serving as a Education administrator for Dr. Hulan [name].  This visit occurred during the SARS-CoV-2 public health emergency.  Safety protocols were in place, including screening questions prior to the visit, additional usage of staff PPE, and extensive cleaning of exam room while observing appropriate contact time as indicated for disinfecting solutions.   I'm seeing this patient by the request  of:  Johnny Barrack, MD  CC: Low back pain follow-up  [id number]  Johnny [name] is a 39 y.o. male coming in with complaint of back pain. Last seen on 03/15/2019 for OMT. Patient states he is still in pain and still stiff.  Patient states that it is aggravating but does not stop him from anything.  Patient rates the severity of pain a 7 out of      Past Medical History:  Diagnosis Date  . Crohn's disease ([place])   . GERD (gastroesophageal reflux disease)   . Hyperlipidemia   . Hypertension    Past Surgical History:  Procedure Laterality Date  . LIVER BIOPSY     Social History   Socioeconomic History  . Marital status: Married    Spouse name: Not on file  . Number of children: Not on file  . Years of education: Not on file  . Highest education level: Not on file  Occupational History  . Not on file  Tobacco Use  . Smoking status: Never Smoker  . Smokeless tobacco: Never Used  Substance and Sexual Activity  . Alcohol use: No  . Drug use: No  . Sexual activity: Yes  Other Topics Concern  . Not on file  Social History Narrative  . Not on file   Social Determinants of Health   Financial Resource Strain:   . Difficulty of Paying Living Expenses: Not on file  Food Insecurity:   . Worried About Charity fundraiser in the Last Year: Not on file  . Ran Out of Food in the Last Year: Not on file  Transportation Needs:   . Lack of Transportation (Medical):  Not on file  . Lack of Transportation (Non-Medical): Not on file  Physical Activity:   . Days of Exercise per Week: Not on file  . Minutes of Exercise per Session: Not on file  Stress:   . Feeling of Stress : Not on file  Social Connections:   . Frequency of Communication with Friends and Family: Not on file  . Frequency of Social Gatherings with Friends and Family: Not on file  . Attends Religious Services: Not on file  . Active Member of Clubs or Organizations: Not on file  . Attends Archivist Meetings: Not on file  . Marital Status: Not on file   Allergies  Allergen Reactions  . Codeine Nausea Only  . Nsaids   . Other     Champagne causes vomiting, diarrhea, and nausea.   No family history on file.   Current Outpatient Medications (Cardiovascular):  .  amLODipine (NORVASC) 5 MG tablet, Take 1 tablet (5 mg total) by mouth daily. Patient needs a follow up appt .  atorvastatin (LIPITOR) 20 MG tablet, Take 1 tablet (20 mg total) by mouth daily at 6 PM.     Current Outpatient Medications (Other):  [name] Kitchen  Cholecalciferol (VITAMIN D3) 2000 units TABS, Take by mouth. .  dexlansoprazole (DEXILANT) 60 MG capsule, Take by mouth. [name] Kitchen  gabapentin (NEURONTIN) 100 MG capsule, TAKE 2 CAPSULES (200 MG TOTAL) BY MOUTH AT BEDTIME. .  mesalamine (LIALDA) 1.2 g EC tablet, Take by mouth. .  triamcinolone cream (KENALOG) 0.5 %, APPLY TO AFFECTED AREA TWICE A DAY .  Vitamin D, Ergocalciferol, (DRISDOL) 1.25 MG (50000 UT) CAPS capsule, TAKE 1 CAPSULE (50,000 UNITS TOTAL) BY MOUTH EVERY 7 (SEVEN) DAYS. [name] Kitchen  gabapentin (NEURONTIN) 400 MG capsule, Take 1 capsule (400 mg total) by mouth at bedtime.   Reviewed prior external information including notes and imaging from  primary care provider As well as notes that were available from care everywhere and other healthcare systems.  Past medical history, social, surgical and family history all reviewed in electronic medical record.  No pertanent  information unless stated regarding to the chief complaint.   Review of Systems:  No headache, visual changes, nausea, vomiting, diarrhea, constipation, dizziness, abdominal pain, skin rash, fevers, chills, night sweats, weight loss, swollen lymph nodes, body aches, joint swelling, chest pain, shortness of breath, mood changes. POSITIVE muscle aches  Objective  Blood pressure 126/70, pulse 69, height 6' 4"  (1.93 m), weight 246 lb (111.6 kg), SpO2 97 %.   General: No apparent distress alert and oriented x3 mood and affect normal, dressed appropriately.  HEENT: Pupils equal, extraocular movements intact  Respiratory: Patient's speak in full sentences and does not appear short of breath  Cardiovascular: No lower extremity edema, non tender, no erythema  Skin: Warm dry intact with no signs of infection or rash on extremities or on axial skeleton.  Abdomen: Soft nontender  Neuro: Cranial nerves II through XII are intact, neurovascularly intact in all extremities with 2+ DTRs and 2+ pulses.  Lymph: No lymphadenopathy of posterior or anterior cervical chain or axillae bilaterally.  Gait normal with good balance and coordination.  MSK:  tender with full range of motion and good stability and symmetric strength and tone of shoulders, elbows, wrist, hip, knee and ankles bilaterally.  Low back exam shows the patient does have some mild tender to palpation in the paraspinal musculature more in the thoracolumbar and some in the lumbosacral area.  Left greater than right.  Negative straight leg test, tightness with Johnny [name] test bilaterally.  Osteopathic findings  T9 extended rotated and side bent left L2 flexed rotated and side bent right Sacrum right on right    Impression and Recommendations:     This case required medical decision making of moderate complexity. The above documentation has been reviewed and is accurate and complete Johnny Pulley, DO       Note: This dictation was prepared  with Dragon dictation along with smaller phrase technology. Any transcriptional errors that result from this process are unintentional.

## 2019-04-25 NOTE — Assessment & Plan Note (Signed)
Decision today to treat with OMT was based on Physical Exam  After verbal consent patient was treated with HVLA, ME, FPR techniques in thoracic, lumbar and sacral areas  Patient tolerated the procedure well with improvement in symptoms  Patient given exercises, stretches and lifestyle modifications  See medications in patient instructions if given  Patient will follow up in 4-8 weeks

## 2019-04-25 NOTE — Assessment & Plan Note (Addendum)
Multifactorial.  I do believe that some of it seems to be secondary to likely Crohn's disease and abnormal absorption.  Discussed which activities to do which wants to avoid.  Increase activity only over the course the next several weeks.  Follow-up again in 4 to 8 weeks increase gabapentin to 400 mg at night discussed medical management

## 2019-04-25 NOTE — Patient Instructions (Addendum)
Gabapentin 478m at night See me in 7-8 weeks

## 2019-05-29 ENCOUNTER — Other Ambulatory Visit: Payer: Self-pay | Admitting: Family Medicine

## 2019-06-15 ENCOUNTER — Other Ambulatory Visit: Payer: Self-pay | Admitting: Family Medicine

## 2019-06-21 ENCOUNTER — Other Ambulatory Visit: Payer: Self-pay

## 2019-06-21 ENCOUNTER — Ambulatory Visit: Payer: PRIVATE HEALTH INSURANCE | Admitting: Family Medicine

## 2019-06-21 ENCOUNTER — Encounter: Payer: Self-pay | Admitting: Family Medicine

## 2019-06-21 VITALS — BP 116/76 | HR 77 | Ht 76.0 in | Wt 246.0 lb

## 2019-06-21 DIAGNOSIS — M545 Low back pain, unspecified: Secondary | ICD-10-CM

## 2019-06-21 DIAGNOSIS — M999 Biomechanical lesion, unspecified: Secondary | ICD-10-CM | POA: Diagnosis not present

## 2019-06-21 DIAGNOSIS — G8929 Other chronic pain: Secondary | ICD-10-CM | POA: Diagnosis not present

## 2019-06-21 NOTE — Progress Notes (Signed)
Johnny [name] Phone: [phone] Subjective:   Johnny [name], am serving as a scribe for Dr. Hulan Saas. This visit occurred during the SARS-CoV-2 public health emergency.  Safety protocols were in place, including screening questions prior to the visit, additional usage of staff PPE, and extensive cleaning of exam room while observing appropriate contact time as indicated for disinfecting solutions.   I'm seeing this patient by the request  of:  Vivi Barrack, MD  CC: Chronic problem with exacerbation.  Low back pain  HQI:[id number]  Johnny [name] is a 39 y.o. male coming in with complaint of back pain. Last seen on 04/25/2019 for OMT. Patient states that he continues to have constant pain that is exacerbated by trunk flexion and improved with extension. Pain is radiating in lateral side of upper portion of left leg.  Patient states that it is just affecting even his mood from time to time now.  Patient feels like he is driving down everything right     Past Medical History:  Diagnosis Date  . Crohn's disease ([place])   . GERD (gastroesophageal reflux disease)   . Hyperlipidemia   . Hypertension    Past Surgical History:  Procedure Laterality Date  . LIVER BIOPSY     Social History   Socioeconomic History  . Marital status: Married    Spouse name: Not on file  . Number of children: Not on file  . Years of education: Not on file  . Highest education level: Not on file  Occupational History  . Not on file  Tobacco Use  . Smoking status: Never Smoker  . Smokeless tobacco: Never Used  Substance and Sexual Activity  . Alcohol use: [name]  . Drug use: [name]  . Sexual activity: Yes  Other Topics Concern  . Not on file  Social History Narrative  . Not on file   Social Determinants of Health   Financial Resource Strain:   . Difficulty of Paying Living Expenses:   Food Insecurity:   . Worried About Paediatric nurse in the Last Year:   . Arboriculturist in the Last Year:   Transportation Needs:   . Film/video editor (Medical):   [name] Kitchen Lack of Transportation (Non-Medical):   Physical Activity:   . Days of Exercise per Week:   . Minutes of Exercise per Session:   Stress:   . Feeling of Stress :   Social Connections:   . Frequency of Communication with Friends and Family:   . Frequency of Social Gatherings with Friends and Family:   . Attends Religious Services:   . Active Member of Clubs or Organizations:   . Attends Archivist Meetings:   [name] Kitchen Marital Status:    Allergies  Allergen Reactions  . Codeine Nausea Only  . Nsaids   . Other     Champagne causes vomiting, diarrhea, and nausea.   [name] family history on file.   Current Outpatient Medications (Cardiovascular):  .  amLODipine (NORVASC) 5 MG tablet, TAKE 1 TABLET (5 MG TOTAL) BY MOUTH DAILY. PATIENT NEEDS A FOLLOW UP APPT .  atorvastatin (LIPITOR) 20 MG tablet, Take 1 tablet (20 mg total) by mouth daily at 6 PM.     Current Outpatient Medications (Other):  [name] Kitchen  Cholecalciferol (VITAMIN D3) 2000 units TABS, Take by mouth. .  dexlansoprazole (DEXILANT) 60 MG capsule, Take by mouth. .  gabapentin (NEURONTIN) 100  MG capsule, TAKE 2 CAPSULES (200 MG TOTAL) BY MOUTH AT BEDTIME. [name] Kitchen  gabapentin (NEURONTIN) 400 MG capsule, Take 1 capsule (400 mg total) by mouth at bedtime. .  mesalamine (LIALDA) 1.2 g EC tablet, Take by mouth. .  triamcinolone cream (KENALOG) 0.5 %, APPLY TO AFFECTED AREA TWICE A DAY .  Vitamin D, Ergocalciferol, (DRISDOL) 1.25 MG (50000 UNIT) CAPS capsule, TAKE 1 CAPSULE (50,000 UNITS TOTAL) BY MOUTH EVERY 7 (SEVEN) DAYS.   Reviewed prior external information including notes and imaging from  primary care provider As well as notes that were available from care everywhere and other healthcare systems.  Past medical history, social, surgical and family history all reviewed in electronic medical record.   [name] information unless stated regarding to the chief complaint.   Review of Systems:  [name], visual changes, nausea, vomiting, diarrhea, constipation, dizziness, abdominal pain, skin rash, fevers, chills, night sweats, weight loss, swollen lymph nodes, body aches, joint swelling, chest pain, shortness of breath, mood changes. POSITIVE muscle aches  Objective  Blood pressure 116/76, pulse 77, height 6' 4"  (1.93 m), weight 246 lb (111.6 kg), SpO2 98 %.   General: [name] apparent distress alert and oriented x3 mood and affect normal, dressed appropriately.  HEENT: Pupils equal, extraocular movements intact  Respiratory: Patient's speak in full sentences and does not appear short of breath  Cardiovascular: [name] lower extremity edema, non tender, [name] erythema  Neuro: Cranial nerves II through XII are intact, neurovascularly intact in all extremities with 2+ DTRs and 2+ pulses.  Gait normal with good balance and coordination.  MSK:  tender with full range of motion and good stability and symmetric strength and tone of shoulders, elbows, wrist, hip, knee and ankles bilaterally.  Back exam does have some loss of lordosis.  Some tender to palpation of paraspinal musculature lumbar spine right greater than left.  Mild positive Corky Sox on the right.  Mild positive straight leg test.  5 out of 5 strength of the legs noted.  Patient does have limited range of motion in all planes.  Osteopathic findings   T8 extended rotated and side bent left L2 flexed rotated and side bent right Sacrum right on right    Impression and Recommendations:     This case required medical decision making of moderate complexity. The above documentation has been reviewed and is accurate and complete [name], DO       Note: This dictation was prepared with Dragon dictation along with smaller phrase technology. Any transcriptional errors that result from this process are unintentional.

## 2019-06-21 NOTE — Assessment & Plan Note (Addendum)

## 2019-06-21 NOTE — Assessment & Plan Note (Signed)
Continues to have low back pain that is out of proportion to the amount of pain noted on exam today.  Patient has failed all conservative therapy including formal physical therapy, patient including gabapentin, osteopathic manipulation.  History of Crohn's disease but it does not seem to be more secondary to constipation.  At this time we would like advanced imaging and patient will have MRI ordered today.  Depending on these findings patient.  Potential epidurals.

## 2019-06-21 NOTE — Patient Instructions (Signed)
See me in 4-5 weeks [facility] [phone]

## 2019-06-22 ENCOUNTER — Encounter: Payer: Self-pay | Admitting: Family Medicine

## 2019-07-10 ENCOUNTER — Ambulatory Visit (INDEPENDENT_AMBULATORY_CARE_PROVIDER_SITE_OTHER): Payer: PRIVATE HEALTH INSURANCE | Admitting: Family Medicine

## 2019-07-10 ENCOUNTER — Encounter: Payer: Self-pay | Admitting: Family Medicine

## 2019-07-10 ENCOUNTER — Other Ambulatory Visit: Payer: Self-pay

## 2019-07-10 VITALS — BP 140/80 | HR 87 | Temp 98.2°F | Ht 76.0 in | Wt 248.0 lb

## 2019-07-10 DIAGNOSIS — I10 Essential (primary) hypertension: Secondary | ICD-10-CM | POA: Diagnosis not present

## 2019-07-10 DIAGNOSIS — M545 Low back pain, unspecified: Secondary | ICD-10-CM

## 2019-07-10 DIAGNOSIS — E669 Obesity, unspecified: Secondary | ICD-10-CM

## 2019-07-10 DIAGNOSIS — E559 Vitamin D deficiency, unspecified: Secondary | ICD-10-CM

## 2019-07-10 DIAGNOSIS — K219 Gastro-esophageal reflux disease without esophagitis: Secondary | ICD-10-CM

## 2019-07-10 DIAGNOSIS — Z6830 Body mass index (BMI) 30.0-30.9, adult: Secondary | ICD-10-CM

## 2019-07-10 DIAGNOSIS — Z0001 Encounter for general adult medical examination with abnormal findings: Secondary | ICD-10-CM | POA: Diagnosis not present

## 2019-07-10 DIAGNOSIS — R7303 Prediabetes: Secondary | ICD-10-CM | POA: Diagnosis not present

## 2019-07-10 DIAGNOSIS — L281 Prurigo nodularis: Secondary | ICD-10-CM

## 2019-07-10 DIAGNOSIS — E785 Hyperlipidemia, unspecified: Secondary | ICD-10-CM

## 2019-07-10 DIAGNOSIS — G8929 Other chronic pain: Secondary | ICD-10-CM

## 2019-07-10 LAB — COMPREHENSIVE METABOLIC PANEL
ALT: 32 U/L (ref 0–53)
AST: 19 U/L (ref 0–37)
Albumin: 4.7 g/dL (ref 3.5–5.2)
Alkaline Phosphatase: 64 U/L (ref 39–117)
BUN: 12 mg/dL (ref 6–23)
CO2: 28 mEq/L (ref 19–32)
Calcium: 9.3 mg/dL (ref 8.4–10.5)
Chloride: 102 mEq/L (ref 96–112)
Creatinine, Ser: 0.89 mg/dL (ref 0.40–1.50)
GFR: 94.69 mL/min (ref >60.00)
Glucose, Bld: 120 mg/dL — ABNORMAL HIGH (ref 70–99)
Potassium: 3.8 mEq/L (ref 3.5–5.1)
Sodium: 137 mEq/L (ref 135–145)
Total Bilirubin: 0.5 mg/dL (ref 0.2–1.2)
Total Protein: 7.4 g/dL (ref 6.0–8.3)

## 2019-07-10 LAB — CBC
HCT: 44.4 % (ref 39.0–52.0)
Hemoglobin: 14.1 g/dL (ref 13.0–17.0)
MCHC: 31.8 g/dL (ref 30.0–36.0)
MCV: 73.4 fl — ABNORMAL LOW (ref 78.0–100.0)
Platelets: 239 10*3/uL (ref 150.0–400.0)
RBC: 6.04 Mil/uL — ABNORMAL HIGH (ref 4.22–5.81)
RDW: 13.6 % (ref 11.5–15.5)
WBC: 7.5 10*3/uL (ref 4.0–10.5)

## 2019-07-10 LAB — LIPID PANEL
Cholesterol: 175 mg/dL (ref 0–200)
HDL: 38.5 mg/dL — ABNORMAL LOW (ref >39.00)
NonHDL: 136.57
Total CHOL/HDL Ratio: 5
Triglycerides: 355 mg/dL — ABNORMAL HIGH (ref 0.0–149.0)
VLDL: 71 mg/dL — ABNORMAL HIGH (ref 0.0–40.0)

## 2019-07-10 LAB — HEMOGLOBIN A1C: Hgb A1c MFr Bld: 6 % (ref 4.6–6.5)

## 2019-07-10 LAB — LDL CHOLESTEROL, DIRECT: Direct LDL: 105 mg/dL

## 2019-07-10 MED ORDER — AMLODIPINE BESYLATE 5 MG PO TABS: 5.0000 mg | ORAL_TABLET | Freq: Every day | ORAL | 3 refills | Status: DC

## 2019-07-10 MED ORDER — ATORVASTATIN CALCIUM 10 MG PO TABS: 10.0000 mg | ORAL_TABLET | Freq: Every day | ORAL | 3 refills | Status: DC

## 2019-07-10 MED ORDER — BETAMETHASONE VALERATE 0.1 % EX OINT: 1.0000 "application " | TOPICAL_OINTMENT | Freq: Two times a day (BID) | CUTANEOUS | 0 refills | Status: AC

## 2019-07-10 NOTE — Progress Notes (Signed)
B

## 2019-07-10 NOTE — Assessment & Plan Note (Signed)
Stable.  Continue Dexilant per GI.

## 2019-07-10 NOTE — Assessment & Plan Note (Signed)
Having mild muscle cramps.  Will decrease Lipitor to 10 mg daily.  Check lipid panel.  Continue last modifications.

## 2019-07-10 NOTE — Assessment & Plan Note (Signed)
At goal.  Continue amlodipine 5 mg daily.

## 2019-07-10 NOTE — Assessment & Plan Note (Signed)
Check A1c, CBC, C met, TSH.

## 2019-07-10 NOTE — Assessment & Plan Note (Signed)
Check vitamin D level.  Continue supplementation.

## 2019-07-10 NOTE — Patient Instructions (Signed)
It was very nice to see you today!  Please try using the betamethasone cream on the itchy spots.  You can also use capsaicin cream.  We will check blood work today.  Decrease your Lipitor to 10 mg daily.  No other changes today.  We will see back in year for your next checkup.  Please come back sooner if needed.  Take care, Dr Jerline Pain  Please try these tips to maintain a healthy lifestyle:   Eat at least 3 REAL meals and 1-2 snacks per day.  Aim for no more than 5 hours between eating.  If you eat breakfast, please do so within one hour of getting up.    Each meal should contain half fruits/vegetables, one quarter protein, and one quarter carbs (no bigger than a computer mouse)   Cut down on sweet beverages. This includes juice, soda, and sweet tea.     Drink at least 1 glass of water with each meal and aim for at least 8 glasses per day   Exercise at least 150 minutes every week.    Preventive Care 48-57 Years Old, Male Preventive care refers to lifestyle choices and visits with your health care provider that can promote health and wellness. This includes:  A yearly physical exam. This is also called an annual well check.  Regular dental and eye exams.  Immunizations.  Screening for certain conditions.  Healthy lifestyle choices, such as eating a healthy diet, getting regular exercise, not using drugs or products that contain nicotine and tobacco, and limiting alcohol use. What can I expect for my preventive care visit? Physical exam Your health care provider will check:  Height and weight. These may be used to calculate body mass index (BMI), which is a measurement that tells if you are at a healthy weight.  Heart rate and blood pressure.  Your skin for abnormal spots. Counseling Your health care provider may ask you questions about:  Alcohol, tobacco, and drug use.  Emotional well-being.  Home and relationship well-being.  Sexual activity.  Eating  habits.  Work and work Statistician. What immunizations do I need?  Influenza (flu) vaccine  This is recommended every year. Tetanus, diphtheria, and pertussis (Tdap) vaccine  You may need a Td booster every 10 years. Varicella (chickenpox) vaccine  You may need this vaccine if you have not already been vaccinated. Human papillomavirus (HPV) vaccine  If recommended by your health care provider, you may need three doses over 6 months. Measles, mumps, and rubella (MMR) vaccine  You may need at least one dose of MMR. You may also need a second dose. Meningococcal conjugate (MenACWY) vaccine  One dose is recommended if you are 18-50 years old and a Market researcher living in a residence hall, or if you have one of several medical conditions. You may also need additional booster doses. Pneumococcal conjugate (PCV13) vaccine  You may need this if you have certain conditions and were not previously vaccinated. Pneumococcal polysaccharide (PPSV23) vaccine  You may need one or two doses if you smoke cigarettes or if you have certain conditions. Hepatitis A vaccine  You may need this if you have certain conditions or if you travel or work in places where you may be exposed to hepatitis A. Hepatitis B vaccine  You may need this if you have certain conditions or if you travel or work in places where you may be exposed to hepatitis B. Haemophilus influenzae type b (Hib) vaccine  You may need this  if you have certain risk factors. You may receive vaccines as individual doses or as more than one vaccine together in one shot (combination vaccines). Talk with your health care provider about the risks and benefits of combination vaccines. What tests do I need? Blood tests  Lipid and cholesterol levels. These may be checked every 5 years starting at age 37.  Hepatitis C test.  Hepatitis B test. Screening   Diabetes screening. This is done by checking your blood sugar (glucose)  after you have not eaten for a while (fasting).  Sexually transmitted disease (STD) testing. Talk with your health care provider about your test results, treatment options, and if necessary, the need for more tests. Follow these instructions at home: Eating and drinking   Eat a diet that includes fresh fruits and vegetables, whole grains, lean protein, and low-fat dairy products.  Take vitamin and mineral supplements as recommended by your health care provider.  Do not drink alcohol if your health care provider tells you not to drink.  If you drink alcohol: ? Limit how much you have to 0-2 drinks a day. ? Be aware of how much alcohol is in your drink. In the [place] one drink equals one 12 oz bottle of beer (355 mL), one 5 oz glass of wine (148 mL), or one 1 oz glass of hard liquor (44 mL). Lifestyle  Take daily care of your teeth and gums.  Stay active. Exercise for at least 30 minutes on 5 or more days each week.  Do not use any products that contain nicotine or tobacco, such as cigarettes, e-cigarettes, and chewing tobacco. If you need help quitting, ask your health care provider.  If you are sexually active, practice safe sex. Use a condom or other form of protection to prevent STIs (sexually transmitted infections). What's next?  Go to your health care provider once a year for a well check visit.  Ask your health care provider how often you should have your eyes and teeth checked.  Stay up to date on all vaccines. This information is not intended to replace advice given to you by your health care provider. Make sure you discuss any questions you have with your health care provider. Document Revised: 02/02/2018 Document Reviewed: 02/02/2018 Elsevier Patient Education  2020 [employer].

## 2019-07-10 NOTE — Progress Notes (Signed)
Chief Complaint:  Johnny [name] is a 39 y.o. male who presents today for his annual comprehensive physical exam.    Assessment/Plan:  New/Acute Problems: Picker's nodules No red flags.  Will start topical betamethasone.  Also recommended topical capsaicin.  Discussed reasons to return to care.  Consider skin biopsy if not improving.  May need intralesional steroid injection.  Chronic Problems Addressed Today: Low back pain Continue management per sports med.  Vitamin D deficiency Check vitamin D level.  Continue supplementation.  Prediabetes Check A1c, CBC, C met, TSH.  GERD (gastroesophageal reflux disease) Stable.  Continue Dexilant per GI.  Dyslipidemia Having mild muscle cramps.  Will decrease Lipitor to 10 mg daily.  Check lipid panel.  Continue last modifications.  Benign essential hypertension At goal.  Continue amlodipine 5 mg daily.   Body mass index is 30.19 kg/m. / Obese BMI Metric Follow Up - 07/10/19 1408      BMI Metric Follow Up-Please document annually   BMI Metric Follow Up  Education provided        Preventative Healthcare: Check CBC, C met, TSH, lipid panel.  Is already received Covid vaccine.  Patient Counseling(The following topics were reviewed and/or handout was given):  -Nutrition: Stressed importance of moderation in sodium/caffeine intake, saturated fat and cholesterol, caloric balance, sufficient intake of fresh fruits, vegetables, and fiber.  -Stressed the importance of regular exercise.   -Substance Abuse: Discussed cessation/primary prevention of tobacco, alcohol, or other drug use; driving or other dangerous activities under the influence; availability of treatment for abuse.   -Injury prevention: Discussed safety belts, safety helmets, smoke detector, smoking near bedding or upholstery.   -Sexuality: Discussed sexually transmitted diseases, partner selection, use of condoms, avoidance of unintended pregnancy and contraceptive  alternatives.   -Dental health: Discussed importance of regular tooth brushing, flossing, and dental visits.  -Health maintenance and immunizations reviewed. Please refer to Health maintenance section.  Return to care in 1 year for next preventative visit.     Subjective:  HPI:  He has no acute complaints today.   He has a few irritated areas on his right arm.  Has tried using triamcinolone with no improvement.  Area is very itchy and he consistently picks at the area.  Lifestyle Diet: Balanced. Plenty of fruits and vegetables. Spicy foods helps cal done Crohns.  Exercise: Limited due to back pain.    Depression screen PHQ 2/9 11/29/2017  Decreased Interest 0  Down, Depressed, Hopeless 0  PHQ - 2 Score 0    ROS: Per HPI, otherwise a complete review of systems was negative.   PMH:  The following were reviewed and entered/updated in epic: Past Medical History:  Diagnosis Date  . Crohn's disease ([place])   . GERD (gastroesophageal reflux disease)   . Hyperlipidemia   . Hypertension    Patient Active Problem List   Diagnosis Date Noted  . Nonallopathic lesion of sacral region 08/24/2018  . Nonallopathic lesion of lumbosacral region 08/24/2018  . Nonallopathic lesion of thoracic region 08/24/2018  . Low back pain 07/27/2018  . Vitamin D deficiency 11/29/2017  . Prediabetes 12/24/2016  . Tinnitus 12/24/2016  . Crohn's disease ([place]) 06/11/2016  . Eczema 06/11/2016  . Fatty liver 06/11/2016  . GERD (gastroesophageal reflux disease) 06/11/2016  . IBS (irritable bowel syndrome) 06/11/2016  . Vitamin B12 deficiency 06/11/2016  . Dyslipidemia 05/25/2014  . Benign essential hypertension 10/01/2013  . Metabolic syndrome 45/04/8880   Past Surgical History:  Procedure Laterality Date  .  LIVER BIOPSY      History reviewed. No pertinent family history.  Medications- reviewed and updated Current Outpatient Medications  Medication Sig Dispense Refill  . amLODipine (NORVASC)  5 MG tablet Take 1 tablet (5 mg total) by mouth daily. Patient needs a follow up appt 90 tablet 3  . atorvastatin (LIPITOR) 10 MG tablet Take 1 tablet (10 mg total) by mouth daily at 6 PM. 90 tablet 3  . Cholecalciferol (VITAMIN D3) 2000 units TABS Take by mouth.    . dexlansoprazole (DEXILANT) 60 MG capsule Take by mouth.    . gabapentin (NEURONTIN) 400 MG capsule Take 1 capsule (400 mg total) by mouth at bedtime. 90 capsule 1  . mesalamine (LIALDA) 1.2 g EC tablet Take by mouth.    . triamcinolone cream (KENALOG) 0.5 % APPLY TO AFFECTED AREA TWICE A DAY 30 g 1  . Vitamin D, Ergocalciferol, (DRISDOL) 1.25 MG (50000 UNIT) CAPS capsule TAKE 1 CAPSULE (50,000 UNITS TOTAL) BY MOUTH EVERY 7 (SEVEN) DAYS. 12 capsule 0  . betamethasone valerate ointment (VALISONE) 0.1 % Apply 1 application topically 2 (two) times daily. 30 g 0   No current facility-administered medications for this visit.    Allergies-reviewed and updated Allergies  Allergen Reactions  . Codeine Nausea Only  . Nsaids   . Other     Champagne causes vomiting, diarrhea, and nausea.    Social History   Socioeconomic History  . Marital status: Married    Spouse name: Not on file  . Number of children: Not on file  . Years of education: Not on file  . Highest education level: Not on file  Occupational History  . Not on file  Tobacco Use  . Smoking status: Never Smoker  . Smokeless tobacco: Never Used  Substance and Sexual Activity  . Alcohol use: No  . Drug use: No  . Sexual activity: Yes  Other Topics Concern  . Not on file  Social History Narrative  . Not on file   Social Determinants of Health   Financial Resource Strain:   . Difficulty of Paying Living Expenses:   Food Insecurity:   . Worried About Charity fundraiser in the Last Year:   . Arboriculturist in the Last Year:   Transportation Needs:   . Film/video editor (Medical):   [name] Kitchen Lack of Transportation (Non-Medical):   Physical Activity:   .  Days of Exercise per Week:   . Minutes of Exercise per Session:   Stress:   . Feeling of Stress :   Social Connections:   . Frequency of Communication with Friends and Family:   . Frequency of Social Gatherings with Friends and Family:   . Attends Religious Services:   . Active Member of Clubs or Organizations:   . Attends Archivist Meetings:   [name] Kitchen Marital Status:         Objective:  Physical Exam: BP 140/80 (BP Location: Left Arm, Patient Position: Sitting, Cuff Size: Normal)   Pulse 87   Temp 98.2 F (36.8 C) (Temporal)   Ht 6' 4"  (1.93 m)   Wt 248 lb (112.5 kg)   SpO2 98%   BMI 30.19 kg/m   Body mass index is 30.19 kg/m. Wt Readings from Last 3 Encounters:  07/10/19 248 lb (112.5 kg)  06/21/19 246 lb (111.6 kg)  04/25/19 246 lb (111.6 kg)   Gen: NAD, resting comfortably HEENT: TMs normal bilaterally. OP clear. No thyromegaly noted.  CV:  RRR with no murmurs appreciated Pulm: NWOB, CTAB with no crackles, wheezes, or rhonchi GI: Normal bowel sounds present. Soft, Nontender, Nondistended. MSK: no edema, cyanosis, or clubbing noted Skin: warm, dry.  Excoriated hyperpigmented areas on right arm. Neuro: CN2-12 grossly intact. Strength 5/5 in upper and lower extremities. Reflexes symmetric and intact bilaterally.  Psych: Normal affect and thought content     Caleb M. Jerline Pain, MD 07/10/2019 2:08 PM

## 2019-07-10 NOTE — Assessment & Plan Note (Signed)
Continue management per sports med.

## 2019-07-11 LAB — VITAMIN D 25 HYDROXY (VIT D DEFICIENCY, FRACTURES): VITD: 33.26 ng/mL (ref 30.00–100.00)

## 2019-07-11 LAB — TSH: TSH: 2.14 u[IU]/mL (ref 0.35–4.50)

## 2019-07-12 NOTE — Progress Notes (Signed)
Please inform patient of the following:  Cholesterol and blood sugar are both elevated, but STABLE. Do not need to start meds but he should continue to work on diet and exercise and we can recheck in a year. Everything else is NORMAL.   Johnny [name]. Jerline Pain, MD 07/12/2019 9:12 AM

## 2019-07-19 ENCOUNTER — Ambulatory Visit
Admission: RE | Admit: 2019-07-19 | Discharge: 2019-07-19 | Disposition: A | Discharge status: Home / Self Care | Payer: PRIVATE HEALTH INSURANCE | Source: Ambulatory Visit | Attending: Family Medicine | Admitting: Family Medicine

## 2019-07-19 ENCOUNTER — Other Ambulatory Visit: Payer: Self-pay

## 2019-07-19 DIAGNOSIS — M545 Low back pain, unspecified: Secondary | ICD-10-CM

## 2019-07-19 IMAGING — MR MR LUMBAR SPINE W/O CM
4 of 5 series · 24 of 48 positions shown · non-contrast
Comparison: None.

CLINICAL DATA: Low back pain extending into both legs

EXAM:
MRI LUMBAR SPINE WITHOUT CONTRAST
TECHNIQUE: Multiplanar, multisequence MR imaging of the lumbar spine was
performed. No intravenous contrast was administered.

[Series 5: T2 · sagittal · 4.0mm · 0.73mm/px · 6 of 13 slices shown (1 of 2)]
[im 1/13]
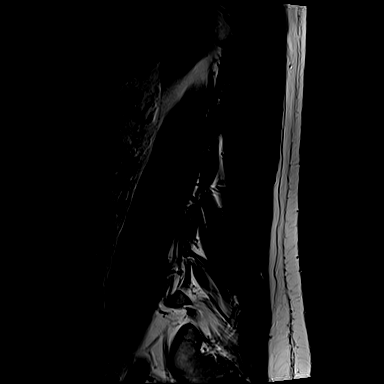
[im 3/13]
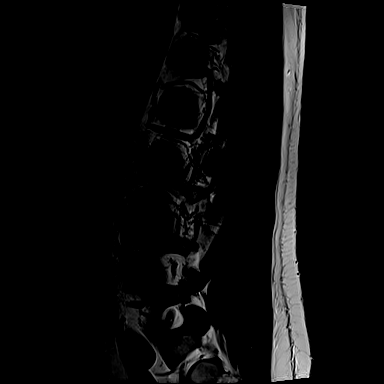
[im 5/13]
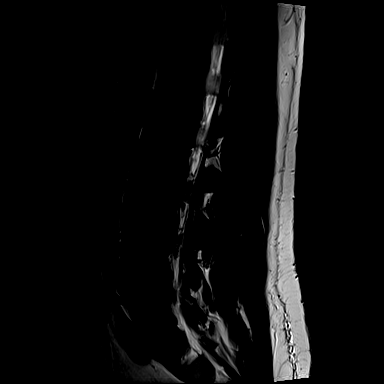
[im 8/13]
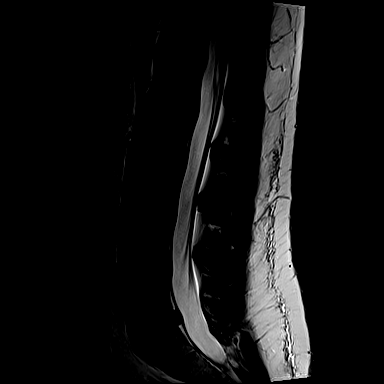
[im 10/13]
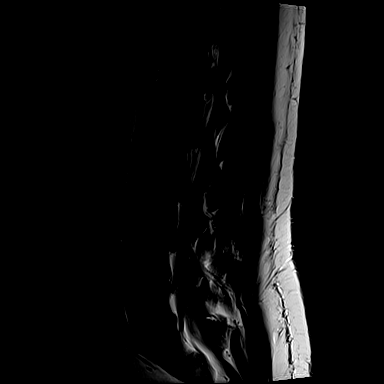
[im 13/13]
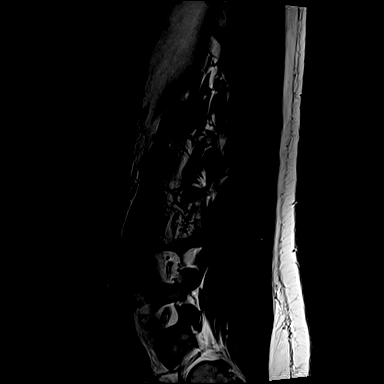

[Series 6: T1 · sagittal · 4.0mm · 0.73mm/px · 5 of 13 slices shown (1 of 2)]
[im 1/13]
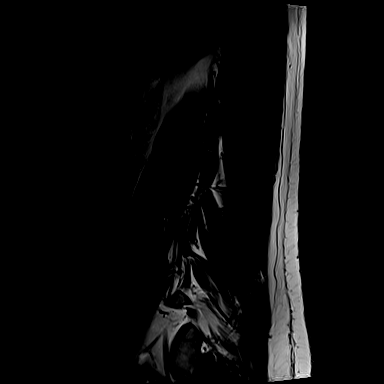
[im 4/13]
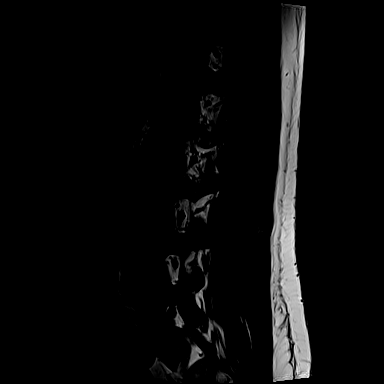
[im 7/13]
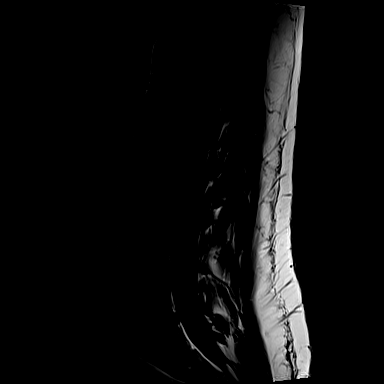
[im 10/13]
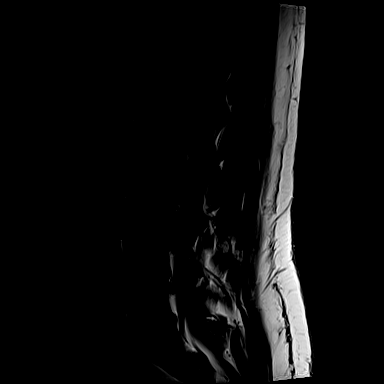
[im 13/13]
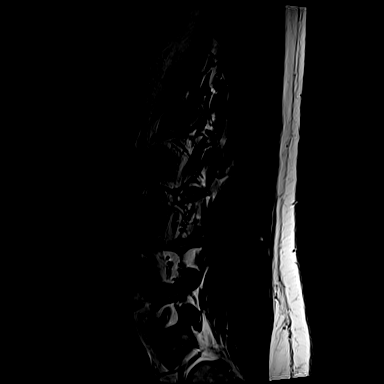

[Series 10: T2 · axial · 4.0mm · 0.35mm/px · z∈[-104,+117]mm · 10 of 41 slices shown (2 of 2)]
[im 3/41]
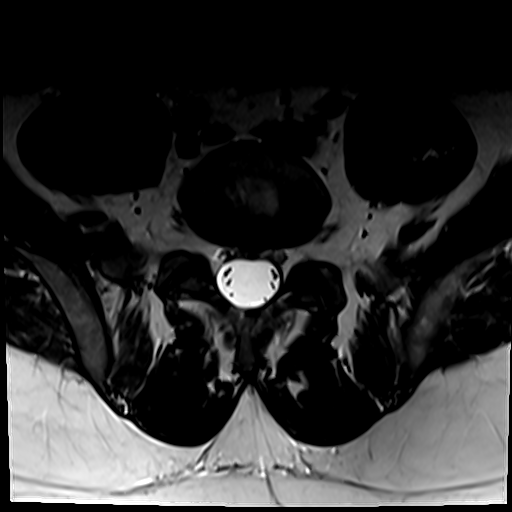
[im 6/41]
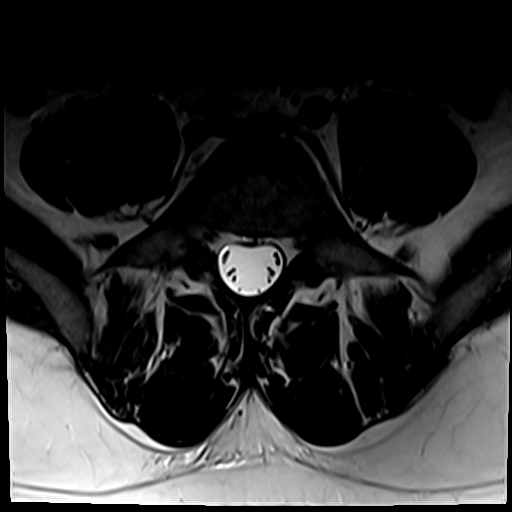
[im 9/41]
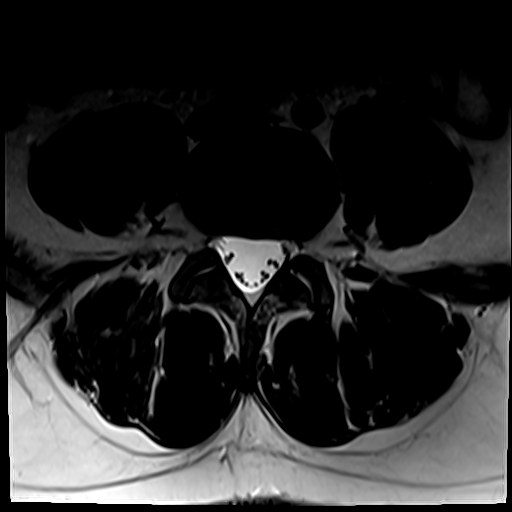
[im 14/41]
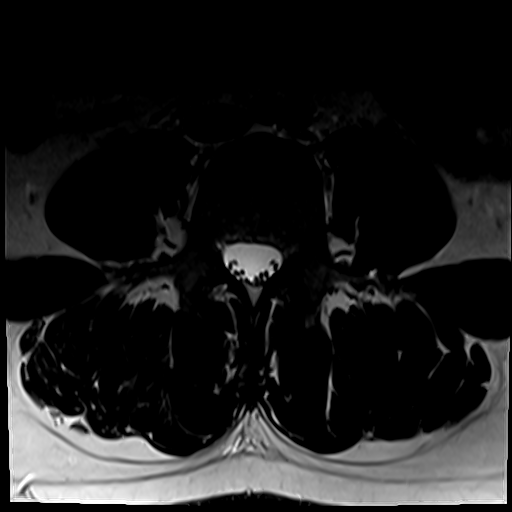
[im 19/41]
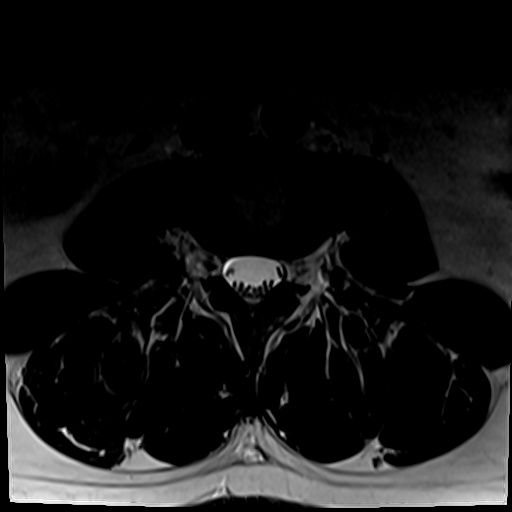
[im 22/41]
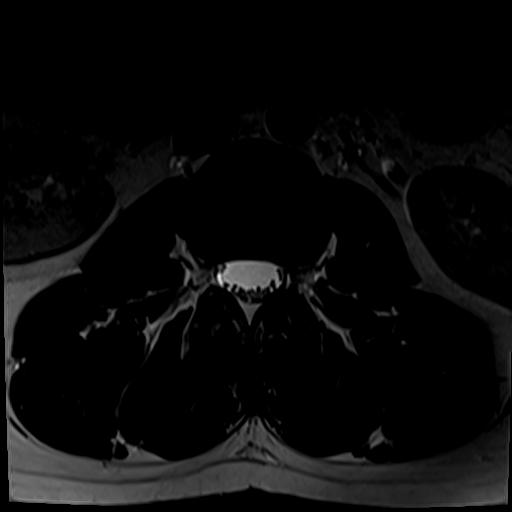
[im 25/41]
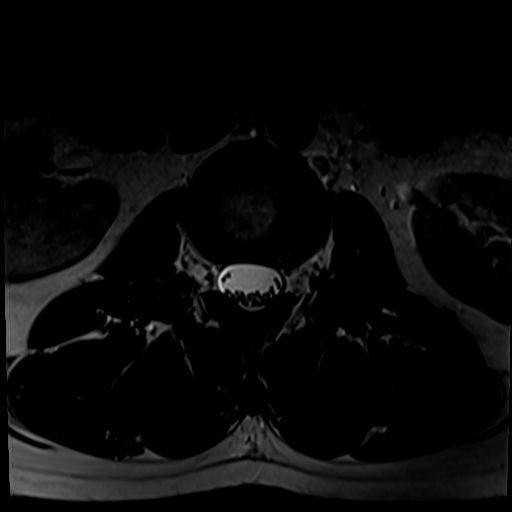
[im 30/41]
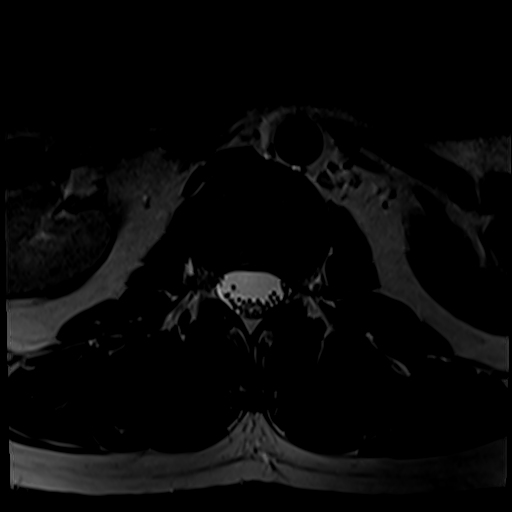
[im 35/41]
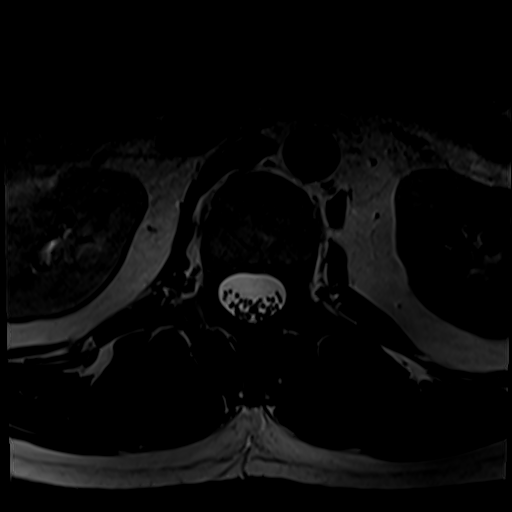
[im 41/41]
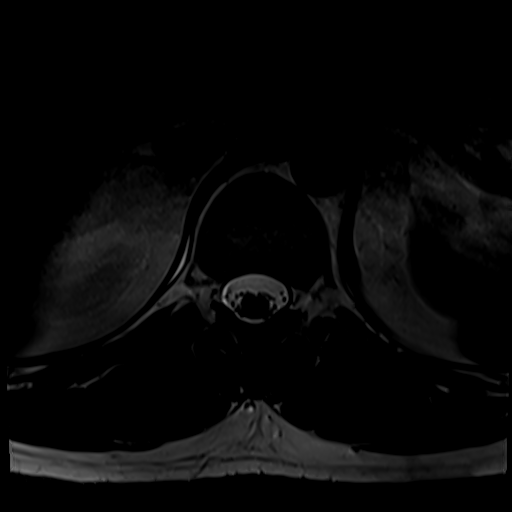

[Series 13: T1 · axial · 4.0mm · 0.35mm/px · z∈[-90,+87]mm · 3 of 41 slices shown (2 of 2)]
[im 6/41]
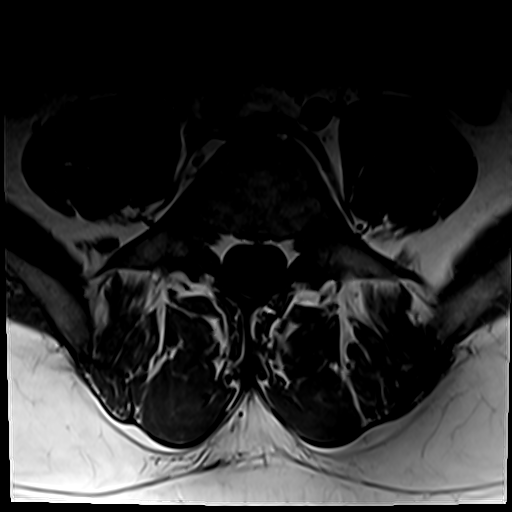
[im 22/41]
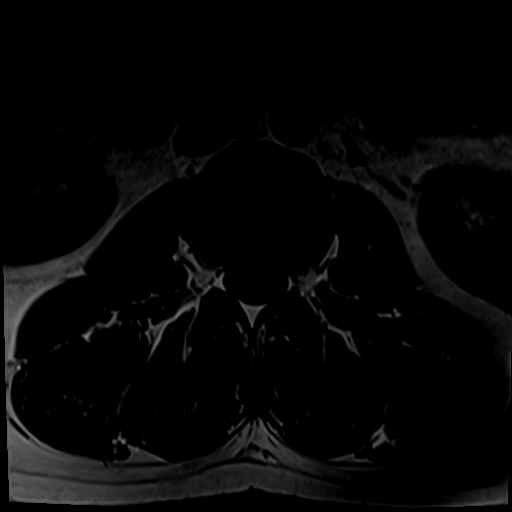
[im 35/41]
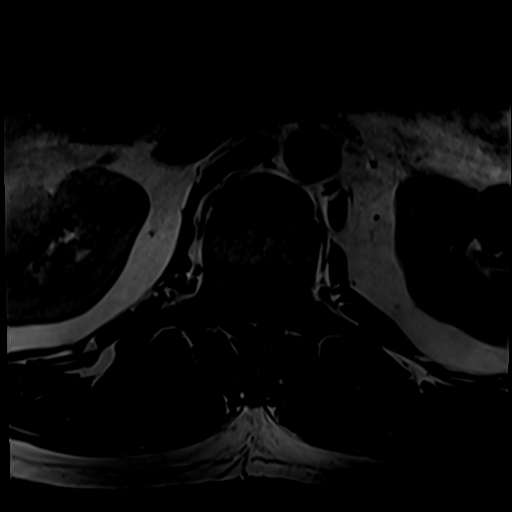

[24 of 48 positions shown; findings below may reference images not displayed]

FINDINGS: Segmentation:  Standard.

Alignment:  Preserved.

Vertebrae: Vertebral body heights are maintained. There is no
significant marrow edema or suspicious osseous lesion.

Conus medullaris and cauda equina: Conus extends to the inferior L1
level. Conus and cauda equina appear normal.

Paraspinal and other soft tissues: Unremarkable.

Disc levels: Intervertebral disc heights and signal are maintained.
At L5-S1, there is a small central disc protrusion without stenosis.
There is no disc herniation or stenosis at any other level.
IMPRESSION: Small central disc protrusion at L5-S1 without stenosis. Otherwise
unremarkable.

## 2019-07-26 ENCOUNTER — Ambulatory Visit (INDEPENDENT_AMBULATORY_CARE_PROVIDER_SITE_OTHER): Payer: PRIVATE HEALTH INSURANCE | Admitting: Family Medicine

## 2019-07-26 ENCOUNTER — Other Ambulatory Visit: Payer: Self-pay

## 2019-07-26 VITALS — BP 118/64 | HR 82 | Ht 76.0 in | Wt 247.0 lb

## 2019-07-26 DIAGNOSIS — G8929 Other chronic pain: Secondary | ICD-10-CM | POA: Diagnosis not present

## 2019-07-26 DIAGNOSIS — M999 Biomechanical lesion, unspecified: Secondary | ICD-10-CM

## 2019-07-26 DIAGNOSIS — M545 Low back pain: Secondary | ICD-10-CM | POA: Diagnosis not present

## 2019-07-26 MED ORDER — PREDNISONE 50 MG PO TABS: ORAL_TABLET | ORAL | 0 refills | Status: DC

## 2019-07-26 MED ORDER — KETOROLAC TROMETHAMINE 60 MG/2ML IM SOLN
60.0000 mg | Freq: Once | INTRAMUSCULAR | Status: AC
  Administered 2019-07-26: 60 mg via INTRAMUSCULAR

## 2019-07-26 MED ORDER — METHYLPREDNISOLONE ACETATE 80 MG/ML IJ SUSP
80.0000 mg | Freq: Once | INTRAMUSCULAR | Status: AC
  Administered 2019-07-26: 80 mg via INTRAMUSCULAR

## 2019-07-26 NOTE — Assessment & Plan Note (Signed)
Patient's MRI did show the patient did have a protruding disc at L5-S1.  Otherwise I do not see anything that is truly surgical.  Patient will undergo an epidural.  Toradol and Depo-Medrol given secondary to the amount of pain patient is stating that she has at the moment.  Discussed which activities to doing which wants to potentially avoid.  Patient is to increase activity slowly over the course the next several weeks.  Follow-up with me again in 4 to 8 weeks

## 2019-07-26 NOTE — Progress Notes (Signed)
[facility] [place] Phone: [phone] Subjective:   Johnny [name], am serving as a scribe for Dr. Hulan Saas. This visit occurred during the SARS-CoV-2 public health emergency.  Safety protocols were in place, including screening questions prior to the visit, additional usage of staff PPE, and extensive cleaning of exam room while observing appropriate contact time as indicated for disinfecting solutions.   I'm seeing this patient by the request  of:  Vivi Barrack, MD  CC: Low back pain follow-up  [id number]  Johnny [name] is a 39 y.o. male coming in with complaint of back and neck pain. OMT 06/21/2019. Patient states that he continues to have pain in lower back. Patient has been using gabapentin. Only has pain relief when he is resting on a heating pad. Patient states that it is chronic pain.  Symptoms keeps him from playing with his kids.  Patient was sent for an MRI.  MRI independently visualized by me showing the patient did have a small protruding disc at the L5-S1 but otherwise fairly unremarkable.         Reviewed prior external information including notes and imaging from previsou exam, outside providers and external EMR if available.   As well as notes that were available from care everywhere and other healthcare systems.  Past medical history, social, surgical and family history all reviewed in electronic medical record.  [name] information unless stated regarding to the chief complaint.   Past Medical History:  Diagnosis Date  . Crohn's disease ([place])   . GERD (gastroesophageal reflux disease)   . Hyperlipidemia   . Hypertension     Allergies  Allergen Reactions  . Codeine Nausea Only  . Nsaids   . Other     Champagne causes vomiting, diarrhea, and nausea.     Review of Systems:  [name], visual changes, nausea, vomiting, diarrhea, constipation, dizziness, abdominal pain, skin  rash, fevers, chills, night sweats, weight loss, swollen lymph nodes, body aches, joint swelling, chest pain, shortness of breath, mood changes. POSITIVE muscle aches  Objective  Blood pressure 118/64, pulse 82, height 6' 4"  (1.93 m), weight 247 lb (112 kg), SpO2 97 %.   General: [name] apparent distress alert and oriented x3 mood and affect normal, dressed appropriately.  HEENT: Pupils equal, extraocular movements intact  Respiratory: Patient's speak in full sentences and does not appear short of breath  Cardiovascular: [name] lower extremity edema, non tender, [name] erythema  Neuro: Cranial nerves II through XII are intact, neurovascularly intact in all extremities with 2+ DTRs and 2+ pulses.  Gait normal with good balance and coordination.  MSK:  Non tender with full range of motion and good stability and symmetric strength and tone of shoulders, elbows, wrist, hip, knee and ankles bilaterally.  Back - Normal skin, continues to have pain out of proportion to the amount of palpation. Tightness with straight leg test but [name] true radicular symptoms.  Worsening pain with full extension as well as full flexion.   Osteopathic findings  T4 extended rotated and side bent left L2 flexed rotated and side bent right Sacrum right on right       Assessment and Plan:  Low back pain Patient's MRI did show the patient did have a protruding disc at L5-S1.  Otherwise I do not see anything that is truly surgical.  Patient will undergo an epidural.  Toradol and Depo-Medrol given secondary to the amount of pain patient  is stating that she has at the moment.  Discussed which activities to doing which wants to potentially avoid.  Patient is to increase activity slowly over the course the next several weeks.  Follow-up with me again in 4 to 8 weeks  Nonallopathic lesion of lumbosacral region   Decision today to treat with OMT was based on Physical Exam  After verbal consent patient was treated with HVLA, ME, FPR  techniques in  thoracic, lumbar and sacral areas, all areas are chronic   Patient tolerated the procedure well with improvement in symptoms  Patient given exercises, stretches and lifestyle modifications  See medications in patient instructions if given  Patient will follow up in 4-8 weeks      The above documentation has been reviewed and is accurate and complete [name], DO       Note: This dictation was prepared with Dragon dictation along with smaller phrase technology. Any transcriptional errors that result from this process are unintentional.

## 2019-07-26 NOTE — Patient Instructions (Addendum)
Epidural L5/S1 [id number] Prednisone  See me in 3 weeks after epidural

## 2019-07-26 NOTE — Assessment & Plan Note (Addendum)

## 2019-07-27 ENCOUNTER — Encounter: Payer: Self-pay | Admitting: Family Medicine

## 2019-08-09 ENCOUNTER — Ambulatory Visit
Admission: RE | Admit: 2019-08-09 | Discharge: 2019-08-09 | Disposition: A | Discharge status: Home / Self Care | Payer: PRIVATE HEALTH INSURANCE | Source: Ambulatory Visit | Attending: Family Medicine | Admitting: Family Medicine

## 2019-08-09 DIAGNOSIS — M545 Low back pain, unspecified: Secondary | ICD-10-CM

## 2019-08-09 IMAGING — XA Imaging study
2 series · 2 of 2 positions shown · non-contrast
Comparison: none

CLINICAL DATA: Lumbosacral spondylosis without myelopathy. Mid
lumbar back pain radiating into the left greater than right lateral
thigh. Small central disc protrusion at L5-S1.

[Series 1: ortho adipose · 1 of 1 slices shown (1 of 2)]
[im 1/1]
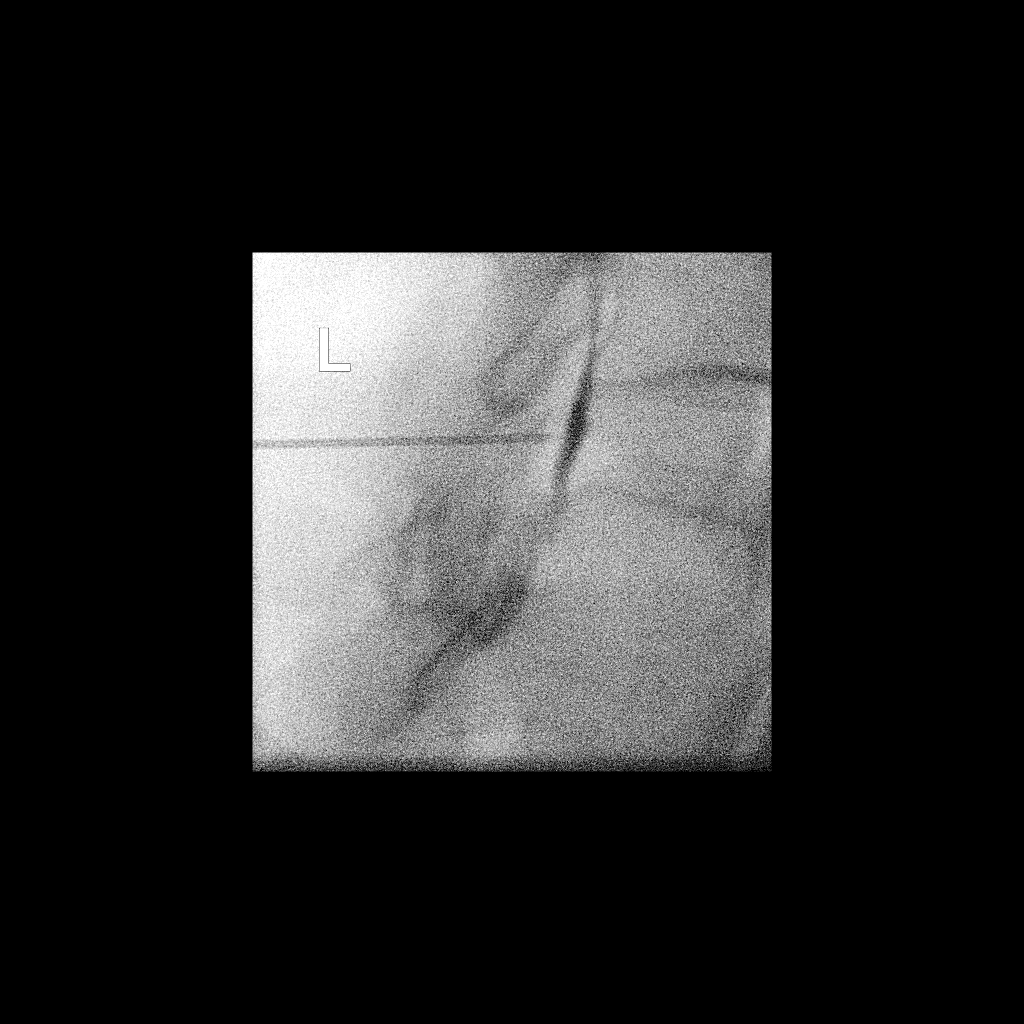

[Series 2: ortho adipose · 1 of 1 slices shown (2 of 2)]
[im 1/1]
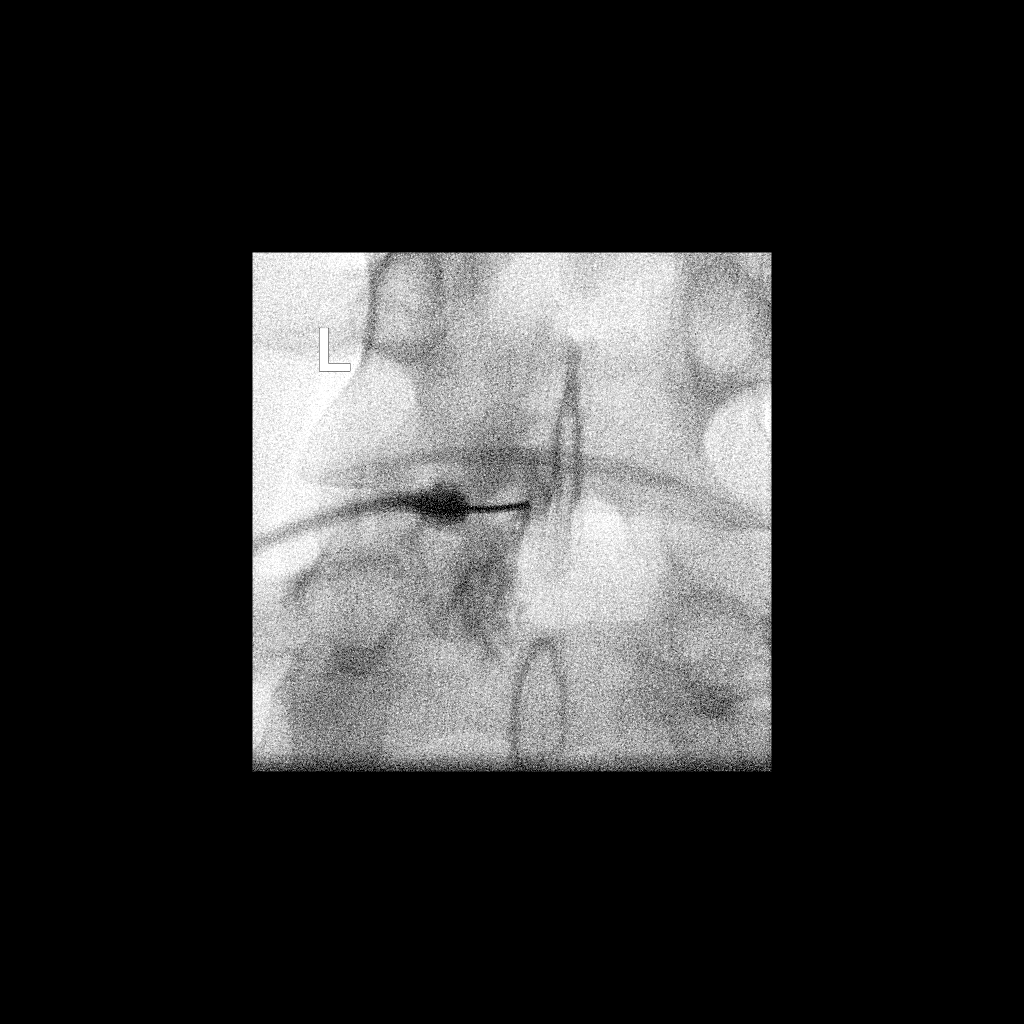

[2 of 2 positions shown; findings below may reference images not displayed]

FLUOROSCOPY TIME:  Radiation Exposure Index (as provided by the
fluoroscopic device): 2.6 mGy

Fluoroscopy Time:  17 seconds

Number of Acquired Images:  0

PROCEDURE:
The procedure, risks, benefits, and alternatives were explained to
the patient. Questions regarding the procedure were encouraged and
answered. The patient understands and consents to the procedure.

LUMBAR EPIDURAL INJECTION:

Due to lack of significant posterior epidural fat at L5-S1, an
interlaminar approach was performed on the left at L4-L5. The
overlying skin was cleansed and anesthetized. A 3.5 inch 20 gauge
epidural needle was advanced using loss-of-resistance technique.

DIAGNOSTIC EPIDURAL INJECTION:

Initial injection demonstrated posterior paraspinous spread. The
needle was further advanced until a second loss of resistance was
felt. Repeat contrast injection of Isovue-M 200 shows a good
epidural pattern with spread above and below the level of needle
placement, primarily on the left. No vascular opacification is seen.

THERAPEUTIC EPIDURAL INJECTION:

120 mg of Depo-Medrol mixed with 3 mL of 1% lidocaine were
instilled. The procedure was well-tolerated, and the patient was
discharged thirty minutes following the injection in good condition.

COMPLICATIONS:
None immediate.
IMPRESSION: Technically successful interlaminar epidural injection on the left
at L4-L5.

## 2019-08-09 MED ORDER — METHYLPREDNISOLONE ACETATE 40 MG/ML INJ SUSP (RADIOLOG
120.0000 mg | Freq: Once | INTRAMUSCULAR | Status: AC
  Administered 2019-08-09: 120 mg via EPIDURAL

## 2019-08-09 MED ORDER — IOPAMIDOL (ISOVUE-M 200) INJECTION 41%
1.0000 mL | Freq: Once | INTRAMUSCULAR | Status: AC
  Administered 2019-08-09: 1 mL via EPIDURAL

## 2019-08-09 NOTE — Discharge Instructions (Signed)

## 2019-08-28 ENCOUNTER — Ambulatory Visit (INDEPENDENT_AMBULATORY_CARE_PROVIDER_SITE_OTHER): Payer: PRIVATE HEALTH INSURANCE | Admitting: Family Medicine

## 2019-08-28 ENCOUNTER — Encounter: Payer: Self-pay | Admitting: Family Medicine

## 2019-08-28 ENCOUNTER — Other Ambulatory Visit: Payer: Self-pay

## 2019-08-28 VITALS — BP 120/80 | HR 79 | Ht 76.0 in | Wt 241.0 lb

## 2019-08-28 DIAGNOSIS — G8929 Other chronic pain: Secondary | ICD-10-CM | POA: Diagnosis not present

## 2019-08-28 DIAGNOSIS — M999 Biomechanical lesion, unspecified: Secondary | ICD-10-CM

## 2019-08-28 DIAGNOSIS — M545 Low back pain: Secondary | ICD-10-CM | POA: Diagnosis not present

## 2019-08-28 NOTE — Progress Notes (Signed)
[facility] [address] [place] Phone: [phone] Subjective:   I [name] am serving as a Education administrator for Dr. Hulan Saas.  This visit occurred during the SARS-CoV-2 public health emergency.  Safety protocols were in place, including screening questions prior to the visit, additional usage of staff PPE, and extensive cleaning of exam room while observing appropriate contact time as indicated for disinfecting solutions.   I'm seeing this patient by the request  of:  Vivi Barrack, MD  CC: Back pain follow-up  [id number]  Johnny [name] is a 39 y.o. male coming in with complaint of back and neck pain. OMT 07/26/2019. Patient states he is about 20% better. Has good and bad days.  Patient states that maybe not as severe pain as what he was having previously.  Still having pain on a daily basis.  Patient states when he did have the epidural when he initially started having the injection did hit the exact spot where the pain seems to originate.  Maybe had as much of 80% improvement in the first initial hours and then once again has been only 20% since then  Medications patient has been prescribed: Gabapentin which patient has discontinued also discontinued statin       MRI of the back does show a small central disc protrusion at L5-S1 without stenosis.  Patient undergone an epidural August 09, 2019   Reviewed prior external information including notes and imaging from previsou exam, outside providers and external EMR if available.   As well as notes that were available from care everywhere and other healthcare systems.  Past medical history, social, surgical and family history all reviewed in electronic medical record.  No pertanent information unless stated regarding to the chief complaint.   Past Medical History:  Diagnosis Date  . Crohn's disease ([place])   . GERD (gastroesophageal reflux disease)   . Hyperlipidemia   .  Hypertension     Allergies  Allergen Reactions  . Nsaids   . Other     Champagne causes vomiting, diarrhea, and nausea.  . Codeine Nausea Only     Review of Systems:  No headache, visual changes, nausea, vomiting, diarrhea, constipation, dizziness, abdominal pain, skin rash, fevers, chills, night sweats, weight loss, swollen lymph nodes, body aches, joint swelling, chest pain, shortness of breath, mood changes. POSITIVE muscle aches  Objective  Blood pressure 120/80, pulse 79, height 6' 4"  (1.93 m), weight 241 lb (109.3 kg), SpO2 97 %.   General: No apparent distress alert and oriented x3 mood and affect normal, dressed appropriately.  HEENT: Pupils equal, extraocular movements intact  Respiratory: Patient's speak in full sentences and does not appear short of breath  Cardiovascular: No lower extremity edema, non tender, no erythema  Neuro: Cranial nerves II through XII are intact, neurovascularly intact in all extremities with 2+ DTRs and 2+ pulses.  Gait normal with good balance and coordination.  MSK:  Non tender with full range of motion and good stability and symmetric strength and tone of shoulders, elbows, wrist, hip, knee and ankles bilaterally.  Back -  Osteopathic findings   T8 extended rotated and side bent left L2 flexed rotated and side bent right Sacrum right on right       Assessment and Plan: Low back pain-patient was found to have more of a protruding disc at L5-S1 with a 20% improvement with the epidural.  Could potentially respond to another 1.  We discussed formal  physical therapy which I think patient will be really well suited for.  Now the patient has had decreasing amount of pain is decreasing the gabapentin at this point.  Did discuss if necessary we can go back to the 100 mg tabs.  Patient will increase activity slowly.  Follow-up again in 4 to 8 weeks   Nonallopathic problems  Decision today to treat with OMT was based on Physical Exam  After  verbal consent patient was treated with HVLA, ME, FPR techniques in thoracic, lumbar, and sacral  areas  Patient tolerated the procedure well with improvement in symptoms  Patient given exercises, stretches and lifestyle modifications  See medications in patient instructions if given  Patient will follow up in 4-8 weeks      The above documentation has been reviewed and is accurate and complete [name], DO       Note: This dictation was prepared with Dragon dictation along with smaller phrase technology. Any transcriptional errors that result from this process are unintentional.

## 2019-08-28 NOTE — Patient Instructions (Addendum)
Keep being active Play with gabapentin We can always repeat epidural See me again in 6-8 weeks

## 2019-09-06 ENCOUNTER — Other Ambulatory Visit: Payer: Self-pay | Admitting: Family Medicine

## 2019-09-20 ENCOUNTER — Encounter: Payer: Self-pay | Admitting: Physical Therapy

## 2019-09-20 ENCOUNTER — Other Ambulatory Visit: Payer: Self-pay

## 2019-09-20 ENCOUNTER — Ambulatory Visit: Payer: PRIVATE HEALTH INSURANCE | Admitting: Physical Therapy

## 2019-09-20 DIAGNOSIS — G8929 Other chronic pain: Secondary | ICD-10-CM | POA: Diagnosis not present

## 2019-09-20 DIAGNOSIS — M5442 Lumbago with sciatica, left side: Secondary | ICD-10-CM

## 2019-09-24 ENCOUNTER — Encounter: Payer: Self-pay | Admitting: Physical Therapy

## 2019-09-24 NOTE — Therapy (Signed)
[facility] [address] [place], [place], [address] Phone: [phone]   Fax:  [phone]  Physical Therapy Evaluation  Patient Details  Name: Johnny [name] MRN: [id number] Date of Birth: 06-21-1980 Referring Provider (PT): Dimas Chyle   Encounter Date: 09/20/2019   PT End of Session - 09/24/19 1338    Visit Number 1    Number of Visits 12    Date for PT Re-Evaluation 11/01/19    Authorization Type [employer]    PT Start Time 1303    PT Stop Time 1345    PT Time Calculation (min) 42 min    Activity Tolerance Patient tolerated treatment well    Behavior During Therapy WFL for tasks assessed/performed           Past Medical History:  Diagnosis Date   Crohn's disease ([place])    GERD (gastroesophageal reflux disease)    Hyperlipidemia    Hypertension     Past Surgical History:  Procedure Laterality Date   LIVER BIOPSY      There were no vitals filed for this visit.    Subjective Assessment - 09/24/19 1331    Subjective Pt states increased pain in March 2020. Slipped and fell, onto bottom and SI region. Pain is better than initially, but has not ever fully improved. Most pain with bending. Did have radicular pain/numbness in L thigh. Pt works Chief Executive Officer, Teaching laboratory technician work, sitting, has stand up desk but hasnt used yet.    Pertinent History Crohns    Patient Stated Goals Decreased back pain    Currently in Pain? Yes    Pain Score 5     Pain Location Back    Pain Orientation Right;Left    Pain Descriptors / Indicators Aching    Pain Type Acute pain    Pain Radiating Towards L thigh    Pain Onset More than a month ago    Pain Frequency Intermittent    Aggravating Factors  bending , lifting, sitting              [facility] PT Assessment - 09/24/19 0001      Assessment   Medical Diagnosis Low back pain    Referring Provider (PT) Dimas Chyle    Prior Therapy no      Balance Screen   Has the patient fallen in the  past 6 months No      Prior Function   Level of Independence Independent      Cognition   Overall Cognitive Status Within Functional Limits for tasks assessed      ROM / Strength   AROM / PROM / Strength AROM;Strength      AROM   Overall AROM Comments HIps: [facility]    AROM Assessment Site Lumbar    Lumbar Flexion mod deficit/pain    Lumbar Extension mild deficit    Lumbar - Right Side Bend wfl    Lumbar - Left Side Bend wfl      Strength   Overall Strength Comments hips: 4/5, core: 3/5       Palpation   Palpation comment Pain at L low lumbar musculature, L SI, mild in L glute, and pain in central lumbar (high) lumbar region       Special Tests   Other special tests Neg SLR but reports radicular pain with sitting                       Objective measurements completed on  examination: See above findings.       [facility] Adult PT Treatment/Exercise - 09/24/19 0001      Exercises   Exercises Lumbar      Lumbar Exercises: Stretches   Piriformis Stretch 2 reps;30 seconds    Piriformis Stretch Limitations seated      Lumbar Exercises: Standing   Other Standing Lumbar Exercises EIS x 20;       Lumbar Exercises: Prone   Other Prone Lumbar Exercises Prone on elbows x 3 min;  Prone press ups ( to hands) x 20 ;                  PT Education - 09/24/19 1337    Education Details PT POC, Exam findings, HEP (prone progression)    Person(s) Educated Patient    Methods Explanation;Demonstration;Tactile cues;Verbal cues;Handout    Comprehension Verbalized understanding;Returned demonstration;Tactile cues required;Verbal cues required;Need further instruction            PT Short Term Goals - 09/24/19 1340      PT SHORT TERM GOAL #1   Title Pt to be independent with initial HEP    Time 2    Period Weeks    Status New    Target Date 10/04/19      PT SHORT TERM GOAL #2   Title Pt to report standing for work duties, at least 50% of the time.    Time 6     Period Weeks    Status New    Target Date 10/04/19             PT Long Term Goals - 09/24/19 1341      PT LONG TERM GOAL #1   Title Pt to be independent with final HEP    Time 6    Period Weeks    Status New    Target Date 11/01/19      PT LONG TERM GOAL #2   Title Pt to report decreased pain in low back and L LE to 0-2/10 with sitting, work duties, and bending.    Time 6    Period Weeks    Status New    Target Date 11/01/19      PT LONG TERM GOAL #3   Title Pt to demo proper mechanics for bend/lift/squat technique    Time 6    Period Weeks    Status New    Target Date 11/01/19                  Plan - 09/24/19 1344    Clinical Impression Statement Pt presents with primary complaint of increased pain in low back. Also has pain into L thigh at times/previously. Pt with increased pain with bending, squatting, and sitting. He has lack of effective HEP for pain relief and his dx. Pt with decreased ability for full functional activities and work duties, due to pain and deficits. Pt to benefit from skilled PT to improve.    Personal Factors and Comorbidities Time since onset of injury/illness/exacerbation;Profession    Examination-Activity Limitations Locomotion Level;Bend;Sit;Squat;Lift;Stand    Examination-Participation Restrictions Cleaning;Occupation;Driving;Laundry;Yard Work;Community Activity    Stability/Clinical Decision Making Stable/Uncomplicated    Clinical Decision Making Low    Rehab Potential Good    PT Frequency 2x / week    PT Duration 6 weeks    PT Treatment/Interventions ADLs/Self Care Home Management;Cryotherapy;Electrical Stimulation;Iontophoresis 31m/ml Dexamethasone;Moist Heat;Ultrasound;Traction;Therapeutic exercise;Therapeutic activities;Functional mobility training;Stair training;Gait training;Patient/family education;Dry needling;Passive range of motion;Taping;Vasopneumatic Device;Spinal Manipulations;Joint Manipulations;Manual techniques  Consulted and Agree with Plan of Care Patient           Patient will benefit from skilled therapeutic intervention in order to improve the following deficits and impairments:  Decreased range of motion, Increased muscle spasms, Decreased activity tolerance, Pain, Impaired flexibility, Improper body mechanics, Decreased mobility, Decreased strength  Visit Diagnosis: Chronic bilateral low back pain with left-sided sciatica     Problem List Patient Active Problem List   Diagnosis Date Noted   Nonallopathic lesion of sacral region 08/24/2018   Nonallopathic lesion of lumbosacral region 08/24/2018   Nonallopathic lesion of thoracic region 08/24/2018   Low back pain 07/27/2018   Vitamin D deficiency 11/29/2017   Prediabetes 12/24/2016   Tinnitus 12/24/2016   Crohn's disease ([place]) 06/11/2016   Eczema 06/11/2016   Fatty liver 06/11/2016   GERD (gastroesophageal reflux disease) 06/11/2016   IBS (irritable bowel syndrome) 06/11/2016   Vitamin B12 deficiency 06/11/2016   Dyslipidemia 05/25/2014   Benign essential hypertension 36/01/2448   Metabolic syndrome 75/30/0511    Lyndee Hensen, PT, DPT 1:56 PM  09/24/19    [facility] [address] [place], [place], [address] Phone: [phone]   Fax:  [phone]  Name: Johnny [name] MRN: [id number] Date of Birth: 12/19/1980

## 2019-10-02 ENCOUNTER — Encounter: Payer: Self-pay | Admitting: Family Medicine

## 2019-10-02 ENCOUNTER — Other Ambulatory Visit: Payer: Self-pay

## 2019-10-02 ENCOUNTER — Ambulatory Visit (INDEPENDENT_AMBULATORY_CARE_PROVIDER_SITE_OTHER): Payer: PRIVATE HEALTH INSURANCE | Admitting: Physical Therapy

## 2019-10-02 ENCOUNTER — Encounter: Payer: Self-pay | Admitting: Physical Therapy

## 2019-10-02 DIAGNOSIS — G8929 Other chronic pain: Secondary | ICD-10-CM

## 2019-10-02 DIAGNOSIS — M5442 Lumbago with sciatica, left side: Secondary | ICD-10-CM | POA: Diagnosis not present

## 2019-10-02 NOTE — Patient Instructions (Signed)
Access Code: [email or web address] URL: [email or web address] Date: 10/02/2019 Prepared by: Lyndee Hensen  Exercises Prone Press Up on Elbows - 3 x daily - 2 sets - 10 reps Prone Press Up - 3 x daily - 2 sets - 10 reps Supine Posterior Pelvic Tilt - 2 x daily - 2 sets - 10 reps Standing Lumbar Extension - 4 x daily - 2 sets - 10 reps Cat Cow - 2 x daily - 2 sets - 10 reps Supine Lower Trunk Rotation - 2 x daily - 10 reps - 10 hold Supine Bridge - 1 x daily - 2 sets - 10 reps Child's Pose Stretch - 2 x daily - 3 reps - 30 hold

## 2019-10-03 ENCOUNTER — Encounter: Payer: Self-pay | Admitting: Physical Therapy

## 2019-10-03 NOTE — Therapy (Signed)
[name] [address] [place], [place], [address] Phone: [phone]   Fax:  [phone]  Physical Therapy Treatment  Patient Details  Name: Johnny [name] MRN: [id number] Date of Birth: [birth year] Referring Provider (PT): Dimas Chyle   Encounter Date: 10/02/2019   PT End of Session - 10/02/19 1308    Visit Number 2    Number of Visits 12    Date for PT Re-Evaluation 11/01/19    Authorization Type [employer]    PT Start Time 1303    PT Stop Time 1345    PT Time Calculation (min) 42 min    Activity Tolerance Patient tolerated treatment well    Behavior During Therapy [facility] for tasks assessed/performed           Past Medical History:  Diagnosis Date  . Crohn's disease ([place])   . GERD (gastroesophageal reflux disease)   . Hyperlipidemia   . Hypertension     Past Surgical History:  Procedure Laterality Date  . LIVER BIOPSY      There were no vitals filed for this visit.   Subjective Assessment - 10/02/19 1307    Subjective Pt rerports less pain down leg. He went on vacation, and got a massage, he said was helpful. Has been doing HEP.    Currently in Pain? Yes    Pain Score 4     Pain Location Back    Pain Orientation Right;Left    Pain Descriptors / Indicators Aching    Pain Type Acute pain    Pain Onset More than a month ago                             [facility] Adult PT Treatment/Exercise - 10/02/19 1310      Exercises   Exercises Lumbar      Lumbar Exercises: Stretches   Lower Trunk Rotation 5 reps;10 seconds    Piriformis Stretch 2 reps;30 seconds    Piriformis Stretch Limitations seated      Lumbar Exercises: Aerobic   Recumbent Bike L2 x 8 min       Lumbar Exercises: Standing   Functional Squats 10 reps    Functional Squats Limitations with education on form     Other Standing Lumbar Exercises EIS x 20;       Lumbar Exercises: Seated   Sit to Stand 10 reps      Lumbar Exercises:  Supine   Clam 20 reps    Bent Knee Raise 20 reps    Bridge 20 reps      Lumbar Exercises: Prone   Other Prone Lumbar Exercises Prone on elbows x 3 min;  Prone press ups ( to hands) x 20 ;      Lumbar Exercises: Quadruped   Madcat/Old Horse 20 reps      Manual Therapy   Manual Therapy Joint mobilization;Soft tissue mobilization    Joint Mobilization PA mobs thoracic and lumbar spine    Soft tissue mobilization DTM to bil low thoracic and high lumbar paraspinals                     PT Short Term Goals - 09/24/19 1340      PT SHORT TERM GOAL #1   Title Pt to be independent with initial HEP    Time 2    Period Weeks    Status New    Target Date 10/04/19  PT SHORT TERM GOAL #2   Title Pt to report standing for work duties, at least 50% of the time.    Time 6    Period Weeks    Status New    Target Date 10/04/19             PT Long Term Goals - 09/24/19 1341      PT LONG TERM GOAL #1   Title Pt to be independent with final HEP    Time 6    Period Weeks    Status New    Target Date 11/01/19      PT LONG TERM GOAL #2   Title Pt to report decreased pain in low back and L LE to 0-2/10 with sitting, work duties, and bending.    Time 6    Period Weeks    Status New    Target Date 11/01/19      PT LONG TERM GOAL #3   Title Pt to demo proper mechanics for bend/lift/squat technique    Time 6    Period Weeks    Status New    Target Date 11/01/19                 Plan - 10/03/19 1447    Clinical Impression Statement Pt with decreased symptoms in LE.  Proper mechanics for lumbar extension reviewed today. Ther ex progressed for core strength. Lifting mechanics reviewed, will benefit from continued education on this. Plan to progress as tolerated .    Personal Factors and Comorbidities Time since onset of injury/illness/exacerbation;Profession    Examination-Activity Limitations Locomotion Level;Bend;Sit;Squat;Lift;Stand    Examination-Participation  Restrictions Cleaning;Occupation;Driving;Laundry;Yard Work;Community Activity    Stability/Clinical Decision Making Stable/Uncomplicated    Rehab Potential Good    PT Frequency 2x / week    PT Duration 6 weeks    PT Treatment/Interventions ADLs/Self Care Home Management;Cryotherapy;Electrical Stimulation;Iontophoresis 28m/ml Dexamethasone;Moist Heat;Ultrasound;Traction;Therapeutic exercise;Therapeutic activities;Functional mobility training;Stair training;Gait training;Patient/family education;Dry needling;Passive range of motion;Taping;Vasopneumatic Device;Spinal Manipulations;Joint Manipulations;Manual techniques    Consulted and Agree with Plan of Care Patient           Patient will benefit from skilled therapeutic intervention in order to improve the following deficits and impairments:  Decreased range of motion, Increased muscle spasms, Decreased activity tolerance, Pain, Impaired flexibility, Improper body mechanics, Decreased mobility, Decreased strength  Visit Diagnosis: Chronic bilateral low back pain with left-sided sciatica     Problem List Patient Active Problem List   Diagnosis Date Noted  . Nonallopathic lesion of sacral region 08/24/2018  . Nonallopathic lesion of lumbosacral region 08/24/2018  . Nonallopathic lesion of thoracic region 08/24/2018  . Low back pain 07/27/2018  . Vitamin D deficiency 11/29/2017  . Prediabetes 12/24/2016  . Tinnitus 12/24/2016  . Crohn's disease ([place] 06/11/2016  . Eczema 06/11/2016  . Fatty liver 06/11/2016  . GERD (gastroesophageal reflux disease) 06/11/2016  . IBS (irritable bowel syndrome) 06/11/2016  . Vitamin B12 deficiency 06/11/2016  . Dyslipidemia 05/25/2014  . Benign essential hypertension 10/01/2013  . Metabolic syndrome 130/08/6224   LLyndee [name] PT, DPT 2:48 PM  10/03/19    [facility] [place] [address]Phone: [phone]  Fax:   [phone] Name: Johnny MAIOLOMRN: [id number] of Birth: [birth year]

## 2019-10-04 ENCOUNTER — Encounter: Payer: Self-pay | Admitting: Physical Therapy

## 2019-10-04 ENCOUNTER — Other Ambulatory Visit: Payer: Self-pay

## 2019-10-04 ENCOUNTER — Ambulatory Visit: Payer: PRIVATE HEALTH INSURANCE | Admitting: Physical Therapy

## 2019-10-04 DIAGNOSIS — G8929 Other chronic pain: Secondary | ICD-10-CM

## 2019-10-04 DIAGNOSIS — M5442 Lumbago with sciatica, left side: Secondary | ICD-10-CM | POA: Diagnosis not present

## 2019-10-04 NOTE — Therapy (Signed)
[facility] [address] [place], [place], [address] Phone: [phone]   Fax:  [phone]  Physical Therapy Treatment  Patient Details  Name: Johnny [name] MRN: [id number] Date of Birth: 1980/05/29 Referring Provider (PT): Dimas Chyle   Encounter Date: 10/04/2019   PT End of Session - 10/04/19 1314    Visit Number 3    Number of Visits 12    Date for PT Re-Evaluation 11/01/19    Authorization Type [employer]    PT Start Time 4008    PT Stop Time 1348    PT Time Calculation (min) 43 min    Activity Tolerance Patient tolerated treatment well    Behavior During Therapy [facility] for tasks assessed/performed           Past Medical History:  Diagnosis Date  . Crohn's disease ([place])   . GERD (gastroesophageal reflux disease)   . Hyperlipidemia   . Hypertension     Past Surgical History:  Procedure Laterality Date  . LIVER BIOPSY      There were no vitals filed for this visit.   Subjective Assessment - 10/04/19 1313    Subjective Pt states soreness in central/mid back today.    Currently in Pain? Yes    Pain Score 4     Pain Location Back    Pain Orientation Right;Left    Pain Descriptors / Indicators Aching    Pain Type Acute pain    Pain Onset More than a month ago    Pain Frequency Intermittent                             [facility] Adult PT Treatment/Exercise - 10/04/19 1316      Exercises   Exercises Lumbar      Lumbar Exercises: Stretches   Active Hamstring Stretch 3 reps;30 seconds    Active Hamstring Stretch Limitations seated    Lower Trunk Rotation 5 reps;10 seconds    Piriformis Stretch 2 reps;30 seconds    Piriformis Stretch Limitations seated      Lumbar Exercises: Aerobic   Recumbent Bike L2 x 8 min       Lumbar Exercises: Standing   Functional Squats --    Functional Squats Limitations --    Other Standing Lumbar Exercises --      Lumbar Exercises: Seated   Sit to Stand --       Lumbar Exercises: Supine   Clam 20 reps    Clam Limitations GTB    Bent Knee Raise 20 reps    Bent Knee Raise Limitations with GTB    Bridge 20 reps      Lumbar Exercises: Prone   Other Prone Lumbar Exercises  Prone press ups ( to hands) x 25 ;      Lumbar Exercises: Quadruped   Madcat/Old Horse 20 reps      Manual Therapy   Manual Therapy Joint mobilization;Soft tissue mobilization    Manual therapy comments skilled palpation and monitoring of soft tissue with dry needling     Joint Mobilization PA mobs thoracic and lumbar spine    Soft tissue mobilization --            Trigger Point Dry Needling - 10/04/19 0001    Consent Given? Yes    Education Handout Provided Yes    Muscles Treated Back/Hip Thoracic multifidi    Thoracic multifidi response Twitch response elicited;Palpable increased muscle length  L and R                  PT Short Term Goals - 09/24/19 1340      PT SHORT TERM GOAL #1   Title Pt to be independent with initial HEP    Time 2    Period Weeks    Status New    Target Date 10/04/19      PT SHORT TERM GOAL #2   Title Pt to report standing for work duties, at least 50% of the time.    Time 6    Period Weeks    Status New    Target Date 10/04/19             PT Long Term Goals - 09/24/19 1341      PT LONG TERM GOAL #1   Title Pt to be independent with final HEP    Time 6    Period Weeks    Status New    Target Date 11/01/19      PT LONG TERM GOAL #2   Title Pt to report decreased pain in low back and L LE to 0-2/10 with sitting, work duties, and bending.    Time 6    Period Weeks    Status New    Target Date 11/01/19      PT LONG TERM GOAL #3   Title Pt to demo proper mechanics for bend/lift/squat technique    Time 6    Period Weeks    Status New    Target Date 11/01/19                 Plan - 10/04/19 1401    Clinical Impression Statement Pt with continued soreness in mid back, low thoracic region. THer ex  progressed for mobility and strengthening, DN added for pain and muscle tightness, will assess effects next visit.    Personal Factors and Comorbidities Time since onset of injury/illness/exacerbation;Profession    Examination-Activity Limitations Locomotion Level;Bend;Sit;Squat;Lift;Stand    Examination-Participation Restrictions Cleaning;Occupation;Driving;Laundry;Yard Work;Community Activity    Stability/Clinical Decision Making Stable/Uncomplicated    Rehab Potential Good    PT Frequency 2x / week    PT Duration 6 weeks    PT Treatment/Interventions ADLs/Self Care Home Management;Cryotherapy;Electrical Stimulation;Iontophoresis 36m/ml Dexamethasone;Moist Heat;Ultrasound;Traction;Therapeutic exercise;Therapeutic activities;Functional mobility training;Stair training;Gait training;Patient/family education;Dry needling;Passive range of motion;Taping;Vasopneumatic Device;Spinal Manipulations;Joint Manipulations;Manual techniques    Consulted and Agree with Plan of Care Patient           Patient will benefit from skilled therapeutic intervention in order to improve the following deficits and impairments:  Decreased range of motion, Increased muscle spasms, Decreased activity tolerance, Pain, Impaired flexibility, Improper body mechanics, Decreased mobility, Decreased strength  Visit Diagnosis: Chronic bilateral low back pain with left-sided sciatica     Problem List Patient Active Problem List   Diagnosis Date Noted  . Nonallopathic lesion of sacral region 08/24/2018  . Nonallopathic lesion of lumbosacral region 08/24/2018  . Nonallopathic lesion of thoracic region 08/24/2018  . Low back pain 07/27/2018  . Vitamin D deficiency 11/29/2017  . Prediabetes 12/24/2016  . Tinnitus 12/24/2016  . [place]'s disease ([place] 06/11/2016  . Eczema 06/11/2016  . Fatty liver 06/11/2016  . GERD (gastroesophageal reflux disease) 06/11/2016  . IBS (irritable bowel syndrome) 06/11/2016  . Vitamin B12  deficiency 06/11/2016  . Dyslipidemia 05/25/2014  . Benign essential hypertension 10/01/2013  . Metabolic syndrome 106/30/1601   LLyndee Hensen PT, DPT 2:02 PM  10/04/19  [facility] [address] [place], [place], [address] Phone: [phone]   Fax:  [phone]  Name: Johnny [name] MRN: [id number] Date of Birth: [birth year]

## 2019-10-09 ENCOUNTER — Other Ambulatory Visit: Payer: Self-pay

## 2019-10-09 ENCOUNTER — Encounter: Payer: Self-pay | Admitting: Physical Therapy

## 2019-10-09 ENCOUNTER — Ambulatory Visit: Payer: PRIVATE HEALTH INSURANCE | Admitting: Physical Therapy

## 2019-10-09 DIAGNOSIS — G8929 Other chronic pain: Secondary | ICD-10-CM

## 2019-10-09 DIAGNOSIS — M5442 Lumbago with sciatica, left side: Secondary | ICD-10-CM

## 2019-10-09 NOTE — Therapy (Signed)
[facility] [address] [place], [place], [address] Phone: [phone]   Fax:  [phone]  Physical Therapy Treatment  Patient Details  Name: Johnny [name] MRN: [id number] Date of Birth: [birth year] Referring Provider (PT): Dimas Chyle   Encounter Date: 10/09/2019   PT End of Session - 10/09/19 1314    Visit Number 4    Number of Visits 12    Date for PT Re-Evaluation 11/01/19    Authorization Type [employer]    PT Start Time 1305    PT Stop Time 1345    PT Time Calculation (min) 40 min    Activity Tolerance Patient tolerated treatment well    Behavior During Therapy [facility] for tasks assessed/performed           Past Medical History:  Diagnosis Date   Crohn's disease ([place])    GERD (gastroesophageal reflux disease)    Hyperlipidemia    Hypertension     Past Surgical History:  Procedure Laterality Date   LIVER BIOPSY      There were no vitals filed for this visit.   Subjective Assessment - 10/09/19 1312    Subjective Pt with slight increased soreness today, slight pain in LE in am, but has gone away. Leg pain overall better.    Currently in Pain? Yes    Pain Score 5     Pain Location Back    Pain Orientation Right;Left    Pain Descriptors / Indicators Aching    Pain Type Acute pain    Pain Onset More than a month ago    Pain Frequency Intermittent                             [facility] Adult PT Treatment/Exercise - 10/09/19 1311      Exercises   Exercises Lumbar      Lumbar Exercises: Stretches   Active Hamstring Stretch 3 reps;30 seconds    Active Hamstring Stretch Limitations seated    Lower Trunk Rotation --    Piriformis Stretch 2 reps;30 seconds    Piriformis Stretch Limitations seated      Lumbar Exercises: Aerobic   Recumbent Bike L2 x 8 min       Lumbar Exercises: Supine   Clam 20 reps    Clam Limitations GTB    Bent Knee Raise 20 reps    Bent Knee Raise Limitations with GTB     Bridge 20 reps      Lumbar Exercises: Sidelying   Hip Abduction 20 reps      Lumbar Exercises: Prone   Other Prone Lumbar Exercises  Prone press ups ( to hands) x 25 ;    Other Prone Lumbar Exercises prone hip ext x 20;       Lumbar Exercises: Quadruped   Madcat/Old Horse --      Manual Therapy   Manual Therapy Joint mobilization;Soft tissue mobilization    Manual therapy comments --    Joint Mobilization PA mobs thoracic and lumbar spine    Soft tissue mobilization DTM and IASTM to Bil thoracic and lumbar paraspinals.                   PT Education - 10/09/19 1314    Education Details Reviewed HEP    Person(s) Educated Patient    Methods Explanation;Demonstration;Verbal cues;Handout    Comprehension Verbalized understanding;Returned demonstration;Verbal cues required;Need further instruction  PT Short Term Goals - 09/24/19 1340      PT SHORT TERM GOAL #1   Title Pt to be independent with initial HEP    Time 2    Period Weeks    Status New    Target Date 10/04/19      PT SHORT TERM GOAL #2   Title Pt to report standing for work duties, at least 50% of the time.    Time 6    Period Weeks    Status New    Target Date 10/04/19             PT Long Term Goals - 09/24/19 1341      PT LONG TERM GOAL #1   Title Pt to be independent with final HEP    Time 6    Period Weeks    Status New    Target Date 11/01/19      PT LONG TERM GOAL #2   Title Pt to report decreased pain in low back and L LE to 0-2/10 with sitting, work duties, and bending.    Time 6    Period Weeks    Status New    Target Date 11/01/19      PT LONG TERM GOAL #3   Title Pt to demo proper mechanics for bend/lift/squat technique    Time 6    Period Weeks    Status New    Target Date 11/01/19                 Plan - 10/09/19 1449    Clinical Impression Statement Pt with increased soreness today, but does have less tenderness in spine wiht PAs and with DTM to  thoracic and lumbar musculature. THer ex progressed for strengthening and stabilization, HEP updated. Plan to progress as tolerated.    Personal Factors and Comorbidities Time since onset of injury/illness/exacerbation;Profession    Examination-Activity Limitations Locomotion Level;Bend;Sit;Squat;Lift;Stand    Examination-Participation Restrictions Cleaning;Occupation;Driving;Laundry;Yard Work;Community Activity    Stability/Clinical Decision Making Stable/Uncomplicated    Rehab Potential Good    PT Frequency 2x / week    PT Duration 6 weeks    PT Treatment/Interventions ADLs/Self Care Home Management;Cryotherapy;Electrical Stimulation;Iontophoresis 64m/ml Dexamethasone;Moist Heat;Ultrasound;Traction;Therapeutic exercise;Therapeutic activities;Functional mobility training;Stair training;Gait training;Patient/family education;Dry needling;Passive range of motion;Taping;Vasopneumatic Device;Spinal Manipulations;Joint Manipulations;Manual techniques    Consulted and Agree with Plan of Care Patient           Patient will benefit from skilled therapeutic intervention in order to improve the following deficits and impairments:  Decreased range of motion, Increased muscle spasms, Decreased activity tolerance, Pain, Impaired flexibility, Improper body mechanics, Decreased mobility, Decreased strength  Visit Diagnosis: Chronic bilateral low back pain with left-sided sciatica     Problem List Patient Active Problem List   Diagnosis Date Noted   Nonallopathic lesion of sacral region 08/24/2018   Nonallopathic lesion of lumbosacral region 08/24/2018   Nonallopathic lesion of thoracic region 08/24/2018   Low back pain 07/27/2018   Vitamin D deficiency 11/29/2017   Prediabetes 12/24/2016   Tinnitus 12/24/2016   Crohn's disease ([place] 06/11/2016   Eczema 06/11/2016   Fatty liver 06/11/2016   GERD (gastroesophageal reflux disease) 06/11/2016   IBS (irritable bowel syndrome) 06/11/2016    Vitamin B12 deficiency 06/11/2016   Dyslipidemia 05/25/2014   Benign essential hypertension 025/36/6440  Metabolic syndrome 134/74/2595   LLyndee Hensen PT, DPT 2:50 PM  10/09/19    [facility] [address]  [place], [address] Phone: [phone]   Fax:  [phone]  Name: Johnny [name] MRN: [id number] Date of Birth: 18-Jan-1981

## 2019-10-16 ENCOUNTER — Ambulatory Visit (INDEPENDENT_AMBULATORY_CARE_PROVIDER_SITE_OTHER): Payer: PRIVATE HEALTH INSURANCE | Admitting: Physical Therapy

## 2019-10-16 ENCOUNTER — Other Ambulatory Visit: Payer: Self-pay

## 2019-10-16 ENCOUNTER — Encounter: Payer: Self-pay | Admitting: Physical Therapy

## 2019-10-16 DIAGNOSIS — M5442 Lumbago with sciatica, left side: Secondary | ICD-10-CM | POA: Diagnosis not present

## 2019-10-16 DIAGNOSIS — G8929 Other chronic pain: Secondary | ICD-10-CM

## 2019-10-18 ENCOUNTER — Other Ambulatory Visit: Payer: Self-pay

## 2019-10-18 ENCOUNTER — Ambulatory Visit: Payer: PRIVATE HEALTH INSURANCE | Admitting: Physical Therapy

## 2019-10-18 ENCOUNTER — Encounter: Payer: Self-pay | Admitting: Physical Therapy

## 2019-10-18 DIAGNOSIS — G8929 Other chronic pain: Secondary | ICD-10-CM | POA: Diagnosis not present

## 2019-10-18 DIAGNOSIS — M5442 Lumbago with sciatica, left side: Secondary | ICD-10-CM

## 2019-10-19 ENCOUNTER — Encounter: Payer: Self-pay | Admitting: Physical Therapy

## 2019-10-19 NOTE — Therapy (Signed)
[facility] [address] [place], [place], [address] Phone: [phone]   Fax:  [phone]  Physical Therapy Treatment  Patient Details  Name: Johnny [name] MRN: [id number] Date of Birth: 09-14-1980 Referring Provider (PT): Dimas Chyle   Encounter Date: 10/16/2019   PT End of Session - 10/19/19 2214    Visit Number 5    Number of Visits 12    Date for PT Re-Evaluation 11/01/19    Authorization Type [employer]    PT Start Time 9024    PT Stop Time 1345    PT Time Calculation (min) 40 min    Activity Tolerance Patient tolerated treatment well    Behavior During Therapy [facility] for tasks assessed/performed           Past Medical History:  Diagnosis Date  . Crohn's disease ([place])   . GERD (gastroesophageal reflux disease)   . Hyperlipidemia   . Hypertension     Past Surgical History:  Procedure Laterality Date  . LIVER BIOPSY      There were no vitals filed for this visit.   Subjective Assessment - 10/19/19 2213    Subjective Pt states variable soreness, feels "tight" with transitions. LE pain better.    Currently in Pain? Yes    Pain Score 4     Pain Location Back    Pain Orientation Right;Left    Pain Descriptors / Indicators Aching    Pain Type Acute pain    Pain Onset More than a month ago    Pain Frequency Intermittent                             [facility] Adult PT Treatment/Exercise - 10/19/19 0001      Exercises   Exercises Lumbar      Lumbar Exercises: Stretches   Active Hamstring Stretch 3 reps;30 seconds    Active Hamstring Stretch Limitations seated    Single Knee to Chest Stretch 3 reps;30 seconds    Piriformis Stretch 2 reps;30 seconds    Piriformis Stretch Limitations seated      Lumbar Exercises: Aerobic   Recumbent Bike L2 x 8 min       Lumbar Exercises: Standing   Row 20 reps    Theraband Level (Row) Level 4 (Blue)      Lumbar Exercises: Supine   Clam 20 reps    Clam  Limitations GTB    Bent Knee Raise 20 reps    Bent Knee Raise Limitations with GTB    Bridge 20 reps      Lumbar Exercises: Sidelying   Hip Abduction 20 reps      Lumbar Exercises: Prone   Other Prone Lumbar Exercises  Prone press ups ( to hands) x 25 ;    Other Prone Lumbar Exercises prone hip ext x 20; Prone T x 20;       Lumbar Exercises: Quadruped   Madcat/Old Horse 20 reps    Opposite Arm/Leg Raise 20 reps      Manual Therapy   Manual Therapy Joint mobilization;Soft tissue mobilization    Joint Mobilization PA mobs thoracic and lumbar spine    Soft tissue mobilization DTM and IASTM to Bil thoracic and lumbar paraspinals.                     PT Short Term Goals - 09/24/19 1340      PT SHORT TERM GOAL #  1   Title Pt to be independent with initial HEP    Time 2    Period Weeks    Status New    Target Date 10/04/19      PT SHORT TERM GOAL #2   Title Pt to report standing for work duties, at least 50% of the time.    Time 6    Period Weeks    Status New    Target Date 10/04/19             PT Long Term Goals - 09/24/19 1341      PT LONG TERM GOAL #1   Title Pt to be independent with final HEP    Time 6    Period Weeks    Status New    Target Date 11/01/19      PT LONG TERM GOAL #2   Title Pt to report decreased pain in low back and L LE to 0-2/10 with sitting, work duties, and bending.    Time 6    Period Weeks    Status New    Target Date 11/01/19      PT LONG TERM GOAL #3   Title Pt to demo proper mechanics for bend/lift/squat technique    Time 6    Period Weeks    Status New    Target Date 11/01/19                 Plan - 10/19/19 2215    Clinical Impression Statement Pt improving with LE pain, lumbar mobility and ability for ther ex. Continues to have bothersome pain in high lumbar region. Practice for lifting, bending mechanics today.    Personal Factors and Comorbidities Time since onset of  injury/illness/exacerbation;Profession    Examination-Activity Limitations Locomotion Level;Bend;Sit;Squat;Lift;Stand    Examination-Participation Restrictions Cleaning;Occupation;Driving;Laundry;Yard Work;Community Activity    Stability/Clinical Decision Making Stable/Uncomplicated    Rehab Potential Good    PT Frequency 2x / week    PT Duration 6 weeks    PT Treatment/Interventions ADLs/Self Care Home Management;Cryotherapy;Electrical Stimulation;Iontophoresis 79m/ml Dexamethasone;Moist Heat;Ultrasound;Traction;Therapeutic exercise;Therapeutic activities;Functional mobility training;Stair training;Gait training;Patient/family education;Dry needling;Passive range of motion;Taping;Vasopneumatic Device;Spinal Manipulations;Joint Manipulations;Manual techniques    Consulted and Agree with Plan of Care Patient           Patient will benefit from skilled therapeutic intervention in order to improve the following deficits and impairments:  Decreased range of motion, Increased muscle spasms, Decreased activity tolerance, Pain, Impaired flexibility, Improper body mechanics, Decreased mobility, Decreased strength  Visit Diagnosis: Chronic bilateral low back pain with left-sided sciatica     Problem List Patient Active Problem List   Diagnosis Date Noted  . Nonallopathic lesion of sacral region 08/24/2018  . Nonallopathic lesion of lumbosacral region 08/24/2018  . Nonallopathic lesion of thoracic region 08/24/2018  . Low back pain 07/27/2018  . Vitamin D deficiency 11/29/2017  . Prediabetes 12/24/2016  . Tinnitus 12/24/2016  . Crohn's disease ([place] 06/11/2016  . Eczema 06/11/2016  . Fatty liver 06/11/2016  . GERD (gastroesophageal reflux disease) 06/11/2016  . IBS (irritable bowel syndrome) 06/11/2016  . Vitamin B12 deficiency 06/11/2016  . Dyslipidemia 05/25/2014  . Benign essential hypertension 10/01/2013  . Metabolic syndrome 196/75/9163   LLyndee Hensen PT, DPT 10:18 PM   10/19/19    [address] [place] [address]Phone: [phone]  Fax:  [phone] Name: Johnny HOBBSMRN: [id number] of Birth: 520-Jul-1982

## 2019-10-19 NOTE — Therapy (Signed)
[facility] [address] [place], [place], [address] Phone: [phone]   Fax:  [phone]  Physical Therapy Treatment  Patient Details  Name: Johnny [name] MRN: [id number] Date of Birth: August 28, 1980 Referring Provider (PT): Dimas Chyle   Encounter Date: 10/18/2019   PT End of Session - 10/19/19 2220    Visit Number 6    Number of Visits 12    Date for PT Re-Evaluation 11/01/19    Authorization Type [employer]    PT Start Time 1304    PT Stop Time 1344    PT Time Calculation (min) 40 min    Activity Tolerance Patient tolerated treatment well    Behavior During Therapy [facility] for tasks assessed/performed           Past Medical History:  Diagnosis Date   Crohn's disease ([place])    GERD (gastroesophageal reflux disease)    Hyperlipidemia    Hypertension     Past Surgical History:  Procedure Laterality Date   LIVER BIOPSY      There were no vitals filed for this visit.   Subjective Assessment - 10/19/19 2218    Subjective Pt reports pain persists with bending, squatting, and giving kids bath. No LE pain    Currently in Pain? Yes    Pain Score 4     Pain Location Back    Pain Orientation Right;Left    Pain Descriptors / Indicators Aching    Pain Onset More than a month ago    Pain Frequency Intermittent                             [facility] Adult PT Treatment/Exercise - 10/19/19 2222      Exercises   Exercises Lumbar      Lumbar Exercises: Stretches   Active Hamstring Stretch 3 reps;30 seconds    Active Hamstring Stretch Limitations seated    Single Knee to Chest Stretch --    Piriformis Stretch 2 reps;30 seconds    Piriformis Stretch Limitations seated    Other Lumbar Stretch Exercise childs pose 30 sec l/r/center      Lumbar Exercises: Aerobic   Recumbent Bike L2 x 8 min       Lumbar Exercises: Standing   Row 20 reps    Theraband Level (Row) Level 4 (Blue)      Lumbar Exercises: Supine     Clam --    Clam Limitations --    Bent Knee Raise --    Bent Knee Raise Limitations --    Bridge 20 reps    Straight Leg Raise 10 reps    Other Supine Lumbar Exercises mod crunch x 20;       Lumbar Exercises: Sidelying   Hip Abduction 20 reps      Lumbar Exercises: Prone   Other Prone Lumbar Exercises  Prone press ups ( to hands) x 25 ;    Other Prone Lumbar Exercises --      Lumbar Exercises: Quadruped   Madcat/Old Horse 20 reps    Opposite Arm/Leg Raise 20 reps      Manual Therapy   Manual Therapy Joint mobilization;Soft tissue mobilization    Manual therapy comments skilled palpation and monitoring of soft tissue with dry needling     Joint Mobilization PA mobs thoracic and lumbar spine    Soft tissue mobilization DTM and IASTM to Bil thoracic and lumbar paraspinals.  Trigger Point Dry Needling - 10/19/19 0001    Consent Given? Yes    Education Handout Provided Previously provided    Muscles Treated Back/Hip Lumbar multifidi    Lumbar multifidi Response Palpable increased muscle length   L and R                 PT Short Term Goals - 09/24/19 1340      PT SHORT TERM GOAL #1   Title Pt to be independent with initial HEP    Time 2    Period Weeks    Status New    Target Date 10/04/19      PT SHORT TERM GOAL #2   Title Pt to report standing for work duties, at least 50% of the time.    Time 6    Period Weeks    Status New    Target Date 10/04/19             PT Long Term Goals - 09/24/19 1341      PT LONG TERM GOAL #1   Title Pt to be independent with final HEP    Time 6    Period Weeks    Status New    Target Date 11/01/19      PT LONG TERM GOAL #2   Title Pt to report decreased pain in low back and L LE to 0-2/10 with sitting, work duties, and bending.    Time 6    Period Weeks    Status New    Target Date 11/01/19      PT LONG TERM GOAL #3   Title Pt to demo proper mechanics for bend/lift/squat technique    Time 6     Period Weeks    Status New    Target Date 11/01/19                 Plan - 10/19/19 2221    Clinical Impression Statement Pt with soreness in bil lumbar paraspinals in low thoracic/high lumbar region. DN and IASTM done to address tightenss and pain. Pt doing well with strength and ther ex progressions. Continued to discuss bending/squatting posture.    Personal Factors and Comorbidities Time since onset of injury/illness/exacerbation;Profession    Examination-Activity Limitations Locomotion Level;Bend;Sit;Squat;Lift;Stand    Examination-Participation Restrictions Cleaning;Occupation;Driving;Laundry;Yard Work;Community Activity    Stability/Clinical Decision Making Stable/Uncomplicated    Rehab Potential Good    PT Frequency 2x / week    PT Duration 6 weeks    PT Treatment/Interventions ADLs/Self Care Home Management;Cryotherapy;Electrical Stimulation;Iontophoresis 82m/ml Dexamethasone;Moist Heat;Ultrasound;Traction;Therapeutic exercise;Therapeutic activities;Functional mobility training;Stair training;Gait training;Patient/family education;Dry needling;Passive range of motion;Taping;Vasopneumatic Device;Spinal Manipulations;Joint Manipulations;Manual techniques    Consulted and Agree with Plan of Care Patient           Patient will benefit from skilled therapeutic intervention in order to improve the following deficits and impairments:  Decreased range of motion, Increased muscle spasms, Decreased activity tolerance, Pain, Impaired flexibility, Improper body mechanics, Decreased mobility, Decreased strength  Visit Diagnosis: Chronic bilateral low back pain with left-sided sciatica     Problem List Patient Active Problem List   Diagnosis Date Noted   Nonallopathic lesion of sacral region 08/24/2018   Nonallopathic lesion of lumbosacral region 08/24/2018   Nonallopathic lesion of thoracic region 08/24/2018   Low back pain 07/27/2018   Vitamin D deficiency 11/29/2017    Prediabetes 12/24/2016   Tinnitus 12/24/2016   Crohn's disease ([place] 06/11/2016   Eczema 06/11/2016   Fatty liver 06/11/2016  GERD (gastroesophageal reflux disease) 06/11/2016   IBS (irritable bowel syndrome) 06/11/2016   Vitamin B12 deficiency 06/11/2016   Dyslipidemia 05/25/2014   Benign essential hypertension 90/68/9340   Metabolic syndrome 68/40/3353    Lyndee Hensen, PT, DPT 10:24 PM  10/19/19    [facility] [place], [place], [address] Phone: [phone]   Fax:  [phone]  Name: Johnny [name] MRN: [id number] Date of Birth: [birth year]

## 2019-10-23 ENCOUNTER — Other Ambulatory Visit: Payer: Self-pay

## 2019-10-23 ENCOUNTER — Encounter: Payer: Self-pay | Admitting: Physical Therapy

## 2019-10-23 ENCOUNTER — Ambulatory Visit: Payer: PRIVATE HEALTH INSURANCE | Admitting: Physical Therapy

## 2019-10-23 DIAGNOSIS — M5442 Lumbago with sciatica, left side: Secondary | ICD-10-CM

## 2019-10-23 DIAGNOSIS — G8929 Other chronic pain: Secondary | ICD-10-CM

## 2019-10-23 NOTE — Therapy (Signed)
[facility] [address] [place], [place], [address] Phone: [phone]   Fax:  [phone]  Physical Therapy Treatment  Patient Details  Name: Johnny [name] MRN: [id number] Date of Birth: [birth year] Referring Provider (PT): Dimas Chyle   Encounter Date: 10/23/2019   PT End of Session - 10/23/19 1310    Visit Number 7    Number of Visits 12    Date for PT Re-Evaluation 11/01/19    Authorization Type [employer]    PT Start Time 1540    PT Stop Time 1345    PT Time Calculation (min) 40 min    Activity Tolerance Patient tolerated treatment well    Behavior During Therapy [facility] for tasks assessed/performed           Past Medical History:  Diagnosis Date  . Crohn's disease ([place])   . GERD (gastroesophageal reflux disease)   . Hyperlipidemia   . Hypertension     Past Surgical History:  Procedure Laterality Date  . LIVER BIOPSY      There were no vitals filed for this visit.   Subjective Assessment - 10/23/19 1435    Subjective Pt has variable pain. Was doing better after DN last session, but tried standing desk yesterday and thinks back is sore from that.    Patient Stated Goals Decreased back pain    Currently in Pain? Yes    Pain Location Back    Pain Orientation Right;Left    Pain Descriptors / Indicators Aching    Pain Type Acute pain    Pain Onset More than a month ago    Pain Frequency Intermittent                             [facility] Adult PT Treatment/Exercise - 10/23/19 1311      Exercises   Exercises Lumbar      Lumbar Exercises: Stretches   Active Hamstring Stretch 3 reps;30 seconds    Active Hamstring Stretch Limitations seated    Single Knee to Chest Stretch 3 reps;30 seconds    Piriformis Stretch 2 reps;30 seconds    Piriformis Stretch Limitations supine     Other Lumbar Stretch Exercise childs pose 30 sec x 3 ea,  l/r/center      Lumbar Exercises: Aerobic   Recumbent Bike L2 x 8  min       Lumbar Exercises: Standing   Row 20 reps    Theraband Level (Row) Level 4 (Blue)    Other Standing Lumbar Exercises standing horiz abd GTB x 20 with TA      Lumbar Exercises: Supine   Bridge 20 reps    Straight Leg Raise 10 reps    Other Supine Lumbar Exercises mod crunch x 20;       Lumbar Exercises: Sidelying   Hip Abduction --      Lumbar Exercises: Prone   Other Prone Lumbar Exercises --      Lumbar Exercises: Quadruped   Madcat/Old Horse 20 reps    Opposite Arm/Leg Raise 20 reps      Manual Therapy   Manual Therapy Joint mobilization;Soft tissue mobilization    Manual therapy comments --    Joint Mobilization PA mobs thoracic and lumbar spine    Soft tissue mobilization DTM and IASTM to Bil thoracic and lumbar paraspinals.  PT Short Term Goals - 09/24/19 1340      PT SHORT TERM GOAL #1   Title Pt to be independent with initial HEP    Time 2    Period Weeks    Status New    Target Date 10/04/19      PT SHORT TERM GOAL #2   Title Pt to report standing for work duties, at least 50% of the time.    Time 6    Period Weeks    Status New    Target Date 10/04/19             PT Long Term Goals - 09/24/19 1341      PT LONG TERM GOAL #1   Title Pt to be independent with final HEP    Time 6    Period Weeks    Status New    Target Date 11/01/19      PT LONG TERM GOAL #2   Title Pt to report decreased pain in low back and L LE to 0-2/10 with sitting, work duties, and bending.    Time 6    Period Weeks    Status New    Target Date 11/01/19      PT LONG TERM GOAL #3   Title Pt to demo proper mechanics for bend/lift/squat technique    Time 6    Period Weeks    Status New    Target Date 11/01/19                 Plan - 10/23/19 1436    Clinical Impression Statement Pt continues to have soreness and tightness in high lumbar region. No increased pain with activities today. Pt improving with ability for ther ex  and stabilization. Continued discussion on importance of posture and HEP. Pt will f/u with MD this week, will benefit from continued care.    Personal Factors and Comorbidities Time since onset of injury/illness/exacerbation;Profession    Examination-Activity Limitations Locomotion Level;Bend;Sit;Squat;Lift;Stand    Examination-Participation Restrictions Cleaning;Occupation;Driving;Laundry;Yard Work;Community Activity    Stability/Clinical Decision Making Stable/Uncomplicated    Rehab Potential Good    PT Frequency 2x / week    PT Duration 6 weeks    PT Treatment/Interventions ADLs/Self Care Home Management;Cryotherapy;Electrical Stimulation;Iontophoresis 42m/ml Dexamethasone;Moist Heat;Ultrasound;Traction;Therapeutic exercise;Therapeutic activities;Functional mobility training;Stair training;Gait training;Patient/family education;Dry needling;Passive range of motion;Taping;Vasopneumatic Device;Spinal Manipulations;Joint Manipulations;Manual techniques    Consulted and Agree with Plan of Care Patient           Patient will benefit from skilled therapeutic intervention in order to improve the following deficits and impairments:  Decreased range of motion, Increased muscle spasms, Decreased activity tolerance, Pain, Impaired flexibility, Improper body mechanics, Decreased mobility, Decreased strength  Visit Diagnosis: Chronic bilateral low back pain with left-sided sciatica     Problem List Patient Active Problem List   Diagnosis Date Noted  . Nonallopathic lesion of sacral region 08/24/2018  . Nonallopathic lesion of lumbosacral region 08/24/2018  . Nonallopathic lesion of thoracic region 08/24/2018  . Low back pain 07/27/2018  . Vitamin D deficiency 11/29/2017  . Prediabetes 12/24/2016  . Tinnitus 12/24/2016  . [place]hn's disease ([place] 06/11/2016  . Eczema 06/11/2016  . Fatty liver 06/11/2016  . GERD (gastroesophageal reflux disease) 06/11/2016  . IBS (irritable bowel syndrome)  06/11/2016  . Vitamin B12 deficiency 06/11/2016  . Dyslipidemia 05/25/2014  . Benign essential hypertension 10/01/2013  . Metabolic syndrome 151/70/0174   LLyndee Hensen PT, DPT 2:39 PM  10/23/19    [facility] [address] [place], [place], [address] Phone: [phone]   Fax:  [phone]  Name: Johnny [name] MRN: [id number] Date of Birth: 19-Nov-1980

## 2019-10-24 NOTE — Progress Notes (Signed)
[facility] Phone: [phone] Subjective:   Johnny [name], am serving as a scribe for Dr. Hulan Saas. This visit occurred during the SARS-CoV-2 public health emergency.  Safety protocols were in place, including screening questions prior to the visit, additional usage of staff PPE, and extensive cleaning of exam room while observing appropriate contact time as indicated for disinfecting solutions.   I'm seeing this patient by the request  of:  Vivi Barrack, MD  CC: Low back and neck pain follow-up  EYC:[id number]  Johnny [name] is a 39 y.o. male coming in with complaint of back and neck pain. OMT 08/28/2019. Patient has been going to PT at [facility] office.  Patient states that he stretches a lot and is still in pain. Has good and bad days.  Overall does think he has made some mild improvement.  Feels like the epidural has been the most helpful of anything so far though.  Medications patient has been prescribed: Gabapentin 416m, Vit D once weekly  Taking: Yes         Reviewed prior external information including notes and imaging from previsou exam, outside providers and external EMR if available.   As well as notes that were available from care everywhere and other healthcare systems.  Past medical history, social, surgical and family history all reviewed in electronic medical record.  [name] information unless stated regarding to the chief complaint.   Past Medical History:  Diagnosis Date  . [place]hn's disease ([place]   . GERD (gastroesophageal reflux disease)   . Hyperlipidemia   . Hypertension     Allergies  Allergen Reactions  . Nsaids   . Other     Champagne causes vomiting, diarrhea, and nausea.  . Codeine Nausea Only     Review of Systems:  [name], visual changes, nausea, vomiting, diarrhea, constipation, dizziness, abdominal pain, skin rash, fevers, chills, night sweats, weight  loss, swollen lymph nodes, body aches, joint swelling, chest pain, shortness of breath, mood changes. POSITIVE muscle aches  Objective  Blood pressure 128/88, pulse 91, height 6' 4"  (1.93 m), weight 244 lb (110.7 kg), SpO2 97 %.   General: [name] apparent distress alert and oriented x3 mood and affect normal, dressed appropriately.  HEENT: Pupils equal, extraocular movements intact  Respiratory: Patient's speak in full sentences and does not appear short of breath  Cardiovascular: [name] lower extremity edema, non tender, [name] erythema  Neuro: Cranial nerves II through XII are intact, neurovascularly intact in all extremities with 2+ DTRs and 2+ pulses.  Gait normal with good balance and coordination.  MSK:  Non tender with full range of motion and good stability and symmetric strength and tone of shoulders, elbows, wrist, hip, knee and ankles bilaterally.  Back -low back exam does have some mild loss of lordosis, tightness with FABER test.  Patient noted does have some mild improvement in range of motion of the thoracolumbar juncture.  Osteopathic findings  T8 extended rotated and side bent left L2 flexed rotated and side bent right Sacrum right on right       Assessment and Plan:   Low back pain Continued low back pain.  Chronic problem with mild exacerbation but doing well with the physical therapy.  Patient given a trial long-acting gabapentin and see if that will be more beneficial.  Discussed which activities to doing which wants to avoid.  Increase activity slowly.  Discussed core strengthening exercises and  physical therapy.  We discussed the potential for Effexor follow-up again in 4 to 8 weeks    Nonallopathic problems  Decision today to treat with OMT was based on Physical Exam  After verbal consent patient was treated with HVLA, ME, FPR techniques in  thoracic, lumbar, and sacral  areas  Patient tolerated the procedure well with improvement in symptoms  Patient given  exercises, stretches and lifestyle modifications  See medications in patient instructions if given  Patient will follow up in 4-8 weeks      The above documentation has been reviewed and is accurate and complete [name], DO       Note: This dictation was prepared with Dragon dictation along with smaller phrase technology. Any transcriptional errors that result from this process are unintentional.

## 2019-10-25 ENCOUNTER — Encounter: Payer: Self-pay | Admitting: Family Medicine

## 2019-10-25 ENCOUNTER — Ambulatory Visit: Payer: PRIVATE HEALTH INSURANCE | Admitting: Family Medicine

## 2019-10-25 ENCOUNTER — Other Ambulatory Visit: Payer: Self-pay

## 2019-10-25 ENCOUNTER — Encounter: Payer: PRIVATE HEALTH INSURANCE | Admitting: Physical Therapy

## 2019-10-25 VITALS — BP 128/88 | HR 91 | Ht 76.0 in | Wt 244.0 lb

## 2019-10-25 DIAGNOSIS — M545 Low back pain: Secondary | ICD-10-CM | POA: Diagnosis not present

## 2019-10-25 DIAGNOSIS — M999 Biomechanical lesion, unspecified: Secondary | ICD-10-CM | POA: Diagnosis not present

## 2019-10-25 DIAGNOSIS — G8929 Other chronic pain: Secondary | ICD-10-CM

## 2019-10-25 NOTE — Patient Instructions (Signed)
Try 327m Horizant  first if not too tired try 6037mone night In one month send me an update If not much better will consider starting effexor 37.5 If things worsen can repeat epidural See me in 2 months

## 2019-10-25 NOTE — Assessment & Plan Note (Signed)
Continued low back pain.  Chronic problem with mild exacerbation but doing well with the physical therapy.  Patient given a trial long-acting gabapentin and see if that will be more beneficial.  Discussed which activities to doing which wants to avoid.  Increase activity slowly.  Discussed core strengthening exercises and physical therapy.  We discussed the potential for Effexor follow-up again in 4 to 8 weeks

## 2019-11-01 ENCOUNTER — Other Ambulatory Visit: Payer: Self-pay

## 2019-11-01 ENCOUNTER — Ambulatory Visit: Payer: PRIVATE HEALTH INSURANCE | Admitting: Physical Therapy

## 2019-11-01 ENCOUNTER — Encounter: Payer: Self-pay | Admitting: Physical Therapy

## 2019-11-01 DIAGNOSIS — G8929 Other chronic pain: Secondary | ICD-10-CM

## 2019-11-01 DIAGNOSIS — M5442 Lumbago with sciatica, left side: Secondary | ICD-10-CM

## 2019-11-02 NOTE — Therapy (Signed)
[facility] [address] [place], [place], [address] Phone: [phone]   Fax:  [phone]  Physical Therapy Treatment/Re-Cert  Patient Details  Name: Johnny [name] MRN: [id number] Date of Birth: Jun 08, 1980 Referring Provider (PT): Dimas Chyle   Encounter Date: 11/01/2019   PT End of Session - 11/01/19 1353    Visit Number 8    Number of Visits 20    Date for PT Re-Evaluation 11/29/19    Authorization Type [employer]    PT Start Time 1345    PT Stop Time 1430    PT Time Calculation (min) 45 min    Activity Tolerance Patient tolerated treatment well    Behavior During Therapy [facility] for tasks assessed/performed           Past Medical History:  Diagnosis Date  . Crohn's disease ([place])   . GERD (gastroesophageal reflux disease)   . Hyperlipidemia   . Hypertension     Past Surgical History:  Procedure Laterality Date  . LIVER BIOPSY      There were no vitals filed for this visit.   Subjective Assessment - 11/01/19 1352    Subjective Pt continues to report soreness in upper lumbar region, mostly with bending.    Patient Stated Goals Decreased back pain    Currently in Pain? Yes    Pain Score 5     Pain Location Back    Pain Orientation Right;Left    Pain Descriptors / Indicators Aching    Pain Type Acute pain    Pain Onset More than a month ago    Pain Frequency Intermittent              [facility] PT Assessment - 11/02/19 0001      AROM   Overall AROM Comments HIps: WFL    Lumbar Flexion min limitation    Lumbar Extension WFl    Lumbar - Right Side Bend wfl    Lumbar - Left Side Bend wfl      Strength   Overall Strength Comments hips: 4+/5, core: 4/5       Palpation   Palpation comment Pain at L low lumbar musculature, L SI, mild in L glute, and pain in central lumbar (high) lumbar region                          [facility] Adult PT Treatment/Exercise - 11/01/19 1359      Exercises   Exercises  Lumbar      Lumbar Exercises: Stretches   Active Hamstring Stretch --    Active Hamstring Stretch Limitations --    Single Knee to Chest Stretch 3 reps;30 seconds    Piriformis Stretch 2 reps;30 seconds    Piriformis Stretch Limitations supine     Other Lumbar Stretch Exercise --      Lumbar Exercises: Aerobic   Recumbent Bike L2 x 8 min       Lumbar Exercises: Standing   Row 20 reps    Theraband Level (Row) Level 4 (Blue)    Other Standing Lumbar Exercises EIS x 20;     Other Standing Lumbar Exercises side glides at wall x 15;       Lumbar Exercises: Supine   Bridge 20 reps    Straight Leg Raise --    Other Supine Lumbar Exercises --      Lumbar Exercises: Quadruped   Madcat/Old Horse 20 reps    Opposite Arm/Leg Raise  20 reps    Plank 30 sec x 3;       Manual Therapy   Manual Therapy Joint mobilization;Soft tissue mobilization    Joint Mobilization PA mobs thoracic and lumbar spine    Soft tissue mobilization DTM and IASTM to Bil thoracic and lumbar paraspinals.                   PT Education - 11/02/19 1220    Education Details HEp reviewed    Person(s) Educated Patient    Methods Explanation;Demonstration;Verbal cues    Comprehension Verbalized understanding;Returned demonstration;Verbal cues required            PT Short Term Goals - 09/24/19 1340      PT SHORT TERM GOAL #1   Title Pt to be independent with initial HEP    Time 2    Period Weeks    Status New    Target Date 10/04/19      PT SHORT TERM GOAL #2   Title Pt to report standing for work duties, at least 50% of the time.    Time 6    Period Weeks    Status New    Target Date 10/04/19             PT Long Term Goals - 11/01/19 1355      PT LONG TERM GOAL #1   Title Pt to be independent with final HEP    Time 6    Period Weeks    Status Partially Met    Target Date 11/29/19      PT LONG TERM GOAL #2   Title Pt to report decreased pain in low back and L LE to 0-2/10 with  sitting, work duties, and bending.    Time 6    Period Weeks    Status On-going    Target Date 11/29/19      PT LONG TERM GOAL #3   Title Pt to demo proper mechanics for bend/lift/squat technique    Time 6    Period Weeks    Status Achieved                 Plan - 11/02/19 1221    Clinical Impression Statement Pt with continued soreness. Pt with minimal pain in low lumbar region at area of pathology on imaging. Most pain is in high lumbar region, bilaterally, with flexion. Progress has been slower than expected with given treatments. Pt to benefit from continued strengthening and education on HEP. Likely  for 1-2 more week, then plan to d/c to HEP, and return to MD as needed.    Personal Factors and Comorbidities Time since onset of injury/illness/exacerbation;Profession    Examination-Activity Limitations Locomotion Level;Bend;Sit;Squat;Lift;Stand    Examination-Participation Restrictions Cleaning;Occupation;Driving;Laundry;Yard Work;Community Activity    Stability/Clinical Decision Making Stable/Uncomplicated    Rehab Potential Good    PT Frequency 2x / week    PT Duration 6 weeks    PT Treatment/Interventions ADLs/Self Care Home Management;Cryotherapy;Electrical Stimulation;Iontophoresis 12m/ml Dexamethasone;Moist Heat;Ultrasound;Traction;Therapeutic exercise;Therapeutic activities;Functional mobility training;Stair training;Gait training;Patient/family education;Dry needling;Passive range of motion;Taping;Vasopneumatic Device;Spinal Manipulations;Joint Manipulations;Manual techniques    Consulted and Agree with Plan of Care Patient           Patient will benefit from skilled therapeutic intervention in order to improve the following deficits and impairments:  Decreased range of motion, Increased muscle spasms, Decreased activity tolerance, Pain, Impaired flexibility, Improper body mechanics, Decreased mobility, Decreased strength  Visit Diagnosis: Chronic bilateral low back  pain with left-sided sciatica     Problem List Patient Active Problem List   Diagnosis Date Noted  . Nonallopathic lesion of sacral region 08/24/2018  . Nonallopathic lesion of lumbosacral region 08/24/2018  . Nonallopathic lesion of thoracic region 08/24/2018  . Low back pain 07/27/2018  . Vitamin D deficiency 11/29/2017  . Prediabetes 12/24/2016  . Tinnitus 12/24/2016  . Crohn's disease ([place]) 06/11/2016  . Eczema 06/11/2016  . Fatty liver 06/11/2016  . GERD (gastroesophageal reflux disease) 06/11/2016  . IBS (irritable bowel syndrome) 06/11/2016  . Vitamin B12 deficiency 06/11/2016  . Dyslipidemia 05/25/2014  . Benign essential hypertension 10/01/2013  . Metabolic syndrome 96/78/9381   Lyndee Hensen, PT, DPT 12:27 PM  11/02/19    [facility] [place], [place], [address] Phone: [phone]   Fax:  [phone]  Name: Johnny [name] MRN: [id number] Date of Birth: 08/04/1980

## 2019-11-06 ENCOUNTER — Encounter: Payer: Self-pay | Admitting: Physical Therapy

## 2019-11-06 ENCOUNTER — Other Ambulatory Visit: Payer: Self-pay

## 2019-11-06 ENCOUNTER — Ambulatory Visit: Payer: PRIVATE HEALTH INSURANCE | Admitting: Physical Therapy

## 2019-11-06 DIAGNOSIS — G8929 Other chronic pain: Secondary | ICD-10-CM | POA: Diagnosis not present

## 2019-11-06 DIAGNOSIS — M5442 Lumbago with sciatica, left side: Secondary | ICD-10-CM

## 2019-11-06 NOTE — Therapy (Signed)
[facility] [address] [place], [place], [address] Phone: [phone]   Fax:  [phone]  Physical Therapy Treatment  Patient Details  Name: Johnny [name] MRN: [id number] Date of Birth: 10-09-1980 Referring Provider (PT): Dimas Chyle   Encounter Date: 11/06/2019   PT End of Session - 11/06/19 1352    Visit Number 9    Number of Visits 20    Date for PT Re-Evaluation 11/29/19    Authorization Type [employer]    PT Start Time 1348    PT Stop Time 1430    PT Time Calculation (min) 42 min    Activity Tolerance Patient tolerated treatment well    Behavior During Therapy WFL for tasks assessed/performed           Past Medical History:  Diagnosis Date   Crohn's disease ([place])    GERD (gastroesophageal reflux disease)    Hyperlipidemia    Hypertension     Past Surgical History:  Procedure Laterality Date   LIVER BIOPSY      There were no vitals filed for this visit.   Subjective Assessment - 11/06/19 1351    Subjective Pt states variable pain, continued pain daily.    Patient Stated Goals Decreased back pain    Currently in Pain? Yes    Pain Score 5     Pain Location Back    Pain Orientation Right;Left    Pain Descriptors / Indicators Aching    Pain Type Acute pain    Pain Onset More than a month ago    Pain Frequency Intermittent                             [facility] Adult PT Treatment/Exercise - 11/06/19 1353      Exercises   Exercises Lumbar      Lumbar Exercises: Stretches   Single Knee to Chest Stretch 3 reps;30 seconds    Standing Extension 20 reps    Piriformis Stretch 2 reps;30 seconds    Piriformis Stretch Limitations supine       Lumbar Exercises: Aerobic   Recumbent Bike L2 x 6 min       Lumbar Exercises: Standing   Functional Squats 20 reps    Functional Squats Limitations 20lb     Lifting Limitations Dead lift 20 lb x 15;     Row 20 reps    Theraband Level (Row) Level 4  (Blue)    Other Standing Lumbar Exercises --    Other Standing Lumbar Exercises HIp abd YT x 20;  Walk/March x 20;       Lumbar Exercises: Supine   Bridge 20 reps    Straight Leg Raise 10 reps    Other Supine Lumbar Exercises mod crunch x 20;       Lumbar Exercises: Quadruped   Madcat/Old Horse 20 reps    Opposite Arm/Leg Raise 20 reps    Plank 30 sec x 3;       Manual Therapy   Manual Therapy Joint mobilization;Soft tissue mobilization    Joint Mobilization PA mobs thoracic and lumbar spine    Soft tissue mobilization DTM and IASTM to Bil thoracic and lumbar paraspinals.                     PT Short Term Goals - 09/24/19 1340      PT SHORT TERM GOAL #1   Title Pt to be  independent with initial HEP    Time 2    Period Weeks    Status New    Target Date 10/04/19      PT SHORT TERM GOAL #2   Title Pt to report standing for work duties, at least 50% of the time.    Time 6    Period Weeks    Status New    Target Date 10/04/19             PT Long Term Goals - 11/01/19 1355      PT LONG TERM GOAL #1   Title Pt to be independent with final HEP    Time 6    Period Weeks    Status Partially Met    Target Date 11/29/19      PT LONG TERM GOAL #2   Title Pt to report decreased pain in low back and L LE to 0-2/10 with sitting, work duties, and bending.    Time 6    Period Weeks    Status On-going    Target Date 11/29/19      PT LONG TERM GOAL #3   Title Pt to demo proper mechanics for bend/lift/squat technique    Time 6    Period Weeks    Status Achieved                 Plan - 11/06/19 2045    Clinical Impression Statement Pt with most soreness in bil upper lumbar paraspinals with deep palpation and DTM today. Reviewed squat, bend, lift technique, pt continued to req mod cueing wiht this, and has tendency for hyperextension of lumbar spine. Challenged with core strength as well, will progress as able.    Personal Factors and Comorbidities Time  since onset of injury/illness/exacerbation;Profession    Examination-Activity Limitations Locomotion Level;Bend;Sit;Squat;Lift;Stand    Examination-Participation Restrictions Cleaning;Occupation;Driving;Laundry;Yard Work;Community Activity    Stability/Clinical Decision Making Stable/Uncomplicated    Rehab Potential Good    PT Frequency 2x / week    PT Duration 6 weeks    PT Treatment/Interventions ADLs/Self Care Home Management;Cryotherapy;Electrical Stimulation;Iontophoresis 57m/ml Dexamethasone;Moist Heat;Ultrasound;Traction;Therapeutic exercise;Therapeutic activities;Functional mobility training;Stair training;Gait training;Patient/family education;Dry needling;Passive range of motion;Taping;Vasopneumatic Device;Spinal Manipulations;Joint Manipulations;Manual techniques    Consulted and Agree with Plan of Care Patient           Patient will benefit from skilled therapeutic intervention in order to improve the following deficits and impairments:  Decreased range of motion, Increased muscle spasms, Decreased activity tolerance, Pain, Impaired flexibility, Improper body mechanics, Decreased mobility, Decreased strength  Visit Diagnosis: Chronic bilateral low back pain with left-sided sciatica     Problem List Patient Active Problem List   Diagnosis Date Noted   Nonallopathic lesion of sacral region 08/24/2018   Nonallopathic lesion of lumbosacral region 08/24/2018   Nonallopathic lesion of thoracic region 08/24/2018   Low back pain 07/27/2018   Vitamin D deficiency 11/29/2017   Prediabetes 12/24/2016   Tinnitus 12/24/2016   [place]'s disease ([place] 06/11/2016   Eczema 06/11/2016   Fatty liver 06/11/2016   GERD (gastroesophageal reflux disease) 06/11/2016   IBS (irritable bowel syndrome) 06/11/2016   Vitamin B12 deficiency 06/11/2016   Dyslipidemia 05/25/2014   Benign essential hypertension 087/68/1157  Metabolic syndrome 126/20/3559   LLyndee Hensen PT,  DPT 8:49 PM  11/06/19    [place] [address]Phone: [phone]  Fax:  [phone] Name: Johnny MARENGOMRN: [id number] of  Birth: 12-Oct-1980

## 2019-11-14 ENCOUNTER — Encounter: Payer: Self-pay | Admitting: Physical Therapy

## 2019-11-14 ENCOUNTER — Ambulatory Visit: Payer: PRIVATE HEALTH INSURANCE | Admitting: Physical Therapy

## 2019-11-14 ENCOUNTER — Other Ambulatory Visit: Payer: Self-pay

## 2019-11-14 DIAGNOSIS — G8929 Other chronic pain: Secondary | ICD-10-CM | POA: Diagnosis not present

## 2019-11-14 DIAGNOSIS — M5442 Lumbago with sciatica, left side: Secondary | ICD-10-CM

## 2019-11-14 NOTE — Therapy (Addendum)
[facility] [address] [place], [place], [address] Phone: [phone]   Fax:  [phone]  Physical Therapy Treatment  Patient Details  Name: Johnny [name] MRN: [id number] Date of Birth: May 19, 1980 Referring Provider (PT): Dimas Chyle   Encounter Date: 11/14/2019   PT End of Session - 11/14/19 1356    Visit Number 10    Number of Visits 20    Date for PT Re-Evaluation 11/29/19    Authorization Type [employer]    PT Start Time 1348    PT Stop Time 1426    PT Time Calculation (min) 38 min    Activity Tolerance Patient tolerated treatment well    Behavior During Therapy [facility] for tasks assessed/performed           Past Medical History:  Diagnosis Date  . Crohn's disease ([place])   . GERD (gastroesophageal reflux disease)   . Hyperlipidemia   . Hypertension     Past Surgical History:  Procedure Laterality Date  . LIVER BIOPSY      There were no vitals filed for this visit.   Subjective Assessment - 11/14/19 1354    Subjective Pt states increased pain on sunday, has been very sore this week since then, has had to use more meds. Flexion and ext both painful. Childs pose stretch comfortable.    Currently in Pain? Yes    Pain Score 7     Pain Location Back    Pain Orientation Right;Left    Pain Descriptors / Indicators Aching    Pain Type Acute pain    Pain Onset More than a month ago    Pain Frequency Intermittent                             [facility] Adult PT Treatment/Exercise - 11/14/19 1358      Exercises   Exercises Lumbar      Lumbar Exercises: Stretches   Single Knee to Chest Stretch 3 reps;30 seconds    Lower Trunk Rotation 5 reps;10 seconds    Standing Extension 10 reps    Standing Extension Limitations pain    Piriformis Stretch 2 reps;30 seconds    Piriformis Stretch Limitations supine     Other Lumbar Stretch Exercise childs pose 30 sec x 3;       Lumbar Exercises: Aerobic   Recumbent  Bike L2 x 8 min       Lumbar Exercises: Standing   Functional Squats --    Functional Squats Limitations --    Lifting Limitations --    Row 20 reps    Theraband Level (Row) Level 4 (Blue)    Other Standing Lumbar Exercises --      Lumbar Exercises: Supine   Bent Knee Raise 20 reps    Bridge --    Straight Leg Raise --    Other Supine Lumbar Exercises mod crunch x 20;       Lumbar Exercises: Quadruped   Madcat/Old Horse 20 reps    Opposite Arm/Leg Raise --    Plank --      Manual Therapy   Manual Therapy Joint mobilization;Soft tissue mobilization    Joint Mobilization PA mobs thoracic and lumbar spine    Soft tissue mobilization DTM and IASTM to Bil thoracic and lumbar paraspinals.                     PT Short Term Goals -  11/14/19 1357      PT SHORT TERM GOAL #1   Title Pt to be independent with initial HEP    Time 2    Period Weeks    Status Achieved    Target Date 10/04/19      PT SHORT TERM GOAL #2   Title Pt to report standing for work duties, at least 50% of the time.    Time 6    Period Weeks    Status Achieved    Target Date 10/04/19             PT Long Term Goals - 11/14/19 1525      PT LONG TERM GOAL #1   Title Pt to be independent with final HEP    Time 6    Period Weeks    Status Achieved      PT LONG TERM GOAL #2   Title Pt to report decreased pain in low back and L LE to 0-2/10 with sitting, work duties, and bending.    Time 6    Period Weeks    Status On-going      PT LONG TERM GOAL #3   Title Pt to demo proper mechanics for bend/lift/squat technique    Time 6    Period Weeks    Status Achieved                 Plan - 11/14/19 1526    Clinical Impression Statement Pt with increased pain in last few days. He has had minimal pain relief in 10 visits. He continues to have most pain in region of low thoracic/high lumbar region. Also states mild increase in soreness after most PT visits. Discussed HEP and activities to  continue at this time.Discussed referal back to MD, for further assessment. Pt in agreement with plan, will hold PT at this time.    Personal Factors and Comorbidities Time since onset of injury/illness/exacerbation;Profession    Examination-Activity Limitations Locomotion Level;Bend;Sit;Squat;Lift;Stand    Examination-Participation Restrictions Cleaning;Occupation;Driving;Laundry;Yard Work;Community Activity    Stability/Clinical Decision Making Stable/Uncomplicated    Rehab Potential Good    PT Frequency 2x / week    PT Duration 6 weeks    PT Treatment/Interventions ADLs/Self Care Home Management;Cryotherapy;Electrical Stimulation;Iontophoresis 81m/ml Dexamethasone;Moist Heat;Ultrasound;Traction;Therapeutic exercise;Therapeutic activities;Functional mobility training;Stair training;Gait training;Patient/family education;Dry needling;Passive range of motion;Taping;Vasopneumatic Device;Spinal Manipulations;Joint Manipulations;Manual techniques    Consulted and Agree with Plan of Care Patient           Patient will benefit from skilled therapeutic intervention in order to improve the following deficits and impairments:  Decreased range of motion, Increased muscle spasms, Decreased activity tolerance, Pain, Impaired flexibility, Improper body mechanics, Decreased mobility, Decreased strength  Visit Diagnosis: Chronic bilateral low back pain with left-sided sciatica     Problem List Patient Active Problem List   Diagnosis Date Noted  . Nonallopathic lesion of sacral region 08/24/2018  . Nonallopathic lesion of lumbosacral region 08/24/2018  . Nonallopathic lesion of thoracic region 08/24/2018  . Low back pain 07/27/2018  . Vitamin D deficiency 11/29/2017  . Prediabetes 12/24/2016  . Tinnitus 12/24/2016  . Crohn's disease ([place] 06/11/2016  . Eczema 06/11/2016  . Fatty liver 06/11/2016  . GERD (gastroesophageal reflux disease) 06/11/2016  . IBS (irritable bowel syndrome) 06/11/2016   . Vitamin B12 deficiency 06/11/2016  . Dyslipidemia 05/25/2014  . Benign essential hypertension 10/01/2013  . Metabolic syndrome 102/12/1550   LLyndee Hensen PT, DPT 3:29 PM  11/14/19    [facility] [place]  [place], [place], [address] Phone: [phone]   Fax:  [phone]  Name: Johnny [name] MRN: [id number] Date of Birth: [birth year]    PHYSICAL THERAPY DISCHARGE SUMMARY  Visits from Start of Care: 10 Plan: Patient agrees to discharge.  Patient goals were partially met. Patient is being discharged due to lack of progress.  ?????     Referred back to MD.    Lyndee Hensen, PT, DPT 11:01 AM  06/17/20

## 2019-11-15 ENCOUNTER — Encounter: Payer: Self-pay | Admitting: Family Medicine

## 2019-11-17 MED ORDER — PREDNISONE 50 MG PO TABS: 50.0000 mg | ORAL_TABLET | Freq: Every day | ORAL | 0 refills | Status: DC

## 2019-11-22 ENCOUNTER — Encounter: Payer: PRIVATE HEALTH INSURANCE | Admitting: Physical Therapy

## 2019-11-29 ENCOUNTER — Encounter: Payer: PRIVATE HEALTH INSURANCE | Admitting: Physical Therapy

## 2019-12-27 ENCOUNTER — Other Ambulatory Visit: Payer: Self-pay

## 2019-12-27 ENCOUNTER — Ambulatory Visit: Payer: PRIVATE HEALTH INSURANCE | Admitting: Family Medicine

## 2019-12-27 ENCOUNTER — Encounter: Payer: Self-pay | Admitting: Family Medicine

## 2019-12-27 VITALS — BP 118/86 | HR 74 | Ht 76.0 in | Wt 245.0 lb

## 2019-12-27 DIAGNOSIS — M5416 Radiculopathy, lumbar region: Secondary | ICD-10-CM | POA: Diagnosis not present

## 2019-12-27 DIAGNOSIS — G8929 Other chronic pain: Secondary | ICD-10-CM | POA: Diagnosis not present

## 2019-12-27 DIAGNOSIS — M545 Low back pain, unspecified: Secondary | ICD-10-CM

## 2019-12-27 DIAGNOSIS — M999 Biomechanical lesion, unspecified: Secondary | ICD-10-CM | POA: Diagnosis not present

## 2019-12-27 MED ORDER — VENLAFAXINE HCL ER 37.5 MG PO CP24: 37.5000 mg | ORAL_CAPSULE | Freq: Every day | ORAL | 0 refills | Status: DC

## 2019-12-27 NOTE — Progress Notes (Signed)
[facility] [place] Phone: [phone] Subjective:   Johnny Johnny [name], am serving as a scribe for Dr. Hulan Saas. This visit occurred during the SARS-CoV-2 public health emergency.  Safety protocols were in place, including screening questions prior to the visit, additional usage of staff PPE, and extensive cleaning of exam room while observing appropriate contact time as indicated for disinfecting solutions.  I'm seeing this patient by the request  of:  Johnny Barrack, MD  CC: Low back pain follow-up  SKA:[id number]  Johnny Johnny [name] is a 39 y.o. male coming in with complaint of back and neck pain. OMT 10/25/2019. Patient states that his back pain has gotten worse. Having tingling in bilateral lateral hip. Patient is using gabapentin. Patient tried Horizant and did not feel that it worked any better. Pain in spine runs from thoracic to lumbar spine. Does try to stretch when he can.  Patient does not know what seems to make it worse or better.  Medications patient has been prescribed: Gabapentin, Vit D  Taking:         Reviewed prior external information including notes and imaging from previsou exam, outside providers and external EMR if available.   As well as notes that were available from care everywhere and other healthcare systems.  Past medical history, social, surgical and family history all reviewed in electronic medical record.  Johnny [name] information unless stated regarding to the chief complaint.   Past Medical History:  Diagnosis Date  . Crohn's disease ([place])   . GERD (gastroesophageal reflux disease)   . Hyperlipidemia   . Hypertension     Allergies  Allergen Reactions  . Nsaids   . Other     Champagne causes vomiting, diarrhea, and nausea.  . Codeine Nausea Only     Review of Systems:  Johnny [name], visual changes, nausea, vomiting, diarrhea, constipation, dizziness, abdominal pain, skin rash,  fevers, chills, night sweats, weight loss, swollen lymph nodes, body aches, joint swelling, chest pain, shortness of breath, mood changes. POSITIVE muscle aches  Objective  Blood pressure 118/86, pulse 74, height 6' 4"  (1.93 m), weight 245 lb (111.1 kg), SpO2 98 %.   General: Johnny [name] apparent distress alert and oriented x3 mood and affect normal, dressed appropriately.  HEENT: Pupils equal, extraocular movements intact  Respiratory: Patient's speak in full sentences and does not appear short of breath  Cardiovascular: Johnny [name] lower extremity edema, non tender, Johnny [name] erythema  Gait normal with good balance and coordination.  MSK:  Non tender with full range of motion and good stability and symmetric strength and tone of shoulders, elbows, wrist, hip, knee and ankles bilaterally.  Back -low back exam shows some very mild loss of lordosis.  Tightness noted in the thoracolumbar juncture as well as some mild tightness over the sacroiliac joint.  Negative straight leg test.  Mild tightness with Corky Sox but Johnny [name] true pain.  Johnny [name] pain over the greater trochanteric area.   Osteopathic findings  T8 extended rotated and side bent left L1 flexed rotated and side bent right L5 flexed rotated and side bent left Sacrum right on right       Assessment and Plan: Low back pain Low back pain.  Continues to be painful.  History of Crohn's disease.  Do not noted with patient's imaging very minimal findings at the L5-S1.  Patient did have some mild improvement with a epidural and will try that again.  We will start patient  on Effexor and see how patient responds.  Patient of course wants to avoid anti-inflammatories secondary to Crohn's.  Does respond well to prednisone for the flares.  Patient will follow up with me again in 6 to 8 weeks.  Worsening symptoms will consider maybe a CT abdomen pelvis.    Nonallopathic problems  Decision today to treat with OMT was based on Physical Exam  After verbal consent patient was  treated with HVLA, ME, FPR techniques in cervical, rib, thoracic, lumbar, and sacral  areas  Patient tolerated the procedure well with improvement in symptoms  Patient given exercises, stretches and lifestyle modifications  See medications in patient instructions if given  Patient will follow up in 4-8 weeks      The above documentation has been reviewed and is accurate and complete [name], DO       Note: This dictation was prepared with Dragon dictation along with smaller phrase technology. Any transcriptional errors that result from this process are unintentional.

## 2019-12-27 NOTE — Assessment & Plan Note (Signed)
Low back pain.  Continues to be painful.  History of Crohn's disease.  Do not noted with patient's imaging very minimal findings at the L5-S1.  Patient did have some mild improvement with a epidural and will try that again.  We will start patient on Effexor and see how patient responds.  Patient of course wants to avoid anti-inflammatories secondary to Crohn's.  Does respond well to prednisone for the flares.  Patient will follow up with me again in 6 to 8 weeks.  Worsening symptoms will consider maybe a CT abdomen pelvis.

## 2019-12-27 NOTE — Patient Instructions (Signed)
Effexor 37.5 Epidural [facility] [phone] See me again in 6 weeks if continuing to have problems will consider CT abdomen/pelvis

## 2019-12-31 ENCOUNTER — Encounter: Payer: Self-pay | Admitting: Family Medicine

## 2020-01-01 ENCOUNTER — Other Ambulatory Visit: Payer: Self-pay

## 2020-01-01 ENCOUNTER — Ambulatory Visit
Admission: RE | Admit: 2020-01-01 | Discharge: 2020-01-01 | Disposition: A | Discharge status: Home / Self Care | Payer: PRIVATE HEALTH INSURANCE | Source: Ambulatory Visit | Attending: Family Medicine | Admitting: Family Medicine

## 2020-01-01 DIAGNOSIS — M5416 Radiculopathy, lumbar region: Secondary | ICD-10-CM

## 2020-01-01 IMAGING — XA Imaging study
2 series · 2 of 2 positions shown · non-contrast
Comparison: none

CLINICAL DATA: Low back and leg pain. 30% improvement from the
previous epidural injection. Etiology the pain is not clear from the
previous MRI. Patient has a history of Crohn's disease and psoriasis
and seronegative spondyloarthropathy could be considered, though we
do not have imaging evidence of that.

[Series 2: ortho adipose · 1 of 1 slices shown (1 of 2)]
[im 1/1]
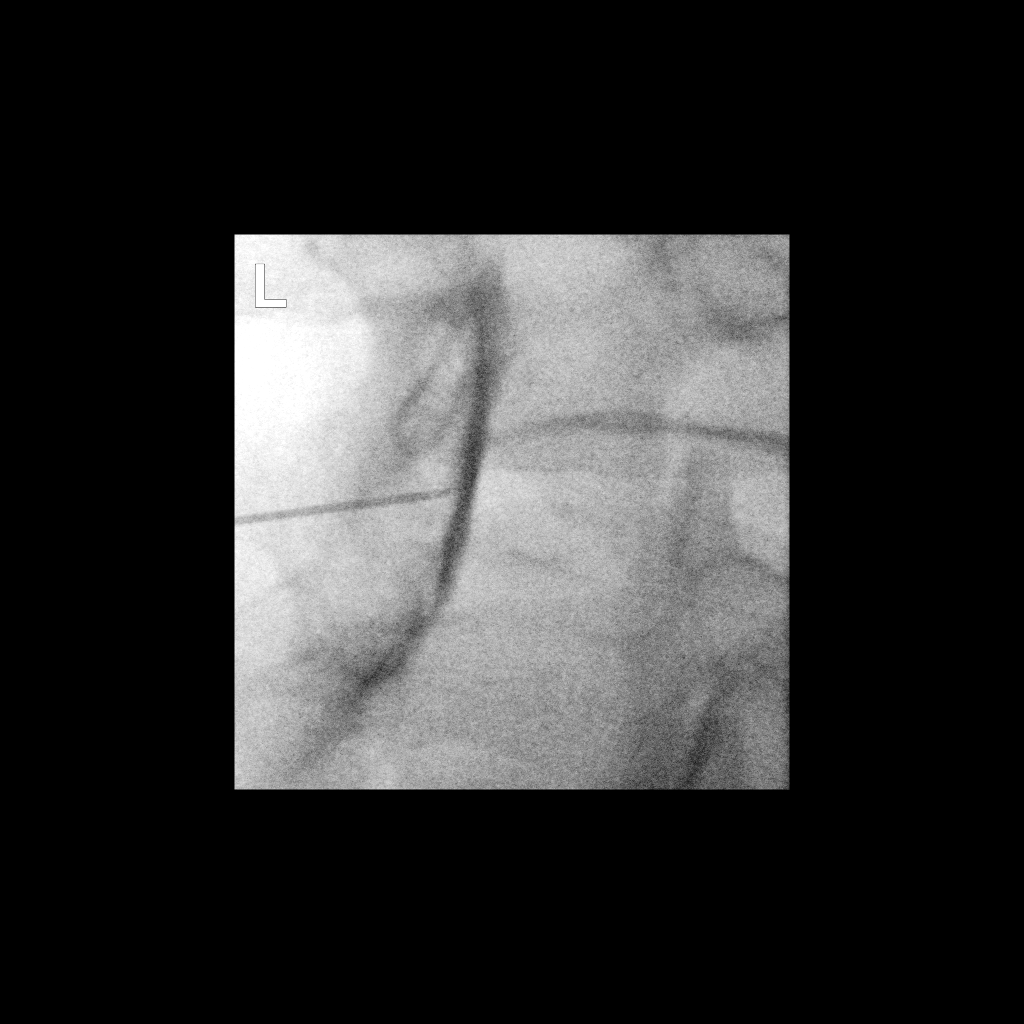

[Series 3: ortho adipose · 1 of 1 slices shown (2 of 2)]
[im 1/1]
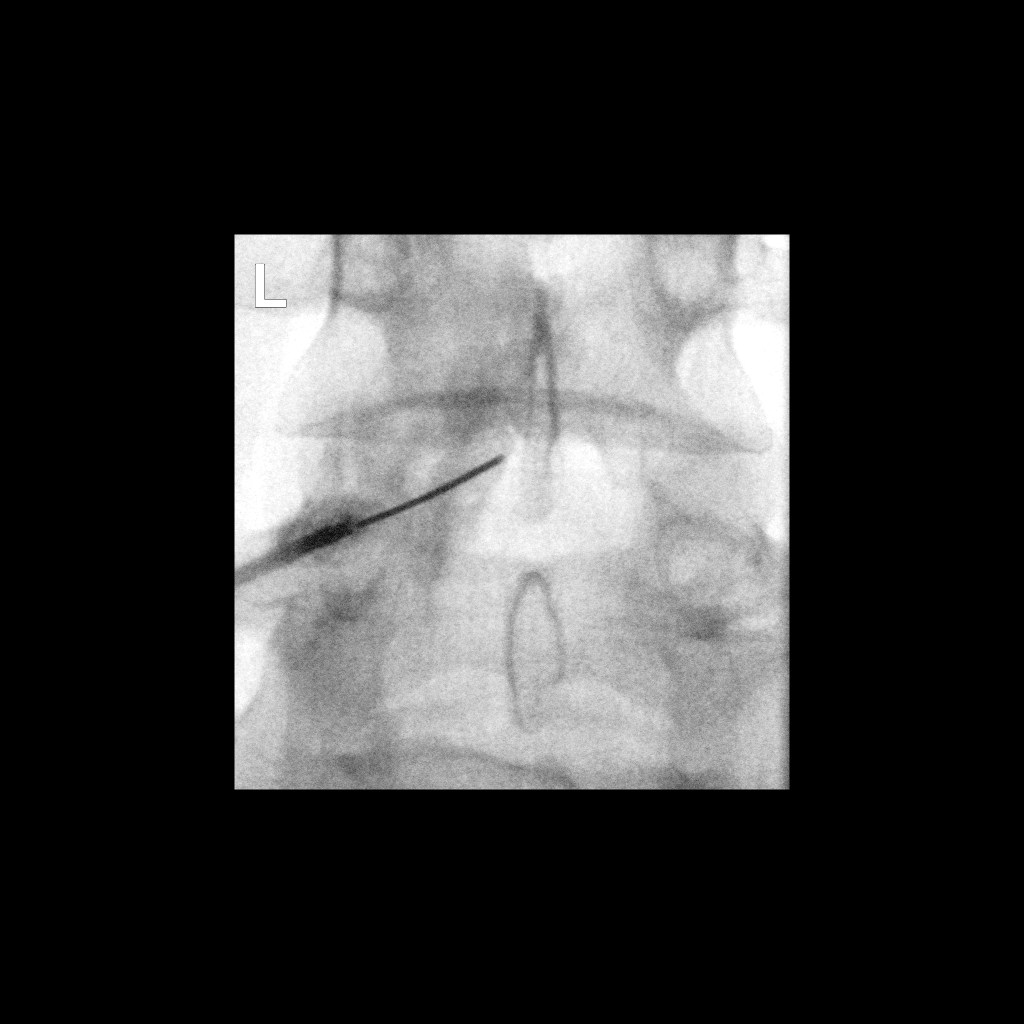

[2 of 2 positions shown; findings below may reference images not displayed]

FLUOROSCOPY TIME:  0 minutes 24 seconds. 28.24 micro gray meter
squared

PROCEDURE:
The procedure, risks, benefits, and alternatives were explained to
the patient. Questions regarding the procedure were encouraged and
answered. The patient understands and consents to the procedure.

LUMBAR EPIDURAL INJECTION:

An interlaminar approach was performed on the left at L4-5. The
overlying skin was cleansed and anesthetized. A 20 gauge epidural
needle was advanced using loss-of-resistance technique.

DIAGNOSTIC EPIDURAL INJECTION:

Injection of Isovue-M 200 shows a good epidural pattern with spread
above and below the level of needle placement, primarily on the
left. No vascular opacification is seen.

THERAPEUTIC EPIDURAL INJECTION:

One hundred twenty mg of Depo-Medrol mixed with 2 cc 1% lidocaine
were instilled. The procedure was well-tolerated, and the patient
was discharged thirty minutes following the injection in good
condition.

COMPLICATIONS:
None
IMPRESSION: Technically successful epidural injection on the left at L4-5 # 2.

As noted above, consider rheumatologic diagnoses including
seronegative spondyloarthropathies.

## 2020-01-01 MED ORDER — IOPAMIDOL (ISOVUE-M 200) INJECTION 41%
1.0000 mL | Freq: Once | INTRAMUSCULAR | Status: AC
  Administered 2020-01-01: 1 mL via EPIDURAL

## 2020-01-01 MED ORDER — METHYLPREDNISOLONE ACETATE 40 MG/ML INJ SUSP (RADIOLOG
120.0000 mg | Freq: Once | INTRAMUSCULAR | Status: AC
  Administered 2020-01-01: 120 mg via EPIDURAL

## 2020-01-01 NOTE — Discharge Instructions (Signed)

## 2020-01-02 ENCOUNTER — Other Ambulatory Visit: Payer: Self-pay

## 2020-01-02 ENCOUNTER — Other Ambulatory Visit (INDEPENDENT_AMBULATORY_CARE_PROVIDER_SITE_OTHER): Payer: PRIVATE HEALTH INSURANCE

## 2020-01-02 DIAGNOSIS — M2550 Pain in unspecified joint: Secondary | ICD-10-CM

## 2020-01-02 LAB — CBC WITH DIFFERENTIAL/PLATELET
Basophils Absolute: 0 10*3/uL (ref 0.0–0.1)
Basophils Relative: 0.1 % (ref 0.0–3.0)
Eosinophils Absolute: 0 10*3/uL (ref 0.0–0.7)
Eosinophils Relative: 0 % (ref 0.0–5.0)
HCT: 47.5 % (ref 39.0–52.0)
Hemoglobin: 15.1 g/dL (ref 13.0–17.0)
Lymphocytes Relative: 10.8 % — ABNORMAL LOW (ref 12.0–46.0)
Lymphs Abs: 1.4 10*3/uL (ref 0.7–4.0)
MCHC: 31.8 g/dL (ref 30.0–36.0)
MCV: 73.2 fl — ABNORMAL LOW (ref 78.0–100.0)
Monocytes Absolute: 0.4 10*3/uL (ref 0.1–1.0)
Monocytes Relative: 3.4 % (ref 3.0–12.0)
Neutro Abs: 11.1 10*3/uL — ABNORMAL HIGH (ref 1.4–7.7)
Neutrophils Relative %: 85.7 % — ABNORMAL HIGH (ref 43.0–77.0)
Platelets: 269 10*3/uL (ref 150.0–400.0)
RBC: 6.49 Mil/uL — ABNORMAL HIGH (ref 4.22–5.81)
RDW: 13.9 % (ref 11.5–15.5)
WBC: 12.9 10*3/uL — ABNORMAL HIGH (ref 4.0–10.5)

## 2020-01-02 LAB — COMPREHENSIVE METABOLIC PANEL
ALT: 33 U/L (ref 0–53)
AST: 16 U/L (ref 0–37)
Albumin: 4.9 g/dL (ref 3.5–5.2)
Alkaline Phosphatase: 57 U/L (ref 39–117)
BUN: 20 mg/dL (ref 6–23)
CO2: 27 mEq/L (ref 19–32)
Calcium: 9.7 mg/dL (ref 8.4–10.5)
Chloride: 103 mEq/L (ref 96–112)
Creatinine, Ser: 0.88 mg/dL (ref 0.40–1.50)
GFR: 107.6 mL/min (ref >60.00)
Glucose, Bld: 123 mg/dL — ABNORMAL HIGH (ref 70–99)
Potassium: 3.8 mEq/L (ref 3.5–5.1)
Sodium: 138 mEq/L (ref 135–145)
Total Bilirubin: 0.4 mg/dL (ref 0.2–1.2)
Total Protein: 8 g/dL (ref 6.0–8.3)

## 2020-01-02 LAB — SEDIMENTATION RATE: Sed Rate: 29 mm/hr — ABNORMAL HIGH (ref 0–15)

## 2020-01-02 LAB — IBC PANEL
Iron: 143 ug/dL (ref 42–165)
Saturation Ratios: 36.9 % (ref 20.0–50.0)
Transferrin: 277 mg/dL (ref 212.0–360.0)

## 2020-01-02 LAB — VITAMIN D 25 HYDROXY (VIT D DEFICIENCY, FRACTURES): VITD: 36.37 ng/mL (ref 30.00–100.00)

## 2020-01-02 LAB — TESTOSTERONE: Testosterone: 255.35 ng/dL — ABNORMAL LOW (ref 300.00–890.00)

## 2020-01-02 LAB — C-REACTIVE PROTEIN: CRP: <1 mg/dL (ref 0.5–20.0)

## 2020-01-02 LAB — URIC ACID: Uric Acid, Serum: 5.9 mg/dL (ref 4.0–7.8)

## 2020-01-02 LAB — TSH: TSH: 1.12 u[IU]/mL (ref 0.35–4.50)

## 2020-01-03 ENCOUNTER — Encounter: Payer: Self-pay | Admitting: Family Medicine

## 2020-01-03 MED ORDER — DOXYCYCLINE HYCLATE 100 MG PO TABS: 100.0000 mg | ORAL_TABLET | Freq: Two times a day (BID) | ORAL | 0 refills | Status: AC

## 2020-01-03 MED ORDER — PREDNISONE 20 MG PO TABS: 20.0000 mg | ORAL_TABLET | Freq: Every day | ORAL | 0 refills | Status: DC

## 2020-01-04 ENCOUNTER — Encounter: Payer: Self-pay | Admitting: Family Medicine

## 2020-01-04 LAB — ANA: Anti Nuclear Antibody (ANA): NEGATIVE

## 2020-01-04 LAB — EXTRA SPECIMEN

## 2020-01-04 LAB — PTH, INTACT AND CALCIUM
Calcium: 9.9 mg/dL (ref 8.6–10.3)
PTH: 25 pg/mL (ref 14–64)

## 2020-01-04 LAB — CALCIUM, IONIZED: Calcium, Ion: 5.1 mg/dL (ref 4.8–5.6)

## 2020-01-04 LAB — ANGIOTENSIN CONVERTING ENZYME: Angiotensin-Converting Enzyme: 15 U/L (ref 9–67)

## 2020-01-04 LAB — HLA-B27 ANTIGEN: HLA-B27 Antigen: NEGATIVE

## 2020-01-04 LAB — RHEUMATOID FACTOR: Rheumatoid fact SerPl-aCnc: <14 IU/mL (ref <14)

## 2020-01-04 LAB — CYCLIC CITRUL PEPTIDE ANTIBODY, IGG: Cyclic Citrullin Peptide Ab: <16 UNITS

## 2020-01-18 ENCOUNTER — Other Ambulatory Visit: Payer: Self-pay | Admitting: Family Medicine

## 2020-01-21 ENCOUNTER — Other Ambulatory Visit: Payer: Self-pay

## 2020-01-21 DIAGNOSIS — R102 Pelvic and perineal pain: Secondary | ICD-10-CM

## 2020-01-21 DIAGNOSIS — R1084 Generalized abdominal pain: Secondary | ICD-10-CM

## 2020-01-21 DIAGNOSIS — R109 Unspecified abdominal pain: Secondary | ICD-10-CM

## 2020-01-21 NOTE — Progress Notes (Unsigned)
CT

## 2020-01-24 ENCOUNTER — Other Ambulatory Visit: Payer: Self-pay | Admitting: Family Medicine

## 2020-02-06 ENCOUNTER — Other Ambulatory Visit: Payer: Self-pay | Admitting: Family Medicine

## 2020-02-06 ENCOUNTER — Ambulatory Visit
Admission: RE | Admit: 2020-02-06 | Discharge: 2020-02-06 | Disposition: A | Discharge status: Home / Self Care | Payer: PRIVATE HEALTH INSURANCE | Source: Ambulatory Visit | Attending: Family Medicine | Admitting: Family Medicine

## 2020-02-06 DIAGNOSIS — R102 Pelvic and perineal pain: Secondary | ICD-10-CM

## 2020-02-06 DIAGNOSIS — K50918 Crohn's disease, unspecified, with other complication: Secondary | ICD-10-CM

## 2020-02-06 IMAGING — CT CT ABD-PELV W/O CM
2 of 4 series · 11 of 46 positions shown, 12 images · non-contrast
Comparison: CT abdomen pelvis dated [DATE].

CLINICAL DATA: 39-year-old male with low back pain.

EXAM:
CT ABDOMEN AND PELVIS WITHOUT CONTRAST
TECHNIQUE: Multidetector CT imaging of the abdomen and pelvis was performed
following the standard protocol without IV contrast.

[Series 2: routine abdomen pelvis without 5.00 br40 s3 axial · axial · non-contrast · 0.57mm/px · z∈[+1214,+1679]mm · 8 of 111 slices shown, 9 images]
[im 9/111  soft-tissue]
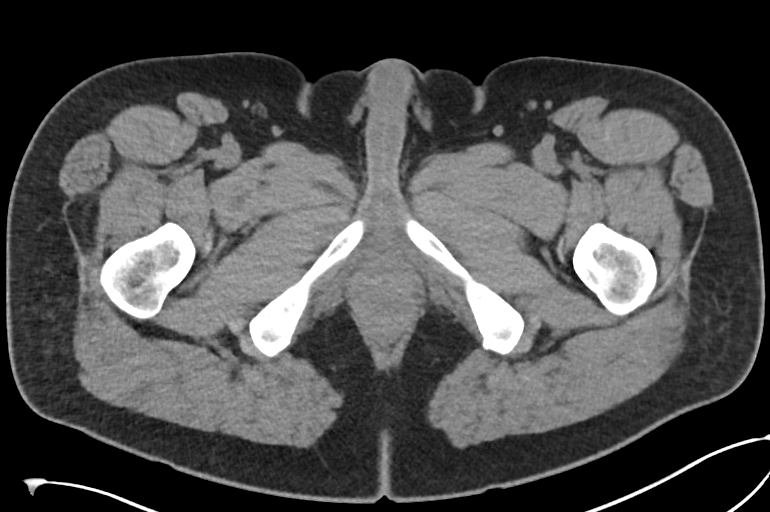
[im 9/111  bone]
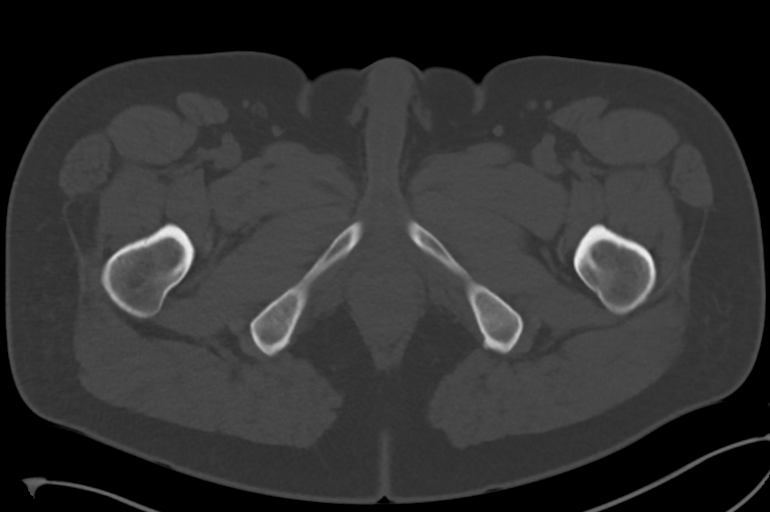
[im 23/111  soft-tissue]
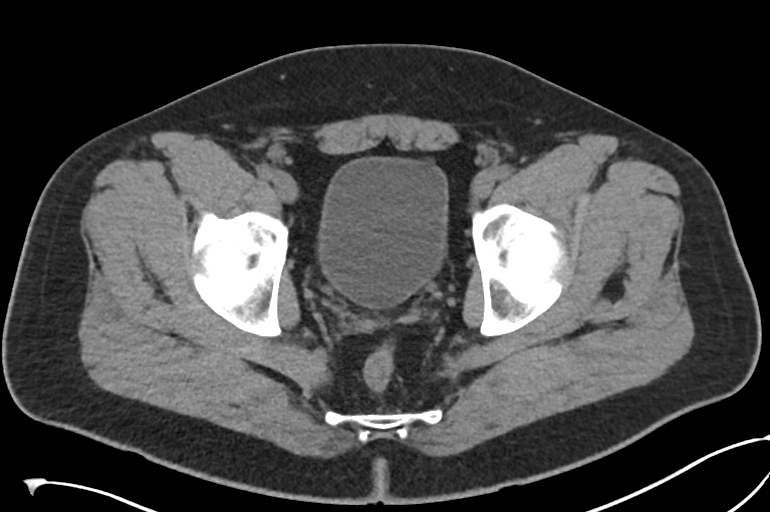
[im 36/111  soft-tissue]
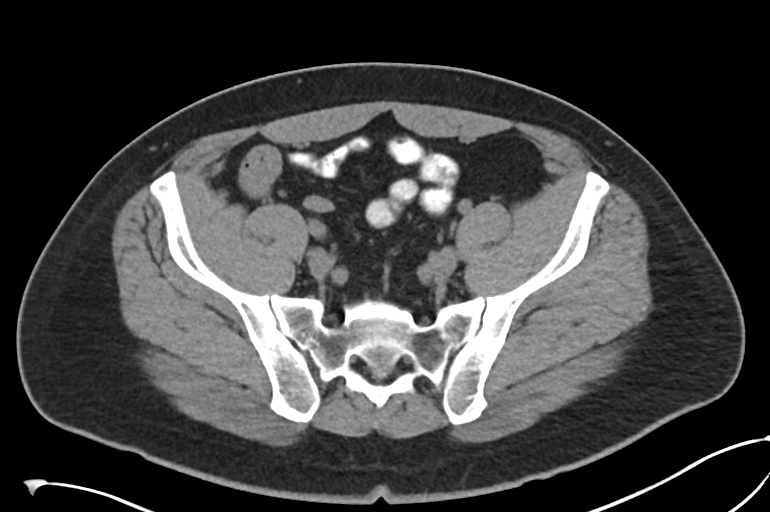
[im 49/111  soft-tissue]
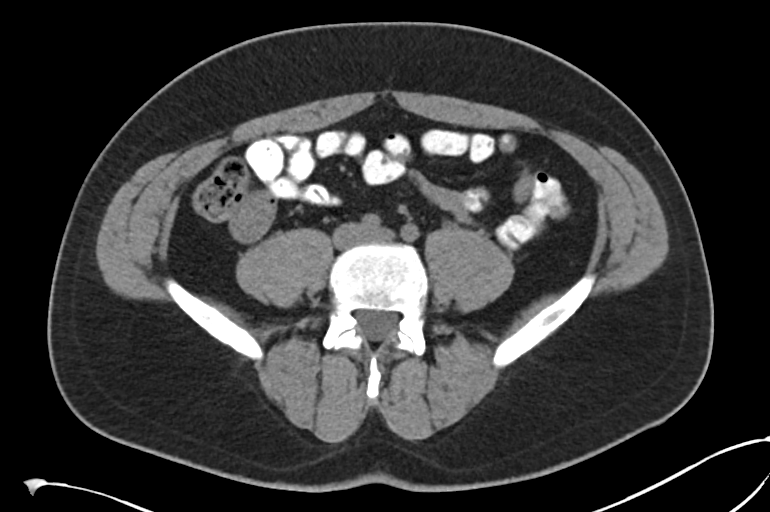
[im 62/111  soft-tissue]
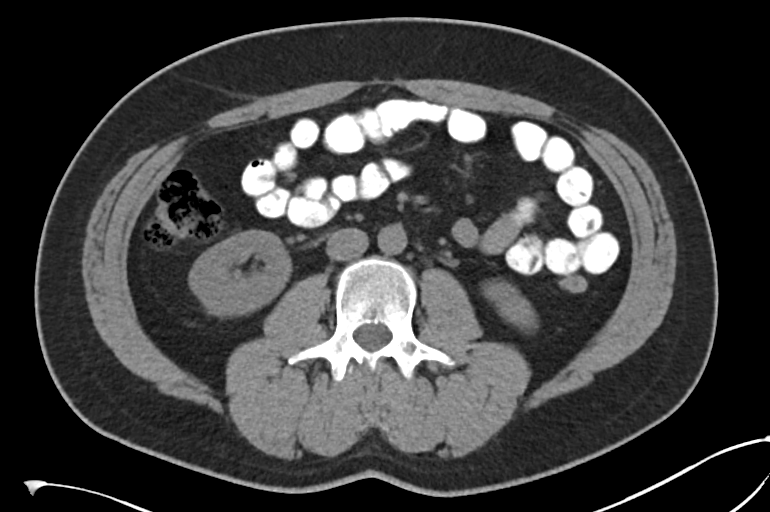
[im 75/111  soft-tissue]
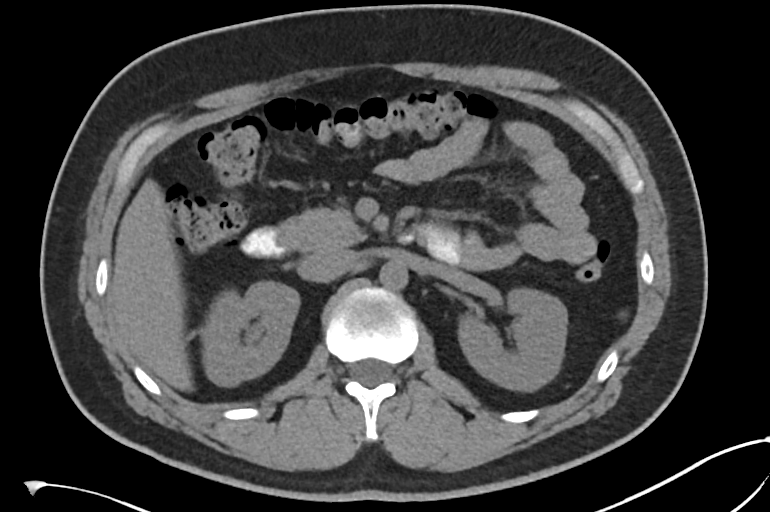
[im 89/111  soft-tissue]
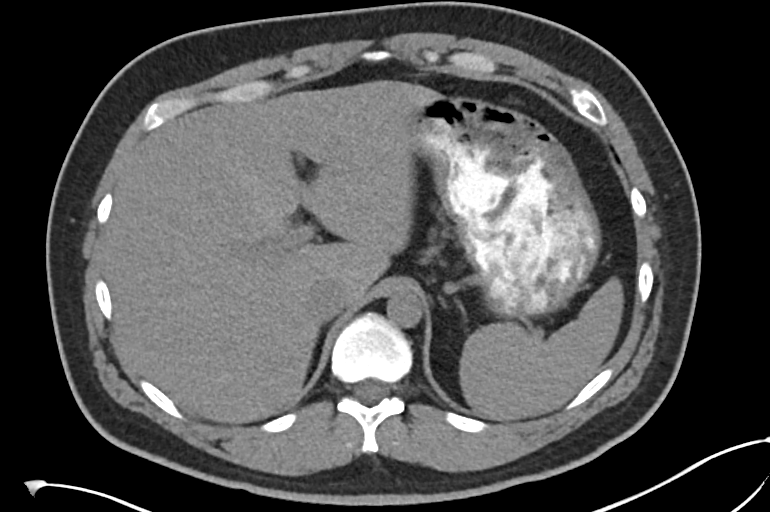
[im 102/111  soft-tissue]
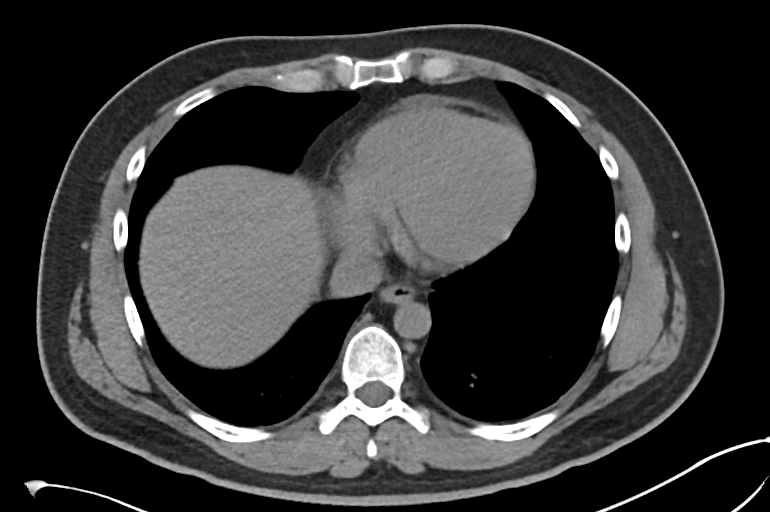

[Series 4: routine abdomen pelvis without 2.00 br40 s3 cor · coronal · non-contrast · 0.86mm/px · 3 of 146 slices shown]
[im 49/146  soft-tissue]
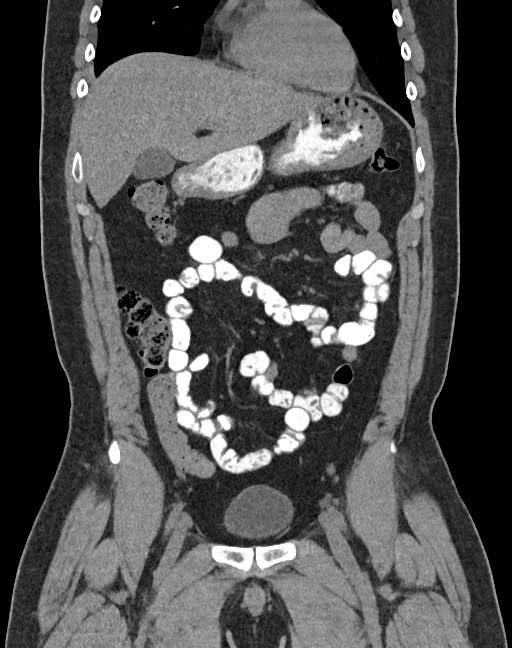
[im 65/146  soft-tissue]
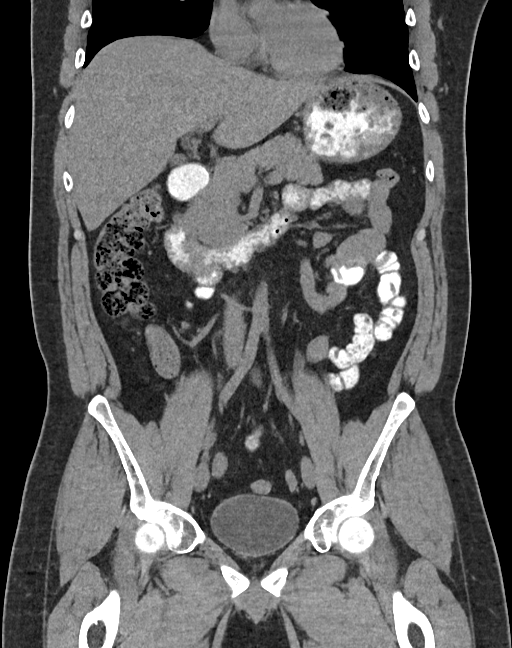
[im 81/146  soft-tissue]
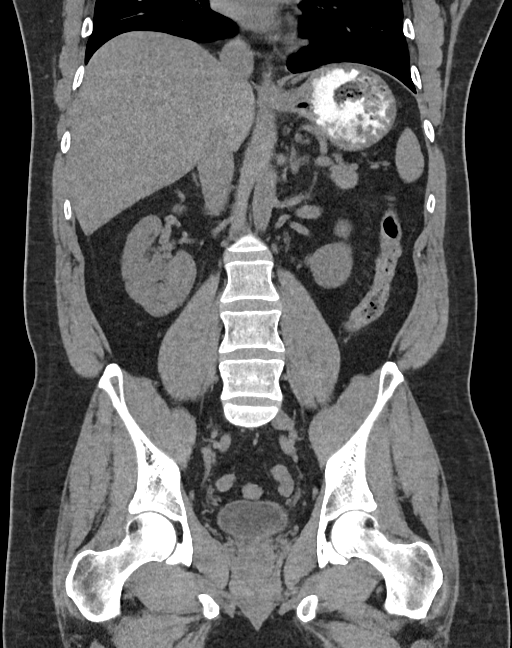

[11 of 46 positions shown; findings below may reference images not displayed]

FINDINGS: Evaluation of this exam is limited in the absence of intravenous
contrast.

Lower chest: The visualized lung bases are clear.

No intra-abdominal free air or free fluid.

Hepatobiliary: Diffuse fatty liver. No intrahepatic biliary
dilatation. The gallbladder is unremarkable.

Pancreas: Unremarkable. No pancreatic ductal dilatation or
surrounding inflammatory changes.

Spleen: Normal in size without focal abnormality.

Adrenals/Urinary Tract: The adrenal glands unremarkable. There is no
hydronephrosis or nephrolithiasis on either side. Slight lobulated
appearance of the inferior aspect of the left kidney. Further
evaluation with ultrasound is recommended. The visualized ureters
and urinary bladder appear unremarkable.

Stomach/Bowel: There is no bowel obstruction or active inflammation.
The appendix is normal.

Vascular/Lymphatic: The abdominal aorta and IVC unremarkable. No
portal venous gas. There is no adenopathy.

Reproductive: The prostate and seminal vesicles are grossly
unremarkable.

Other: None

Musculoskeletal: No acute or significant osseous findings.
IMPRESSION: 1. No acute intra-abdominal or pelvic pathology. No hydronephrosis
or nephrolithiasis.
2. Fatty liver.

## 2020-02-07 ENCOUNTER — Other Ambulatory Visit: Payer: Self-pay

## 2020-02-07 ENCOUNTER — Ambulatory Visit (INDEPENDENT_AMBULATORY_CARE_PROVIDER_SITE_OTHER): Payer: PRIVATE HEALTH INSURANCE | Admitting: Family Medicine

## 2020-02-07 ENCOUNTER — Encounter: Payer: Self-pay | Admitting: Family Medicine

## 2020-02-07 VITALS — BP 120/82 | HR 85 | Ht 76.0 in | Wt 245.0 lb

## 2020-02-07 DIAGNOSIS — M545 Low back pain, unspecified: Secondary | ICD-10-CM

## 2020-02-07 DIAGNOSIS — G8929 Other chronic pain: Secondary | ICD-10-CM | POA: Diagnosis not present

## 2020-02-07 DIAGNOSIS — M999 Biomechanical lesion, unspecified: Secondary | ICD-10-CM | POA: Diagnosis not present

## 2020-02-07 MED ORDER — TIZANIDINE HCL 2 MG PO TABS: 2.0000 mg | ORAL_TABLET | Freq: Every day | ORAL | 0 refills | Status: DC

## 2020-02-07 NOTE — Progress Notes (Signed)
Johnny [name] Phone: [phone] Subjective:   Johnny [name], am serving as a scribe for Dr. Hulan Saas. This visit occurred during the SARS-CoV-2 public health emergency.  Safety protocols were in place, including screening questions prior to the visit, additional usage of staff PPE, and extensive cleaning of exam room while observing appropriate contact time as indicated for disinfecting solutions.  I'm seeing this patient by the request  of:  Johnny Barrack, MD  CC: Low back pain follow-up  [id number] 12/27/2019 Low back pain.  Continues to be painful.  History of Crohn's disease.  Do not noted with patient's imaging very minimal findings at the L5-S1.  Patient did have some mild improvement with a epidural and will try that again.  We will start patient on Effexor and see how patient responds.  Patient of course wants to avoid anti-inflammatories secondary to Crohn's.  Does respond well to prednisone for the flares.  Patient will follow up with me again in 6 to 8 weeks.  Worsening symptoms will consider maybe a CT abdomen pelvis.  Update 02/07/2020 Johnny [name] is a 39 y.o. male coming in with complaint of LBP. Epidural 01/01/2020. Did have some relief but was also on prednsione at time of injection so is unsure what helped to reduce his pain at that time. States that he may be 30% better. Has good and bad days. Today having pain in lower back, midline.   CT Abdomen/pelvis 02/06/2020 IMPRESSION: 1. [name] acute intra-abdominal or pelvic pathology. [name] hydronephrosis or nephrolithiasis. 2. Fatty liver.  Patient's also had imaging including an MRI in May 2021 that did show a small central disc protrusion at L5-S1 without any significant stenosis.       Past Medical History:  Diagnosis Date  . Crohn's disease ([place])   . GERD (gastroesophageal reflux disease)   . Hyperlipidemia   . Hypertension    Past  Surgical History:  Procedure Laterality Date  . LIVER BIOPSY     Social History   Socioeconomic History  . Marital status: Married    Spouse name: Not on file  . Number of children: Not on file  . Years of education: Not on file  . Highest education level: Not on file  Occupational History  . Not on file  Tobacco Use  . Smoking status: Never Smoker  . Smokeless tobacco: Never Used  Vaping Use  . Vaping Use: Never used  Substance and Sexual Activity  . Alcohol use: [name]  . Drug use: [name]  . Sexual activity: Yes  Other Topics Concern  . Not on file  Social History Narrative  . Not on file   Social Determinants of Health   Financial Resource Strain: Not on file  Food Insecurity: Not on file  Transportation Needs: Not on file  Physical Activity: Not on file  Stress: Not on file  Social Connections: Not on file   Allergies  Allergen Reactions  . Other Diarrhea and Nausea And Vomiting    Champagne   . Codeine Nausea Only   [name] family history on file.  Current Outpatient Medications (Endocrine & Metabolic):  .  predniSONE (DELTASONE) 20 MG tablet, Take 1 tablet (20 mg total) by mouth daily with breakfast.  Current Outpatient Medications (Cardiovascular):  .  amLODipine (NORVASC) 5 MG tablet, Take 1 tablet (5 mg total) by mouth daily. Patient needs a follow up appt .  atorvastatin (LIPITOR) 10 MG tablet,  Take 1 tablet (10 mg total) by mouth daily at 6 PM.     Current Outpatient Medications (Other):  .  betamethasone valerate ointment (VALISONE) 0.1 %, Apply 1 application topically 2 (two) times daily. .  Cholecalciferol (VITAMIN D3) 2000 units TABS, Take by mouth. .  dexlansoprazole (DEXILANT) 60 MG capsule, Take by mouth. .  gabapentin (NEURONTIN) 400 MG capsule, TAKE 1 CAPSULE (400 MG TOTAL) BY MOUTH AT BEDTIME. .  mesalamine (LIALDA) 1.2 g EC tablet, Take by mouth. .  triamcinolone cream (KENALOG) 0.5 %, APPLY TO AFFECTED AREA TWICE A DAY .  venlafaxine XR  (EFFEXOR-XR) 37.5 MG 24 hr capsule, TAKE 1 CAPSULE BY MOUTH DAILY WITH BREAKFAST. [name] Kitchen  Vitamin D, Ergocalciferol, (DRISDOL) 1.25 MG (50000 UNIT) CAPS capsule, TAKE 1 CAPSULE (50,000 UNITS TOTAL) BY MOUTH EVERY 7 (SEVEN) DAYS. [name] Kitchen  tiZANidine (ZANAFLEX) 2 MG tablet, Take 1 tablet (2 mg total) by mouth at bedtime.   Reviewed prior external information including notes and imaging from  primary care provider As well as notes that were available from care everywhere and other healthcare systems.  Past medical history, social, surgical and family history all reviewed in electronic medical record.  [name] information unless stated regarding to the chief complaint.   Review of Systems:  [name], visual changes, nausea, vomiting, diarrhea, constipation, dizziness, abdominal pain, skin rash, fevers, chills, night sweats, weight loss, swollen lymph nodes, joint swelling, chest pain, shortness of breath, mood changes. POSITIVE muscle aches, body aches  Objective  Blood pressure 120/82, pulse 85, height 6' 4"  (1.93 m), weight 245 lb (111.1 kg), SpO2 96 %.   General: [name] apparent distress alert and oriented x3 mood and affect normal, dressed appropriately.  HEENT: Pupils equal, extraocular movements intact  Respiratory: Patient's speak in full sentences and does not appear short of breath  Cardiovascular: [name] lower extremity edema, non tender, [name] erythema  Neuro: Cranial nerves II through XII are intact, neurovascularly intact in all extremities with 2+ DTRs and 2+ pulses.  Gait normal with good balance and coordination.  MSK:  Non tender with full range of motion and good stability and symmetric strength and tone of shoulders, elbows, wrist, hip, knee and ankles bilaterally.  Low back exam does have some loss of lordosis.  Some tightness noted in paraspinal musculature.  Tightness with straight leg test but [name] radicular symptoms.  Patient has relatively good range of motion. Mild tightness with FABER  test.  Osteopathic findings  T7 extended rotated and side bent right  L1 flexed rotated and side bent right Sacrum right on right     Impression and Recommendations:     The above documentation has been reviewed and is accurate and complete [name], DO

## 2020-02-07 NOTE — Assessment & Plan Note (Addendum)
Patient had normal CT abdomen pelvis. Likely all muscular in nature with potential nerve root impingement but not confirmed with patient's previous MRI.  Patient did have some improvement with an epidural but was also on prednisone at the same time.  At this moment patient has failed all other conservative therapy including formal physical therapy.  Patient is on gabapentin. Increase Effexor to see how patient responds. Given some mild muscle relaxer if needed. Discussed posture and ergonomics. Patient does respond well he states to manipulation will continue this and follow-up again in 6 weeks. Chronic problem with exacerbation

## 2020-02-07 NOTE — Patient Instructions (Signed)
Good to see you Glad imaging was negative Increase effexor to 75 daily Zanaflex 2 mg as needed See me a gain in 6 weeks

## 2020-02-21 ENCOUNTER — Encounter: Payer: Self-pay | Admitting: Family Medicine

## 2020-02-29 ENCOUNTER — Other Ambulatory Visit: Payer: Self-pay | Admitting: Family Medicine

## 2020-03-15 ENCOUNTER — Other Ambulatory Visit: Payer: Self-pay | Admitting: Family Medicine

## 2020-03-20 ENCOUNTER — Other Ambulatory Visit: Payer: Self-pay

## 2020-03-20 ENCOUNTER — Encounter: Payer: Self-pay | Admitting: Family Medicine

## 2020-03-20 ENCOUNTER — Ambulatory Visit: Payer: PRIVATE HEALTH INSURANCE | Admitting: Family Medicine

## 2020-03-20 VITALS — BP 122/78 | HR 94 | Ht 76.0 in | Wt 239.0 lb

## 2020-03-20 DIAGNOSIS — M545 Low back pain, unspecified: Secondary | ICD-10-CM

## 2020-03-20 DIAGNOSIS — G8929 Other chronic pain: Secondary | ICD-10-CM

## 2020-03-20 DIAGNOSIS — M999 Biomechanical lesion, unspecified: Secondary | ICD-10-CM

## 2020-03-20 NOTE — Patient Instructions (Signed)
Good to see you Think we are making progress No changes in meds See me again in 6-8 weeks

## 2020-03-20 NOTE — Progress Notes (Signed)
[place] Phone: [phone] Subjective:   Johnny Johnny [name], am serving as a scribe for Dr. Hulan Johnny [name]. This visit occurred during the SARS-CoV-2 public health emergency.  Safety protocols were in place, including screening questions prior to the visit, additional usage of staff PPE, and extensive cleaning of exam room while observing appropriate contact time as indicated for disinfecting solutions.   I'm seeing this patient by the request  of:  Johnny Barrack, MD  CC: Low back pain follow-up  [id number]  Johnny Johnny [name] is a 40 y.o. male coming in with complaint of back and neck pain. OMT 02/07/2020. Patient states that he has not had any changes since last visit. Continues to use all medications below.  Patient states that if anything he has noticed some mild improvement.  Last time we did increase his Effexor to 75 mg.  Also using Zanaflex intermittently.  Patient states that he is making some improvement.  Patient continues to try to workout and is losing weight.  Feels like he is making some progress but very very slowly.  Medications patient has been prescribed: Gabapentin, Vit D, Effexor, Zanaflex  Taking: Yes         Reviewed prior external information including notes and imaging from previsou exam, outside providers and external EMR if available.   As well as notes that were available from care everywhere and other healthcare systems.  Past medical history, social, surgical and family history all reviewed in electronic medical record.  Johnny [name] information unless stated regarding to the chief complaint.   Past Medical History:  Diagnosis Date  . Crohn's disease ([place])   . GERD (gastroesophageal reflux disease)   . Hyperlipidemia   . Hypertension     Allergies  Allergen Reactions  . Other Diarrhea and Nausea And Vomiting    Champagne   . Codeine Nausea Only     Review of Systems:   Johnny [name], visual changes, nausea, vomiting, diarrhea, constipation, dizziness, abdominal pain, skin rash, fevers, chills, night sweats, weight loss, swollen lymph nodes, joint swelling, chest pain, shortness of breath, mood changes. POSITIVE muscle aches, body aches  Objective  Blood pressure 122/78, pulse 94, height 6' 4"  (1.93 m), weight 239 lb (108.4 kg), SpO2 96 %.   General: Johnny [name] apparent distress alert and oriented x3 mood and affect normal, dressed appropriately.  HEENT: Pupils equal, extraocular movements intact  Respiratory: Patient's speak in full sentences and does not appear short of breath  Cardiovascular: Johnny [name] lower extremity edema, non tender, Johnny [name] erythema  Gait normal with good balance and coordination.  MSK:  Non tender with full range of motion and good stability and symmetric strength and tone of shoulders, elbows, wrist, hip, knee and ankles bilaterally.  Back -low back still has tightness noted more in the thoracolumbar juncture right greater than left.  Patient also has tenderness noted in the lumbosacral area.  Negative straight leg test.  Osteopathic findings  T6 extended rotated and side bent right L1 flexed rotated and side bent right Sacrum right on right       Assessment and Plan:  Low back pain Patient with increasing Effexor is making some improvement.  Patient is also having the Zanaflex for breakthrough.  Patient is taking gabapentin still.  Patient has lost weight which I think is also been beneficial.  Encourage patient to continue with the conservative therapy at this time and follow-up with me again 6 to  8 weeks   Nonallopathic problems  Decision today to treat with OMT was based on Physical Exam  After verbal consent patient was treated with HVLA, ME, FPR techniques in  thoracic, lumbar, and sacral  areas  Patient tolerated the procedure well with improvement in symptoms  Patient given exercises, stretches and lifestyle modifications  See  medications in patient instructions if given  Patient will follow up in 6-8 weeks      The above documentation has been reviewed and is accurate and complete Johnny Pulley, DO       Note: This dictation was prepared with Dragon dictation along with smaller phrase technology. Any transcriptional errors that result from this process are unintentional.

## 2020-03-20 NOTE — Assessment & Plan Note (Signed)
Patient with increasing Effexor is making some improvement.  Patient is also having the Zanaflex for breakthrough.  Patient is taking gabapentin still.  Patient has lost weight which I think is also been beneficial.  Encourage patient to continue with the conservative therapy at this time and follow-up with me again 6 to 8 weeks

## 2020-03-24 MED ORDER — VENLAFAXINE HCL ER 75 MG PO CP24
75.0000 mg | ORAL_CAPSULE | Freq: Every day | ORAL | 1 refills | Status: DC
Start: 2020-03-24 — End: 2020-09-23

## 2020-04-15 ENCOUNTER — Other Ambulatory Visit: Payer: Self-pay | Admitting: Family Medicine

## 2020-05-14 NOTE — Progress Notes (Unsigned)
[place] Phone: [phone] Subjective:   Johnny Johnny [name], am serving as a scribe for Dr. Hulan Johnny [name]. This visit occurred during the SARS-CoV-2 public health emergency.  Safety protocols were in place, including screening questions prior to the visit, additional usage of staff PPE, and extensive cleaning of exam room while observing appropriate contact time as indicated for disinfecting solutions.   I'm seeing this patient by the request  of:  Johnny Barrack, MD  CC: back pain  [id number]  Johnny Johnny [name] is a 40 y.o. male coming in with complaint of back pain. OMT 03/20/2020. Patient states that he is having tingling sensation in lateral part of B upper legs. Walking around helps alleviate pain. Doing stretches and using inversion table.   Medications patient has been prescribed: None          Reviewed prior external information including notes and imaging from previsou exam, outside providers and external EMR if available.   As well as notes that were available from care everywhere and other healthcare systems.  Past medical history, social, surgical and family history all reviewed in electronic medical record.  Johnny [name] information unless stated regarding to the chief complaint.   Past Medical History:  Diagnosis Date  . Crohn's disease (Johnny [name])   . GERD (gastroesophageal reflux disease)   . Hyperlipidemia   . Hypertension     Allergies  Allergen Reactions  . Other Diarrhea and Nausea And Vomiting    Champagne   . Codeine Nausea Only     Review of Systems:  Johnny [name], visual changes, nausea, vomiting, diarrhea, constipation, dizziness, abdominal pain, skin rash, fevers, chills, night sweats, weight loss, swollen lymph nodes, body aches, joint swelling, chest pain, shortness of breath, mood changes. POSITIVE muscle aches  Objective  Blood pressure (!) 132/92, pulse 97, height 6' 4"   (1.93 m), weight 239 lb (108.4 kg), SpO2 97 %.   General: Johnny [name] apparent distress alert and oriented x3 mood and affect normal, dressed appropriately.  HEENT: Pupils equal, extraocular movements intact  Respiratory: Patient's speak in full sentences and does not appear short of breath  Cardiovascular: Johnny [name] lower extremity edema, non tender, Johnny [name] erythema  Gait normal with good balance and coordination.  MSK:  Non tender with full range of motion and good stability and symmetric strength and tone of shoulders, elbows, wrist, hip, knee and ankles bilaterally.  Back -   Osteopathic findings   T6 extended rotated and side bent left L3 flexed rotated and side bent right Sacrum right on right       Assessment and Plan:  Low back pain Patient is doing very well with the medications.  Patient seems to respond very well to the Effexor.  Johnny [name] change in the medication at this time.  Disinterested.  Patient has improved his core strength.  Patient will follow up with me again in 8 to 10 weeks    Nonallopathic problems  Decision today to treat with OMT was based on Physical Exam  After verbal consent patient was treated with HVLA, ME, FPR techniques in  thoracic, lumbar, and sacral  areas  Patient tolerated the procedure well with improvement in symptoms  Patient given exercises, stretches and lifestyle modifications  See medications in patient instructions if given  Patient will follow up in 6-8 weeks      The above documentation has been reviewed and is accurate and complete Johnny Pulley, DO  Note: This dictation was prepared with Dragon dictation along with smaller phrase technology. Any transcriptional errors that result from this process are unintentional.

## 2020-05-15 ENCOUNTER — Ambulatory Visit: Payer: PRIVATE HEALTH INSURANCE | Admitting: Family Medicine

## 2020-05-15 ENCOUNTER — Other Ambulatory Visit: Payer: Self-pay

## 2020-05-15 ENCOUNTER — Encounter: Payer: Self-pay | Admitting: Family Medicine

## 2020-05-15 VITALS — BP 132/92 | HR 97 | Ht 76.0 in | Wt 239.0 lb

## 2020-05-15 DIAGNOSIS — G8929 Other chronic pain: Secondary | ICD-10-CM | POA: Diagnosis not present

## 2020-05-15 DIAGNOSIS — M545 Low back pain, unspecified: Secondary | ICD-10-CM | POA: Diagnosis not present

## 2020-05-15 DIAGNOSIS — M999 Biomechanical lesion, unspecified: Secondary | ICD-10-CM | POA: Diagnosis not present

## 2020-05-15 NOTE — Patient Instructions (Signed)
Best you have been See me again in 8-9 months

## 2020-05-15 NOTE — Assessment & Plan Note (Signed)
Patient is doing very well with the medications.  Patient seems to respond very well to the Effexor.  No change in the medication at this time.  Disinterested.  Patient has improved his core strength.  Patient will follow up with me again in 8 to 10 weeks

## 2020-07-03 ENCOUNTER — Encounter: Payer: Self-pay | Admitting: Family Medicine

## 2020-07-03 NOTE — Progress Notes (Deleted)
  [place] Phone: [phone] Subjective:    I'm seeing this patient by the request  of:  Vivi Barrack, MD  CC:   [id number]  AARIZ MAISH is a 40 y.o. male coming in with complaint of back and neck pain. OMT 05/15/2020. Patient states   Medications patient has been prescribed: Zanaflex, Effexor  Taking:         Reviewed prior external information including notes and imaging from previsou exam, outside providers and external EMR if available.   As well as notes that were available from care everywhere and other healthcare systems.  Past medical history, social, surgical and family history all reviewed in electronic medical record.  No pertanent information unless stated regarding to the chief complaint.   Past Medical History:  Diagnosis Date  . Crohn's disease ([place])   . GERD (gastroesophageal reflux disease)   . Hyperlipidemia   . Hypertension     Allergies  Allergen Reactions  . Other Diarrhea and Nausea And Vomiting    Champagne   . Codeine Nausea Only     Review of Systems:  No headache, visual changes, nausea, vomiting, diarrhea, constipation, dizziness, abdominal pain, skin rash, fevers, chills, night sweats, weight loss, swollen lymph nodes, body aches, joint swelling, chest pain, shortness of breath, mood changes. POSITIVE muscle aches  Objective  There were no vitals taken for this visit.   General: No apparent distress alert and oriented x3 mood and affect normal, dressed appropriately.  HEENT: Pupils equal, extraocular movements intact  Respiratory: Patient's speak in full sentences and does not appear short of breath  Cardiovascular: No lower extremity edema, non tender, no erythema  Neuro: Cranial nerves II through XII are intact, neurovascularly intact in all extremities with 2+ DTRs and 2+ pulses.  Gait normal with good balance and coordination.  MSK:  Non tender with  full range of motion and good stability and symmetric strength and tone of shoulders, elbows, wrist, hip, knee and ankles bilaterally.  Back - Normal skin, Spine with normal alignment and no deformity.  No tenderness to vertebral process palpation.  Paraspinous muscles are not tender and without spasm.   Range of motion is full at neck and lumbar sacral regions  Osteopathic findings  C2 flexed rotated and side bent right C6 flexed rotated and side bent left T3 extended rotated and side bent right inhaled rib T9 extended rotated and side bent left L2 flexed rotated and side bent right Sacrum right on right       Assessment and Plan:    Nonallopathic problems  Decision today to treat with OMT was based on Physical Exam  After verbal consent patient was treated with HVLA, ME, FPR techniques in cervical, rib, thoracic, lumbar, and sacral  areas  Patient tolerated the procedure well with improvement in symptoms  Patient given exercises, stretches and lifestyle modifications  See medications in patient instructions if given  Patient will follow up in 4-8 weeks      The above documentation has been reviewed and is accurate and complete [name]       Note: This dictation was prepared with Dragon dictation along with smaller phrase technology. Any transcriptional errors that result from this process are unintentional.

## 2020-07-06 ENCOUNTER — Encounter: Payer: Self-pay | Admitting: Physician Assistant

## 2020-07-06 ENCOUNTER — Telehealth: Payer: PRIVATE HEALTH INSURANCE | Admitting: Physician Assistant

## 2020-07-06 DIAGNOSIS — U071 COVID-19: Secondary | ICD-10-CM | POA: Diagnosis not present

## 2020-07-06 MED ORDER — BENZONATATE 100 MG PO CAPS: 100.0000 mg | ORAL_CAPSULE | Freq: Three times a day (TID) | ORAL | 0 refills | Status: DC | PRN

## 2020-07-06 NOTE — Progress Notes (Signed)
[name], Johnny [name] are scheduled for a virtual visit with your provider today.    Just as we do with appointments in the office, we must obtain your consent to participate.  Your consent will be active for this visit and any virtual visit you may have with one of our providers in the next 365 days.    If you have a MyChart account, I can also send a copy of this consent to you electronically.  All virtual visits are billed to your insurance company just like a traditional visit in the office.  As this is a virtual visit, video technology does not allow for your provider to perform a traditional examination.  This may limit your provider's ability to fully assess your condition.  If your provider identifies any concerns that need to be evaluated in person or the need to arrange testing such as labs, EKG, etc, we will make arrangements to do so.    Although advances in technology are sophisticated, we cannot ensure that it will always work on either your end or our end.  If the connection with a video visit is poor, we may have to switch to a telephone visit.  With either a video or telephone visit, we are not always able to ensure that we have a secure connection.   I need to obtain your verbal consent now.   Are you willing to proceed with your visit today?   [name] has provided verbal consent on 07/06/2020 for a virtual visit (video or telephone).  Leeanne Rio, PA-C 07/06/2020  7:46 PM  Virtual Visit via Video   I connected with patient on 07/06/20 at  7:45 PM EDT by a video enabled telemedicine application and verified that I am speaking with the correct person using two identifiers.  Location patient: Home Location provider: [place] participating in the virtual visit: Patient, Provider  I discussed the limitations of evaluation and management by telemedicine and the availability of in person appointments. The patient expressed understanding and agreed to  proceed.  Subjective:   HPI:  Patient presents via [place] today after testing + for COVID on 2 rapid home tests. States this morning he felt just fine, taking his children on a bike ride. States a few hours after being home he noted quite significant fatigue with onset of fever (Tamx 100.3), aches and chills. Denies any URI symptoms, loss of taste or smell, or windedness. Everyone else at home feels fine so far. Wife had negative test this evening. Denies any known exposure to COVID. Notes he has had his vaccine series giving history of hypertension, IBD and higher risk of complications from infection. Has not taken anything for symptoms as of this visit.   ROS:   See pertinent positives and negatives per HPI.  Patient Active Problem List   Diagnosis Date Noted  . Nonallopathic lesion of sacral region 08/24/2018  . Nonallopathic lesion of lumbosacral region 08/24/2018  . Nonallopathic lesion of thoracic region 08/24/2018  . Low back pain 07/27/2018  . Vitamin D deficiency 11/29/2017  . Prediabetes 12/24/2016  . Tinnitus 12/24/2016  . Crohn's disease ([place]) 06/11/2016  . Eczema 06/11/2016  . Fatty liver 06/11/2016  . GERD (gastroesophageal reflux disease) 06/11/2016  . IBS (irritable bowel syndrome) 06/11/2016  . Vitamin B12 deficiency 06/11/2016  . Dyslipidemia 05/25/2014  . Benign essential hypertension 10/01/2013  . Metabolic syndrome 16/11/9602    Social History   Tobacco Use  . Smoking status:  Never Smoker  . Smokeless tobacco: Never Used  Substance Use Topics  . Alcohol use: No    Current Outpatient Medications:  .  amLODipine (NORVASC) 5 MG tablet, Take 1 tablet (5 mg total) by mouth daily. Patient needs a follow up appt, Disp: 90 tablet, Rfl: 3 .  atorvastatin (LIPITOR) 10 MG tablet, Take 1 tablet (10 mg total) by mouth daily at 6 PM., Disp: 90 tablet, Rfl: 3 .  betamethasone valerate ointment (VALISONE) 0.1 %, Apply 1 application topically 2 (two) times daily.,  Disp: 30 g, Rfl: 0 .  Cholecalciferol (VITAMIN D3) 2000 units TABS, Take by mouth., Disp: , Rfl:  .  dexlansoprazole (DEXILANT) 60 MG capsule, Take by mouth., Disp: , Rfl:  .  gabapentin (NEURONTIN) 400 MG capsule, TAKE 1 CAPSULE (400 MG TOTAL) BY MOUTH AT BEDTIME., Disp: 90 capsule, Rfl: 1 .  mesalamine (LIALDA) 1.2 g EC tablet, Take by mouth., Disp: , Rfl:  .  predniSONE (DELTASONE) 20 MG tablet, Take 1 tablet (20 mg total) by mouth daily with breakfast., Disp: 5 tablet, Rfl: 0 .  tiZANidine (ZANAFLEX) 2 MG tablet, TAKE 1 TABLET BY MOUTH EVERYDAY AT BEDTIME, Disp: 90 tablet, Rfl: 1 .  triamcinolone cream (KENALOG) 0.5 %, APPLY TO AFFECTED AREA TWICE A DAY, Disp: 30 g, Rfl: 1 .  venlafaxine XR (EFFEXOR-XR) 75 MG 24 hr capsule, Take 1 capsule (75 mg total) by mouth daily with breakfast., Disp: 90 capsule, Rfl: 1 .  Vitamin D, Ergocalciferol, (DRISDOL) 1.25 MG (50000 UNIT) CAPS capsule, TAKE 1 CAPSULE (50,000 UNITS TOTAL) BY MOUTH EVERY 7 (SEVEN) DAYS., Disp: 12 capsule, Rfl: 0  Allergies  Allergen Reactions  . Other Diarrhea and Nausea And Vomiting    Champagne   . Codeine Nausea Only    Objective:   There were no vitals taken for this visit.  Patient is well-developed, well-nourished in no acute distress.  Resting comfortably at home.  Head is normocephalic, atraumatic.  No labored breathing.  Speech is clear and coherent with logical content.  Patient is alert and oriented at baseline.   Assessment and Plan:      Leeanne Rio, PA-C 07/06/2020

## 2020-07-06 NOTE — Patient Instructions (Signed)
Instructions sent to MyChart

## 2020-07-07 ENCOUNTER — Encounter: Payer: Self-pay | Admitting: Oncology

## 2020-07-07 ENCOUNTER — Telehealth: Payer: Self-pay | Admitting: Oncology

## 2020-07-07 ENCOUNTER — Telehealth: Payer: Self-pay | Admitting: Unknown Physician Specialty

## 2020-07-07 MED ORDER — NIRMATRELVIR/RITONAVIR (PAXLOVID)TABLET: 3.0000 | ORAL_TABLET | Freq: Two times a day (BID) | ORAL | 0 refills | Status: AC

## 2020-07-07 NOTE — Telephone Encounter (Signed)
Called to discuss with patient about COVID-19 symptoms and the use of one of the available treatments for those with mild to moderate Covid symptoms and at a high risk of hospitalization.  Pt appears to qualify for outpatient treatment due to co-morbid conditions and/or a member of an at-risk group in accordance with the FDA Emergency Use Authorization.    Symptom onset: 07/06/20 Vaccinated: Yes Booster? yes Immunocompromised? No Qualifiers:  Past Medical History:  Diagnosis Date  . Crohn's disease ([place])   . GERD (gastroesophageal reflux disease)   . Hyperlipidemia   . Hypertension     NIH Criteria: Tier 4  Unable to reach pt - Left VM and sent My chart message  Jacquelin Hawking

## 2020-07-07 NOTE — Telephone Encounter (Signed)
Outpatient Oral COVID Treatment Note  I connected with Johnny [name] on 07/07/2020/3:36 PM by telephone and verified that I am speaking with the correct person using two identifiers.  I discussed the limitations, risks, security, and privacy concerns of performing an evaluation and management service by telephone and the availability of in person appointments. I also discussed with the patient that there may be a patient responsible charge related to this service. The patient expressed understanding and agreed to proceed.  Patient location: home Provider location: home  Diagnosis: COVID-19 infection  Purpose of visit: Discussion of potential use of Molnupiravir or Paxlovid, a new treatment for mild to moderate COVID-19 viral infection in non-hospitalized patients.   Subjective: Patient is a 40 y.o. male who has been diagnosed with COVID 19 viral infection.  Their symptoms began on 5/15 with fatigue and dizzyness.    Past Medical History:  Diagnosis Date  . Crohn's disease ([place])   . GERD (gastroesophageal reflux disease)   . Hyperlipidemia   . Hypertension     Allergies  Allergen Reactions  . Other Diarrhea and Nausea And Vomiting    Champagne   . Codeine Nausea Only     Current Outpatient Medications:  .  amLODipine (NORVASC) 5 MG tablet, Take 1 tablet (5 mg total) by mouth daily. Patient needs a follow up appt, Disp: 90 tablet, Rfl: 3 .  benzonatate (TESSALON) 100 MG capsule, Take 1 capsule (100 mg total) by mouth 3 (three) times daily as needed for cough., Disp: 30 capsule, Rfl: 0 .  betamethasone valerate ointment (VALISONE) 0.1 %, Apply 1 application topically 2 (two) times daily., Disp: 30 g, Rfl: 0 .  gabapentin (NEURONTIN) 400 MG capsule, TAKE 1 CAPSULE (400 MG TOTAL) BY MOUTH AT BEDTIME., Disp: 90 capsule, Rfl: 1 .  mesalamine (LIALDA) 1.2 g EC tablet, Take by mouth., Disp: , Rfl:  .  omeprazole (PRILOSEC) 40 MG capsule, Take 1 capsule by mouth every morning., Disp: , Rfl:   .  venlafaxine XR (EFFEXOR-XR) 75 MG 24 hr capsule, Take 1 capsule (75 mg total) by mouth daily with breakfast., Disp: 90 capsule, Rfl: 1 .  Vitamin D, Ergocalciferol, (DRISDOL) 1.25 MG (50000 UNIT) CAPS capsule, TAKE 1 CAPSULE (50,000 UNITS TOTAL) BY MOUTH EVERY 7 (SEVEN) DAYS., Disp: 12 capsule, Rfl: 0  Objective: Patient appears/sounds well.  They are in no apparent distress.  Breathing is non labored.  Mood and behavior are normal.  Laboratory Data:  No results found for this or any previous visit (from the past 2160 hour(s)).   Assessment: 40 y.o. male with mild/moderate COVID 19 viral infection diagnosed on 5/15 at high risk for progression to severe COVID 19.  Plan:  This patient is a 40 y.o. male that meets the following criteria for Emergency Use Authorization of: Paxlovid 1. Age >12 yr AND > 40 kg 2. SARS-COV-2 positive test 3. Symptom onset < 5 days 4. Mild-to-moderate COVID disease with high risk for severe progression to hospitalization or death  I have spoken and communicated the following to the patient or parent/caregiver regarding: 1. Paxlovid is an unapproved drug that is authorized for use under an Emergency Use Authorization.  2. There are no adequate, approved, available products for the treatment of COVID-19 in adults who have mild-to-moderate COVID-19 and are at high risk for progressing to severe COVID-19, including hospitalization or death. 3. Other therapeutics are currently authorized. For additional information on all products authorized for treatment or prevention of COVID-19, please see [email or web address].  4. There are benefits and risks of taking this treatment as outlined in the "Fact Sheet for Patients and Caregivers."  5. "Fact Sheet for Patients and Caregivers" was reviewed with patient. A hard copy will be provided to patient from pharmacy prior to the  patient receiving treatment. 6. Patients should continue to self-isolate and use infection control measures (e.g., wear mask, isolate, social distance, avoid sharing personal items, clean and disinfect "high touch" surfaces, and frequent handwashing) according to CDC guidelines.  7. The patient or parent/caregiver has the option to accept or refuse treatment. 8. Patient medication history was reviewed for potential drug interactions:Interaction with home meds: Amlodipine which he will break in 1/2 9. Patient's GFR was calculated to be >100, and they were therefore prescribed Normal dose (GFR>60) - nirmatrelvir 138m tab (2 tablet) by mouth twice daily AND ritonavir 1025mtab (1 tablet) by mouth twice daily   After reviewing above information with the patient, the patient agrees to receive Paxlovid.  Follow up instructions:    . Take prescription BID x 5 days as directed . Reach out to pharmacist for counseling on medication if desired . For concerns regarding further COVID symptoms please follow up with your PCP or urgent care . For urgent or life-threatening issues, seek care at your local emergency department  The patient was provided an opportunity to ask questions, and all were answered. The patient agreed with the plan and demonstrated an understanding of the instructions.   Script sent to [facility]  The patient was advised to call their PCP or seek an in-person evaluation if the symptoms worsen or if the condition fails to improve as anticipated.   I provided 20 minutes of non face-to-face telephone visit time during this encounter, and > 50% was spent counseling as documented under my assessment & plan.  ChKathrine HaddockNP 07/07/2020 /3:36 PM

## 2020-07-10 ENCOUNTER — Ambulatory Visit: Payer: PRIVATE HEALTH INSURANCE | Admitting: Family Medicine

## 2020-07-29 ENCOUNTER — Ambulatory Visit (INDEPENDENT_AMBULATORY_CARE_PROVIDER_SITE_OTHER): Payer: PRIVATE HEALTH INSURANCE

## 2020-07-29 ENCOUNTER — Encounter: Payer: Self-pay | Admitting: Family Medicine

## 2020-07-29 ENCOUNTER — Other Ambulatory Visit: Payer: Self-pay

## 2020-07-29 ENCOUNTER — Ambulatory Visit: Payer: PRIVATE HEALTH INSURANCE | Admitting: Family Medicine

## 2020-07-29 VITALS — BP 140/90 | HR 79 | Ht 76.0 in | Wt 246.0 lb

## 2020-07-29 DIAGNOSIS — M9904 Segmental and somatic dysfunction of sacral region: Secondary | ICD-10-CM

## 2020-07-29 DIAGNOSIS — M9903 Segmental and somatic dysfunction of lumbar region: Secondary | ICD-10-CM | POA: Diagnosis not present

## 2020-07-29 DIAGNOSIS — G8929 Other chronic pain: Secondary | ICD-10-CM

## 2020-07-29 DIAGNOSIS — M9902 Segmental and somatic dysfunction of thoracic region: Secondary | ICD-10-CM

## 2020-07-29 DIAGNOSIS — M545 Low back pain, unspecified: Secondary | ICD-10-CM | POA: Diagnosis not present

## 2020-07-29 IMAGING — DX DG LUMBAR SPINE COMPLETE 4+V
5 series · 5 of 5 positions shown · non-contrast
Comparison: CT [DATE]

CLINICAL DATA: Low back pain

EXAM:
LUMBAR SPINE - COMPLETE 4+ VIEW

[l-spine ap]
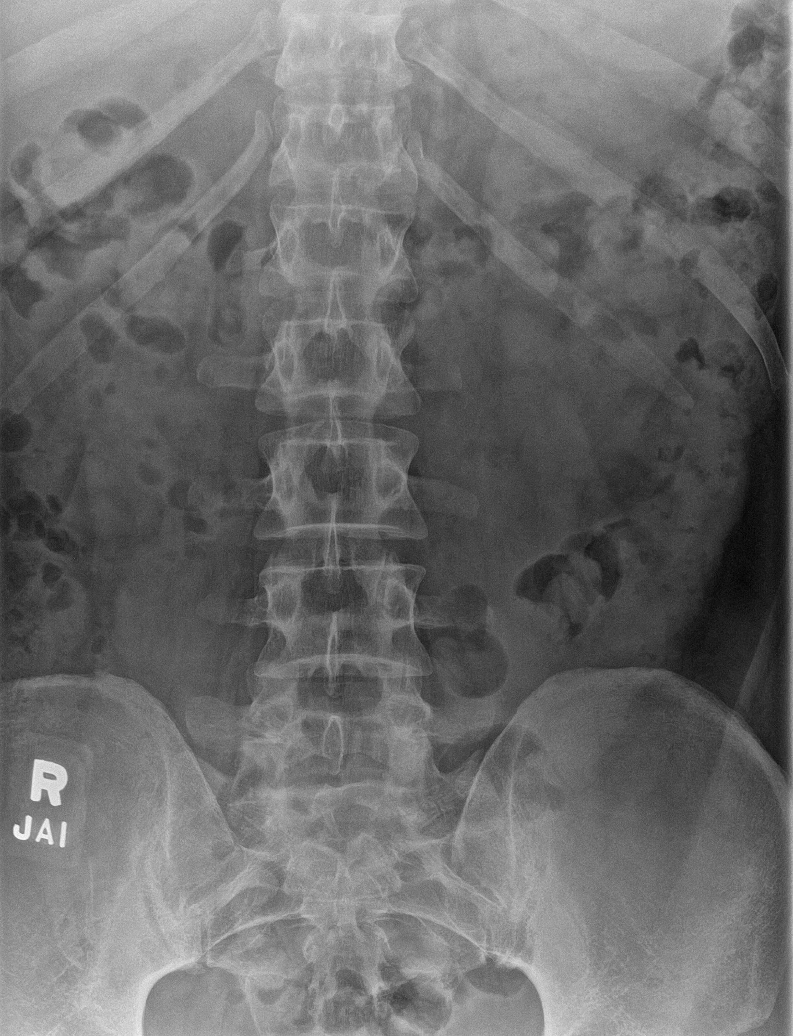

[l-spine obl (1 of 2)]
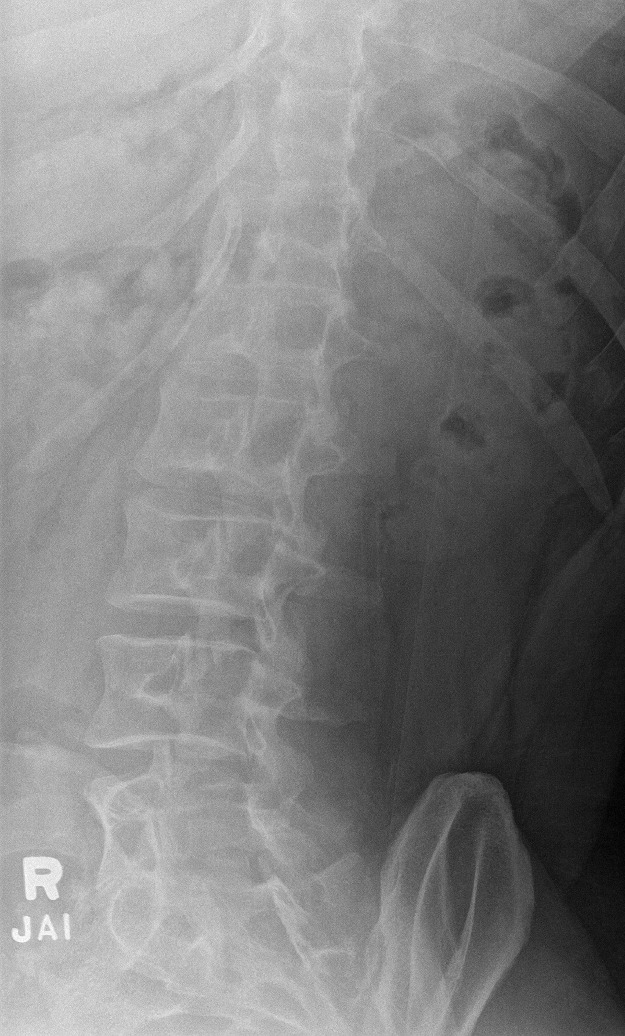

[l-spine obl (2 of 2)]
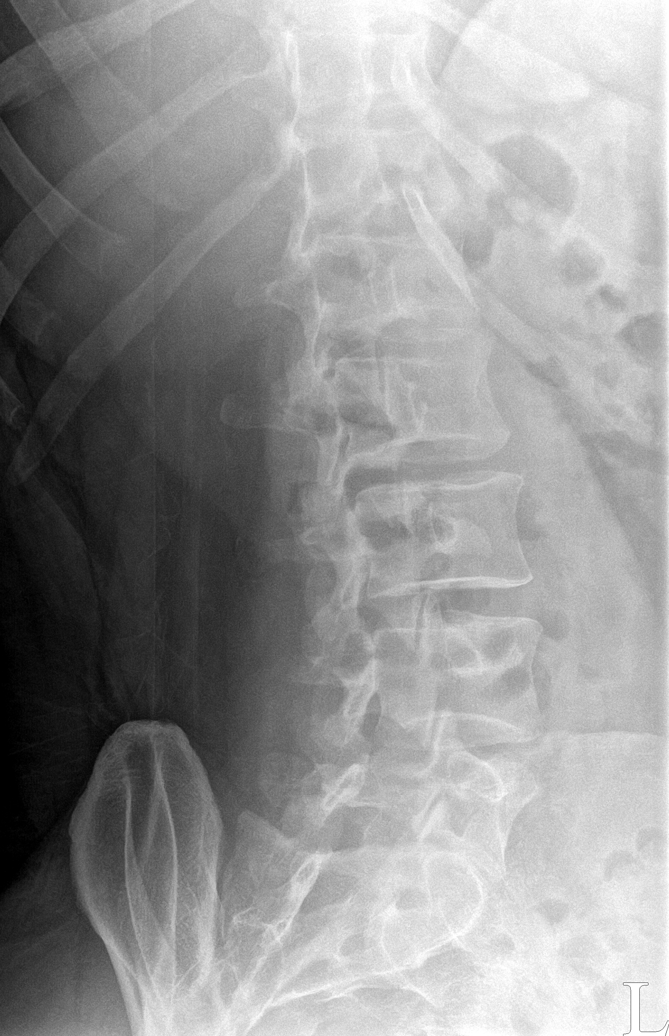

[l-spine lateral]
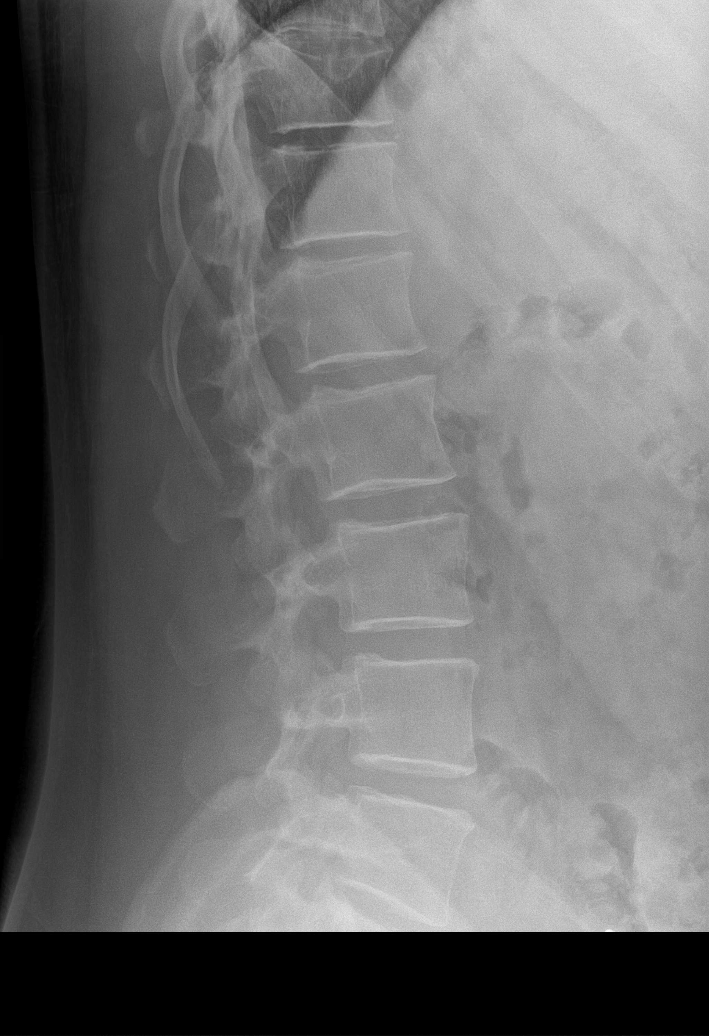

[l-spine spot]
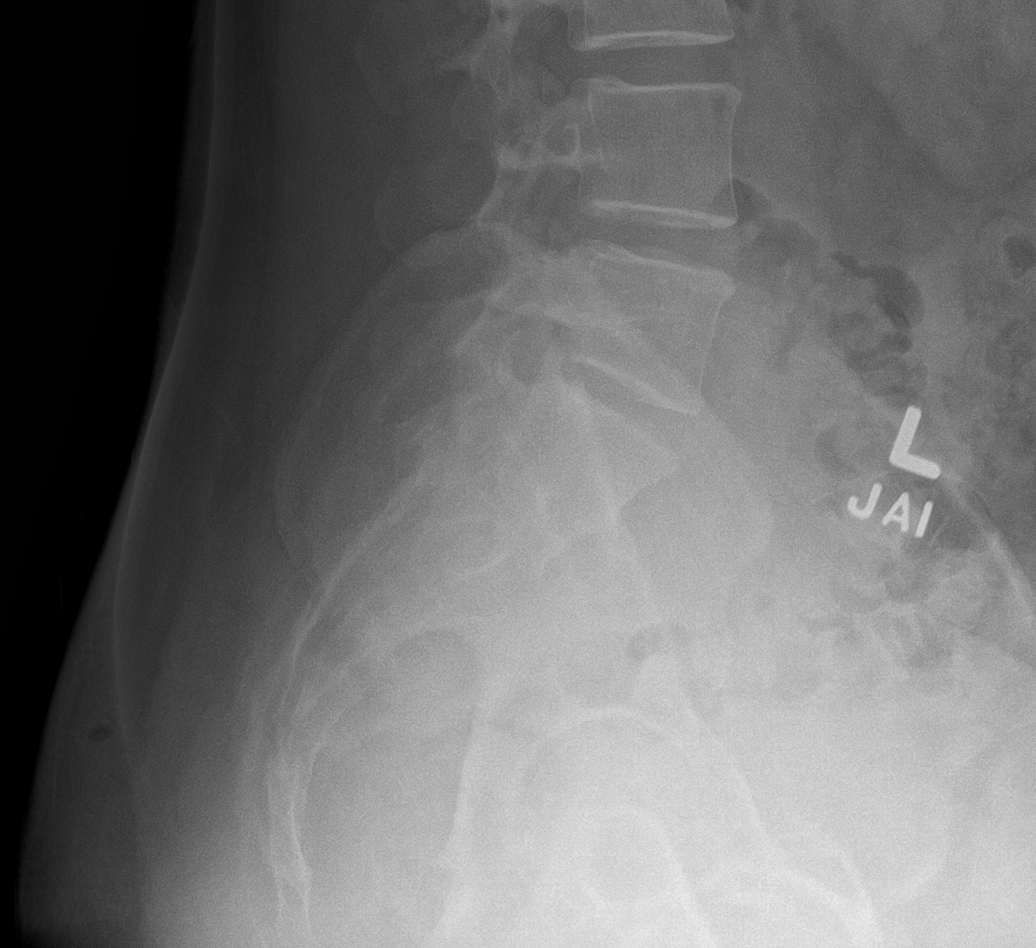

[5 of 5 positions shown; findings below may reference images not displayed]

FINDINGS: Five non rib-bearing lumbar type vertebra. Lumbar alignment within
normal limits. Vertebral body heights are maintained. Mild disc
space narrowing at L5-S1.
IMPRESSION: Minimal degenerative change at L5-S1

## 2020-07-29 MED ORDER — KETOROLAC TROMETHAMINE 60 MG/2ML IM SOLN
60.0000 mg | Freq: Once | INTRAMUSCULAR | Status: AC
  Administered 2020-07-29: 60 mg via INTRAMUSCULAR

## 2020-07-29 MED ORDER — METHYLPREDNISOLONE ACETATE 80 MG/ML IJ SUSP
80.0000 mg | Freq: Once | INTRAMUSCULAR | Status: AC
  Administered 2020-07-29: 80 mg via INTRAMUSCULAR

## 2020-07-29 NOTE — Progress Notes (Signed)
[facility] [address] [place] Phone: [phone] Subjective:   I Johnny [name] am serving as a Education administrator for Dr. Hulan Saas.  This visit occurred during the SARS-CoV-2 public health emergency.  Safety protocols were in place, including screening questions prior to the visit, additional usage of staff PPE, and extensive cleaning of exam room while observing appropriate contact time as indicated for disinfecting solutions.   I'm seeing this patient by the request  of:  Vivi Barrack, MD  CC: Back pain follow-up  [id number]  Johnny [name] is a 40 y.o. male coming in with complaint of back and neck pain. OMT 05/15/2020. Patient states the back has not being doing well. Neck is fine.  Patient states having worsening pain affecting daily activities.  Over the last month since he has had COVID patient has noticed more back pain.  No radiation down the legs again.  Was seen by chiropractor but has not noticed significant improvement.  Medications patient has been prescribed: Effexor, Vit D  Taking: Yes         Reviewed prior external information including notes and imaging from previsou exam, outside providers and external EMR if available.   As well as notes that were available from care everywhere and other healthcare systems.  Past medical history, social, surgical and family history all reviewed in electronic medical record.  No pertanent information unless stated regarding to the chief complaint.   Past Medical History:  Diagnosis Date  . Crohn's disease ([place])   . GERD (gastroesophageal reflux disease)   . Hyperlipidemia   . Hypertension     Allergies  Allergen Reactions  . Other Diarrhea and Nausea And Vomiting    Champagne   . Codeine Nausea Only     Review of Systems:  No headache, visual changes, nausea, vomiting, diarrhea, constipation, dizziness, abdominal pain, skin rash, fevers, chills, night sweats,  weight loss, swollen lymph nodes, body aches, joint swelling, chest pain, shortness of breath, mood changes. POSITIVE muscle aches  Objective  Blood pressure 140/90, pulse 79, height 6' 4"  (1.93 m), weight 246 lb (111.6 kg), SpO2 97 %.   General: No apparent distress alert and oriented x3 mood and affect normal, dressed appropriately.  HEENT: Pupils equal, extraocular movements intact  Respiratory: Patient's speak in full sentences and does not appear short of breath  Cardiovascular: No lower extremity edema, non tender, no erythema  Gait normal with good balance and coordination.  MSK:  Non tender with full range of motion and good stability and symmetric strength and tone of shoulders, elbows, wrist, hip, knee and ankles bilaterally.  Back -low back exam does have some loss of lordosis.  Mild positive straight leg test with radicular symptoms more in the L5 distribution bilaterally.  No weakness still noted. Deep tendon reflexes are intact  Osteopathic findings   T9 extended rotated and side bent left L2 flexed rotated and side bent right L5 flexed rotated and side bent left Sacrum right on right       Assessment and Plan:  Low back pain Patient has had some difficulty overall.  Continuing with the same medications but patient is having chronic problem with exacerbation.  We will try another L5-S1 epidural to see how patient responds.  Patient has had L4-L5 previously.  He is avoiding long-term anti-inflammatory secondary to patient's Crohn's disease.  Given an injection of Toradol and Depo-Medrol today.  Discussed icing regimen and home exercises.  We  will get repeat x-rays to further evaluate.  Follow-up with me again in 5 to 6 weeks if patient responds somewhat to the epidural at L5-S1 Consider a quick repeat at L4-L5    Nonallopathic problems  Decision today to treat with OMT was based on Physical Exam  After verbal consent patient was treated with HVLA, ME, FPR techniques  in  thoracic, lumbar, and sacral  areas  Patient tolerated the procedure well with improvement in symptoms  Patient given exercises, stretches and lifestyle modifications  See medications in patient instructions if given  Patient will follow up in 4-8 weeks      The above documentation has been reviewed and is accurate and complete [name], DO       Note: This dictation was prepared with Dragon dictation along with smaller phrase technology. Any transcriptional errors that result from this process are unintentional.

## 2020-07-29 NOTE — Assessment & Plan Note (Signed)
Patient has had some difficulty overall.  Continuing with the same medications but patient is having chronic problem with exacerbation.  We will try another L5-S1 epidural to see how patient responds.  Patient has had L4-L5 previously.  He is avoiding long-term anti-inflammatory secondary to patient's Crohn's disease.  Given an injection of Toradol and Depo-Medrol today.  Discussed icing regimen and home exercises.  We will get repeat x-rays to further evaluate.  Follow-up with me again in 5 to 6 weeks if patient responds somewhat to the epidural at L5-S1 Consider a quick repeat at L4-L5

## 2020-07-29 NOTE — Patient Instructions (Signed)
Good to see you Back xray Epidural L5-S1 See me again in 5-6 weeks

## 2020-07-30 ENCOUNTER — Other Ambulatory Visit: Payer: Self-pay | Admitting: Family Medicine

## 2020-08-01 ENCOUNTER — Encounter: Payer: Self-pay | Admitting: Family Medicine

## 2020-08-01 ENCOUNTER — Other Ambulatory Visit: Payer: Self-pay | Admitting: Family Medicine

## 2020-08-05 ENCOUNTER — Other Ambulatory Visit: Payer: Self-pay

## 2020-08-05 ENCOUNTER — Ambulatory Visit
Admission: RE | Admit: 2020-08-05 | Discharge: 2020-08-05 | Disposition: A | Discharge status: Home / Self Care | Payer: PRIVATE HEALTH INSURANCE | Source: Ambulatory Visit | Attending: Family Medicine | Admitting: Family Medicine

## 2020-08-05 DIAGNOSIS — G8929 Other chronic pain: Secondary | ICD-10-CM

## 2020-08-05 IMAGING — XA Imaging study
2 series · 2 of 2 positions shown · non-contrast
Comparison: none

CLINICAL DATA: Spondylosis without myelopathy. Back and left worse
than right pain. Minimal improvement from previous L4-5 injection

[Series 1: ortho standard · 1 of 1 slices shown (1 of 2)]
[im 1/1]
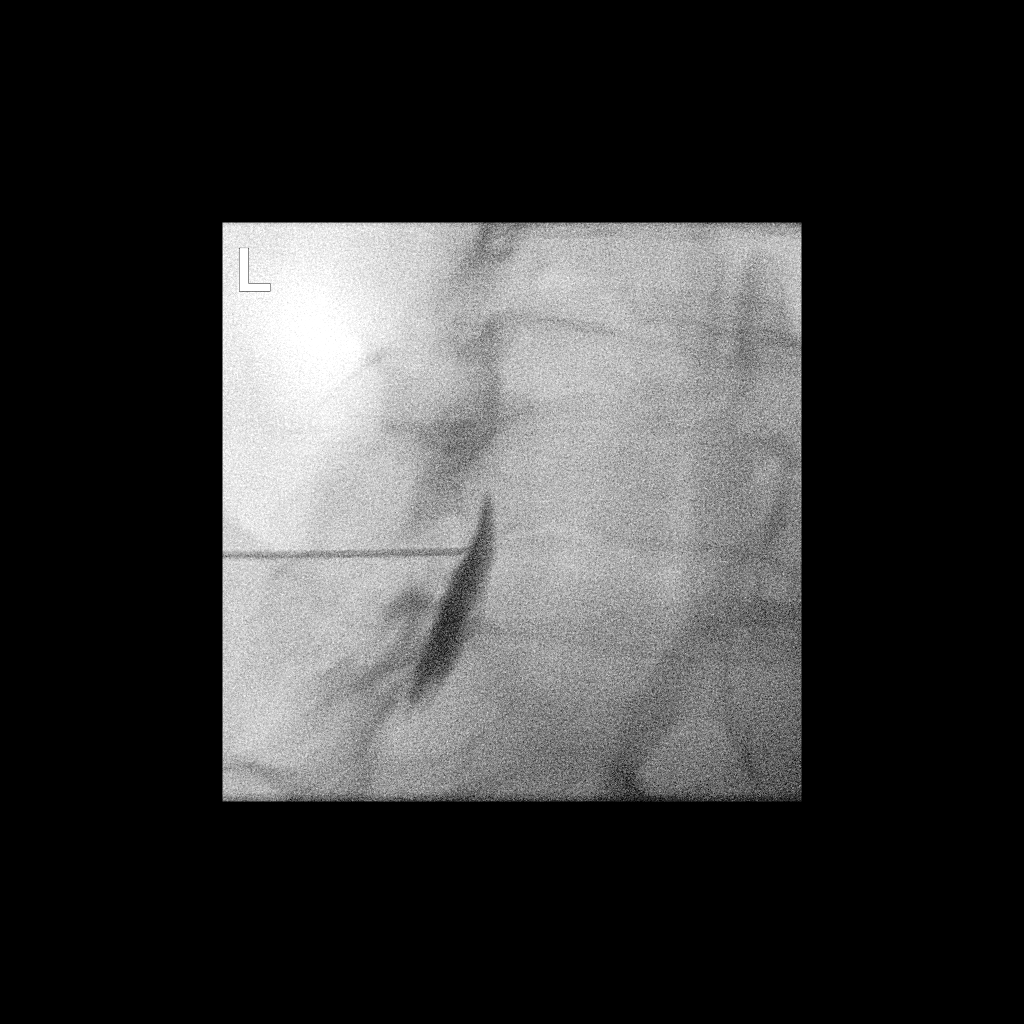

[Series 2: ortho standard · 1 of 1 slices shown (2 of 2)]
[im 1/1]
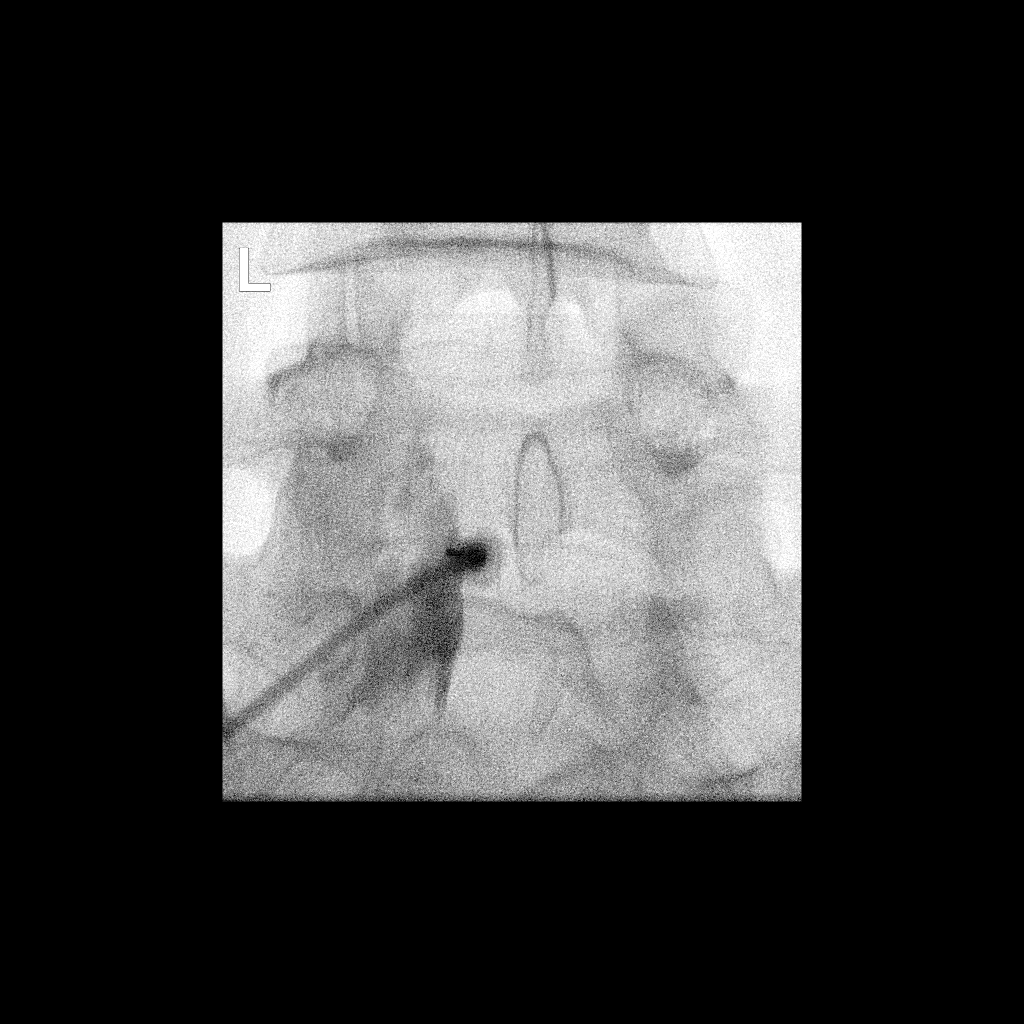

[2 of 2 positions shown; findings below may reference images not displayed]

FLUOROSCOPY TIME:  0 minutes 23 seconds. 13.00 micro gray meter
squared

PROCEDURE:
The procedure, risks, benefits, and alternatives were explained to
the patient. Questions regarding the procedure were encouraged and
answered. The patient understands and consents to the procedure.

LUMBAR EPIDURAL INJECTION:

An interlaminar approach was performed on the left at L5-S1. The
overlying skin was cleansed and anesthetized. A 20 gauge epidural
needle was advanced using loss-of-resistance technique.

DIAGNOSTIC EPIDURAL INJECTION:

Injection of Isovue-M 200 shows a good epidural pattern with spread
above and below the level of needle placement, primarily on the
left. No vascular opacification is seen.

THERAPEUTIC EPIDURAL INJECTION:

Eighty mg of Depo-Medrol mixed with 2.5 cc 1% lidocaine were
instilled. The procedure was well-tolerated, and the patient was
discharged thirty minutes following the injection in good condition.

COMPLICATIONS:
None
IMPRESSION: Technically successful epidural injection on the left at L5-S1.

## 2020-08-05 MED ORDER — METHYLPREDNISOLONE ACETATE 40 MG/ML INJ SUSP (RADIOLOG
120.0000 mg | Freq: Once | INTRAMUSCULAR | Status: AC
  Administered 2020-08-05: 120 mg via EPIDURAL

## 2020-08-05 MED ORDER — IOPAMIDOL (ISOVUE-M 200) INJECTION 41%
1.0000 mL | Freq: Once | INTRAMUSCULAR | Status: AC
  Administered 2020-08-05: 1 mL via EPIDURAL

## 2020-08-05 NOTE — Discharge Instructions (Signed)
Post Procedure Spinal Discharge Instruction Sheet  You may resume a regular diet and any medications that you routinely take (including pain medications).  No driving day of procedure.  Light activity throughout the rest of the day.  Do not do any strenuous work, exercise, bending or lifting.  The day following the procedure, you can resume normal physical activity but you should refrain from exercising or physical therapy for at least three days thereafter.   Common Side Effects:  Headaches- take your usual medications as directed by your physician.  Increase your fluid intake.  Caffeinated beverages may be helpful.  Lie flat in bed until your headache resolves.  Restlessness or inability to sleep- you may have trouble sleeping for the next few days.  Ask your referring physician if you need any medication for sleep.  Facial flushing or redness- should subside within a few days.  Increased pain- a temporary increase in pain a day or two following your procedure is not unusual.  Take your pain medication as prescribed by your referring physician.  Leg cramps  Please contact our office at [phone] for the following symptoms: Fever greater than 100 degrees. Headaches unresolved with medication after 2-3 days. Increased swelling, pain, or redness at injection site.   Thank you for visiting [facility] today.

## 2020-08-11 ENCOUNTER — Other Ambulatory Visit: Payer: Self-pay

## 2020-08-11 DIAGNOSIS — M5416 Radiculopathy, lumbar region: Secondary | ICD-10-CM

## 2020-08-13 NOTE — Progress Notes (Deleted)
  [place] Phone: [phone] Subjective:    I'm seeing this patient by the request  of:  Vivi Barrack, MD  CC: back and neck pain   [id number]  Johnny [name] is a 40 y.o. male coming in with complaint of back and neck pain. OMT 07/29/2020. Patient states   Medications patient has been prescribed: Effexor, Vit D, Gabapentin  Taking:         Reviewed prior external information including notes and imaging from previsou exam, outside providers and external EMR if available.   As well as notes that were available from care everywhere and other healthcare systems.  Past medical history, social, surgical and family history all reviewed in electronic medical record.  No pertanent information unless stated regarding to the chief complaint.   Past Medical History:  Diagnosis Date   Crohn's disease ([place])    GERD (gastroesophageal reflux disease)    Hyperlipidemia    Hypertension     Allergies  Allergen Reactions   Other Diarrhea and Nausea And Vomiting    Champagne    Codeine Nausea Only     Review of Systems:  No headache, visual changes, nausea, vomiting, diarrhea, constipation, dizziness, abdominal pain, skin rash, fevers, chills, night sweats, weight loss, swollen lymph nodes, body aches, joint swelling, chest pain, shortness of breath, mood changes. POSITIVE muscle aches  Objective  There were no vitals taken for this visit.   General: No apparent distress alert and oriented x3 mood and affect normal, dressed appropriately.  HEENT: Pupils equal, extraocular movements intact  Respiratory: Patient's speak in full sentences and does not appear short of breath  Cardiovascular: No lower extremity edema, non tender, no erythema  Neuro: Cranial nerves II through XII are intact, neurovascularly intact in all extremities with 2+ DTRs and 2+ pulses.  Gait normal with good balance and coordination.   MSK:  Non tender with full range of motion and good stability and symmetric strength and tone of shoulders, elbows, wrist, hip, knee and ankles bilaterally.  Back - Normal skin, Spine with normal alignment and no deformity.  No tenderness to vertebral process palpation.  Paraspinous muscles are not tender and without spasm.   Range of motion is full at neck and lumbar sacral regions  Osteopathic findings  C2 flexed rotated and side bent right C6 flexed rotated and side bent left T3 extended rotated and side bent right inhaled rib T9 extended rotated and side bent left L2 flexed rotated and side bent right Sacrum right on right       Assessment and Plan:    Nonallopathic problems  Decision today to treat with OMT was based on Physical Exam  After verbal consent patient was treated with HVLA, ME, FPR techniques in cervical, rib, thoracic, lumbar, and sacral  areas  Patient tolerated the procedure well with improvement in symptoms  Patient given exercises, stretches and lifestyle modifications  See medications in patient instructions if given  Patient will follow up in 4-8 weeks      The above documentation has been reviewed and is accurate and complete [name], DO       Note: This dictation was prepared with Dragon dictation along with smaller phrase technology. Any transcriptional errors that result from this process are unintentional.

## 2020-08-14 ENCOUNTER — Ambulatory Visit: Payer: No Typology Code available for payment source | Admitting: Family Medicine

## 2020-08-21 ENCOUNTER — Other Ambulatory Visit: Payer: Self-pay

## 2020-08-21 ENCOUNTER — Ambulatory Visit
Admission: RE | Admit: 2020-08-21 | Discharge: 2020-08-21 | Disposition: A | Discharge status: Home / Self Care | Payer: No Typology Code available for payment source | Source: Ambulatory Visit | Attending: Family Medicine | Admitting: Family Medicine

## 2020-08-21 DIAGNOSIS — M5416 Radiculopathy, lumbar region: Secondary | ICD-10-CM

## 2020-08-21 IMAGING — XA Imaging study
2 series · 2 of 2 positions shown · non-contrast
Comparison: none

CLINICAL DATA: 40-year-old male with lumbosacral spondylosis
without myelopathy. He has a known central disc protrusion at L5-S1
with chronic lumbar spine pain. Most recent left L4-L5 epidural
steroid injection resulted in minimal improvement. Subsequently, a
left L5-S1 injection was performed on [DATE]. Patient has since
experienced approximately 30% pain relief and now presents for
injection #2.

[Series 1: ortho adipose · 1 of 1 slices shown (1 of 2)]
[im 1/1]
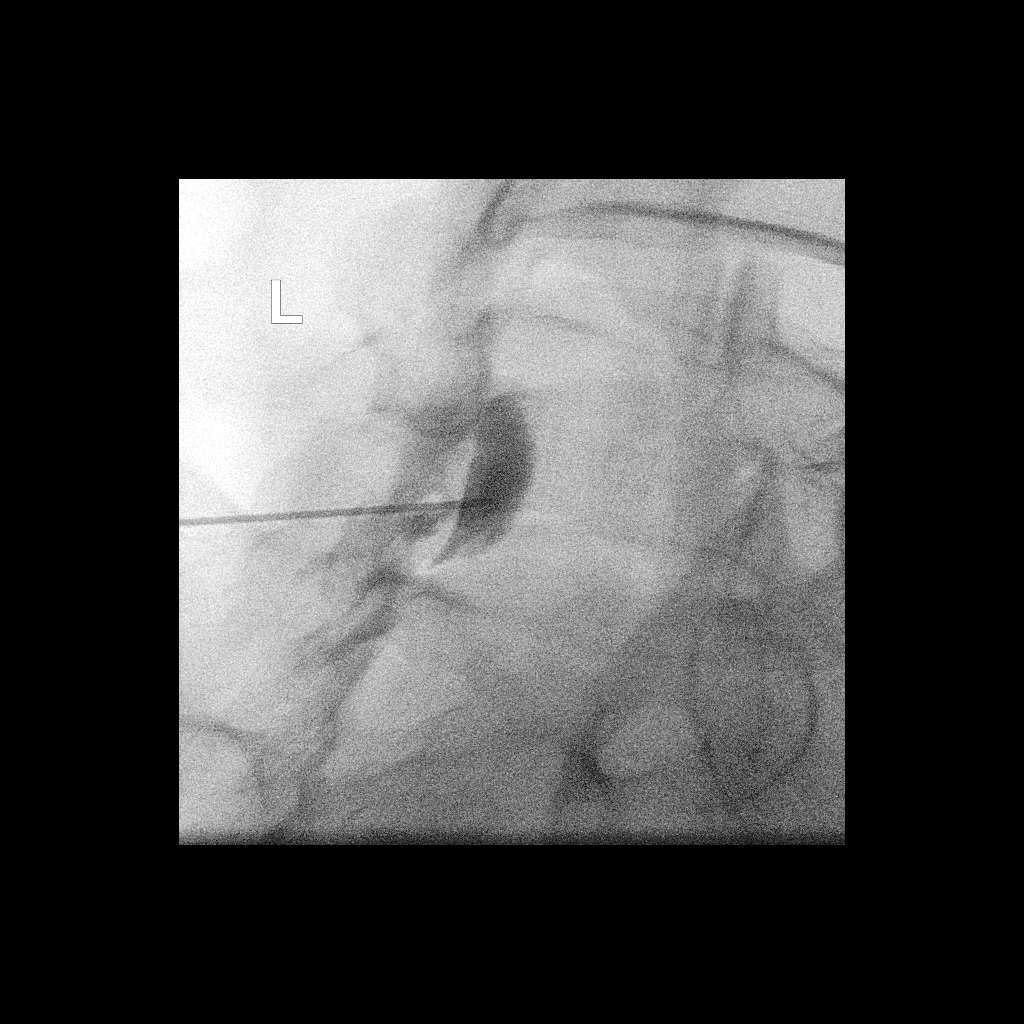

[Series 2: ortho adipose · 1 of 1 slices shown (2 of 2)]
[im 1/1]
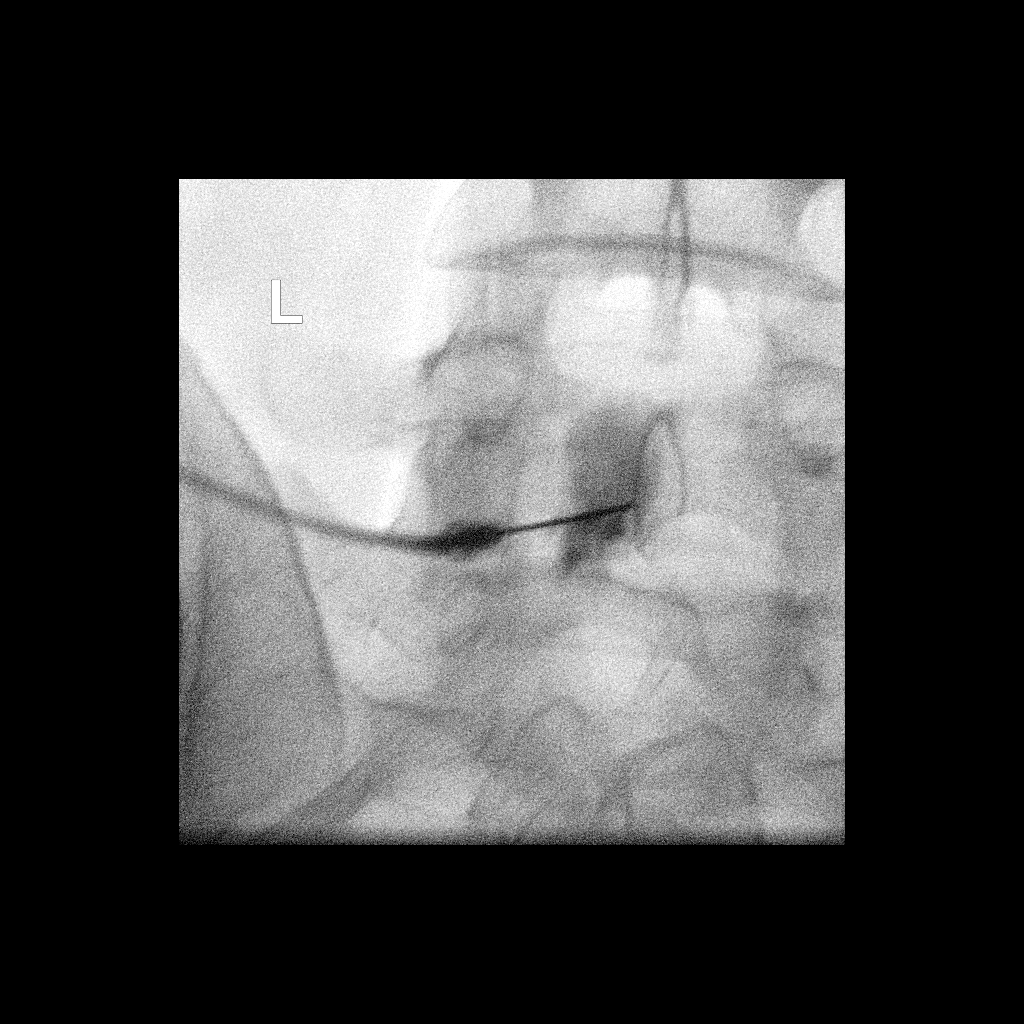

[2 of 2 positions shown; findings below may reference images not displayed]

FLUOROSCOPY TIME:  0 minutes 48 seconds 7.4 mGy

PROCEDURE:
The procedure, risks, benefits, and alternatives were explained to
the patient. Questions regarding the procedure were encouraged and
answered. The patient understands and consents to the procedure.

LUMBAR EPIDURAL INJECTION:

An interlaminar approach was performed on left at L5-S1. The
overlying skin was cleansed and anesthetized. A 20 gauge epidural
needle was advanced using loss-of-resistance technique.

DIAGNOSTIC EPIDURAL INJECTION:

Injection of Isovue-M 200 shows a good epidural pattern with spread
above and below the level of needle placement, primarily on the left
no vascular opacification is seen.

THERAPEUTIC EPIDURAL INJECTION:

80 mg of Depo-Medrol mixed with 2 mL 1% lidocaine were instilled.
The procedure was well-tolerated, and the patient was discharged
thirty minutes following the injection in good condition.

COMPLICATIONS:
None immediate
IMPRESSION: Technically successful epidural injection on the left L5-S1 #2.

## 2020-08-21 MED ORDER — METHYLPREDNISOLONE ACETATE 40 MG/ML INJ SUSP (RADIOLOG
80.0000 mg | Freq: Once | INTRAMUSCULAR | Status: AC
  Administered 2020-08-21: 80 mg via EPIDURAL

## 2020-08-21 MED ORDER — IOPAMIDOL (ISOVUE-M 200) INJECTION 41%
1.0000 mL | Freq: Once | INTRAMUSCULAR | Status: AC
  Administered 2020-08-21: 1 mL via EPIDURAL

## 2020-08-21 NOTE — Discharge Instructions (Signed)

## 2020-08-24 ENCOUNTER — Other Ambulatory Visit: Payer: Self-pay | Admitting: Family Medicine

## 2020-09-10 NOTE — Progress Notes (Signed)
[facility] [address] [place] Phone: [phone] Subjective:   I Johnny [name] am serving as a Education administrator for Dr. Hulan Saas.  This visit occurred during the SARS-CoV-2 public health emergency.  Safety protocols were in place, including screening questions prior to the visit, additional usage of staff PPE, and extensive cleaning of exam room while observing appropriate contact time as indicated for disinfecting solutions.   I'm seeing this patient by the request  of:  Vivi Barrack, MD  CC: Neck and back pain follow-up  [id number]  FARZAD TIBBETTS is a 40 y.o. male coming in with complaint of back and neck pain. OMT 07/29/2020. Patient states the neck is fine. Back is still painful. 2nd shot wasn't so successful.  Patient had another epidural in the L5-S1 area.  States that there was no significant improvement.  Since we have seen patient as well did have food poisoning which did cause potentially a Crohn's flare as well.  Medications patient has been prescribed: Vit D, Gabapentin  Taking:         Reviewed prior external information including notes and imaging from previsou exam, outside providers and external EMR if available.   As well as notes that were available from care everywhere and other healthcare systems.  Past medical history, social, surgical and family history all reviewed in electronic medical record.  No pertanent information unless stated regarding to the chief complaint.   Past Medical History:  Diagnosis Date   Crohn's disease ([place])    GERD (gastroesophageal reflux disease)    Hyperlipidemia    Hypertension     Allergies  Allergen Reactions   Other Diarrhea and Nausea And Vomiting    Champagne    Codeine Nausea Only     Review of Systems:  No headache, visual changes, nausea, vomiting, diarrhea, constipation, dizziness, abdominal pain, skin rash, fevers, chills, night sweats, weight loss, swollen  lymph nodes, body aches, joint swelling, chest pain, shortness of breath, mood changes. POSITIVE muscle aches  Objective  Blood pressure 140/90, pulse 87, height 6' 4"  (1.93 m), weight 233 lb (105.7 kg), SpO2 99 %.   General: No apparent distress alert and oriented x3 mood and affect normal, dressed appropriately.  HEENT: Pupils equal, extraocular movements intact  Respiratory: Patient's speak in full sentences and does not appear short of breath  Cardiovascular: No lower extremity edema, non tender, no erythema  Low back exam does show that patient does have tightness noted more as well as potential swelling noted over the sacroiliac joints bilaterally.  Some mild limited range of motion in flexion extension of the back.  Tightness with FABER test.  Straight leg test does not have true radicular symptoms but does have unfortunately tightness noted.  Osteopathic findings  C2 flexed rotated and side bent right C6 flexed rotated and side bent left T3 extended rotated and side bent right inhaled rib T9 extended rotated and side bent left L2 flexed rotated and side bent right Sacrum right on right   After verbal consent patient was prepped with alcohol swab and with a 22-gauge 2 inch needle injected into the right sacroiliac joint with a total of 0.5 cc of 0.5% Marcaine and 1 cc of Kenalog 40 mg/mL.  No blood loss.  Postinjection instructions given.  After verbal consent patient was prepped with alcohol swab and injected into the left sacroiliac joint with patient in a standing flexed position of 35 degrees with a 22-gauge 2  inch needle into the left sacroiliac joint.  Total of 0.5 cc of 0.5% Marcaine and 1 cc of Kenalog 40 mg/mL used.  No blood loss.  Postinjection instructions given.    Assessment and Plan: Low back pain Patient did not respond well to the epidural at this time.  Patient is having more difficulty.  Attempted sacroiliac injections.  We are considering further evaluation by  neurosurgery but likely not a surgical candidate based on the MRIs.  Would need to consider imaging again.  Attempted sacroiliac joint injections today to see how patient responds.  Follow-up with me again in 6 to 8 weeks    Nonallopathic problems  Decision today to treat with OMT was based on Physical Exam  After verbal consent patient was treated with HVLA, ME, FPR techniques in  thoracic, lumbar, and sacral  areas  Patient tolerated the procedure well with improvement in symptoms  Patient given exercises, stretches and lifestyle modifications  See medications in patient instructions if given  Patient will follow up in 4-8 weeks      The above documentation has been reviewed and is accurate and complete [name], DO        Note: This dictation was prepared with Dragon dictation along with smaller phrase technology. Any transcriptional errors that result from this process are unintentional.

## 2020-09-11 ENCOUNTER — Ambulatory Visit: Payer: No Typology Code available for payment source | Admitting: Family Medicine

## 2020-09-11 ENCOUNTER — Encounter: Payer: Self-pay | Admitting: Family Medicine

## 2020-09-11 ENCOUNTER — Other Ambulatory Visit: Payer: Self-pay

## 2020-09-11 VITALS — BP 140/90 | HR 87 | Ht 76.0 in | Wt 233.0 lb

## 2020-09-11 DIAGNOSIS — M545 Low back pain, unspecified: Secondary | ICD-10-CM | POA: Diagnosis not present

## 2020-09-11 DIAGNOSIS — M9904 Segmental and somatic dysfunction of sacral region: Secondary | ICD-10-CM | POA: Diagnosis not present

## 2020-09-11 DIAGNOSIS — M533 Sacrococcygeal disorders, not elsewhere classified: Secondary | ICD-10-CM

## 2020-09-11 DIAGNOSIS — G8929 Other chronic pain: Secondary | ICD-10-CM

## 2020-09-11 DIAGNOSIS — M9902 Segmental and somatic dysfunction of thoracic region: Secondary | ICD-10-CM

## 2020-09-11 DIAGNOSIS — M9903 Segmental and somatic dysfunction of lumbar region: Secondary | ICD-10-CM | POA: Diagnosis not present

## 2020-09-11 NOTE — Patient Instructions (Signed)
Good to see you SI joint injections today Send me a message in a few weeks let me know how they did  See me again in 6-8 weeks

## 2020-09-11 NOTE — Assessment & Plan Note (Signed)
Patient did not respond well to the epidural at this time.  Patient is having more difficulty.  Attempted sacroiliac injections.  We are considering further evaluation by neurosurgery but likely not a surgical candidate based on the MRIs.  Would need to consider imaging again.  Attempted sacroiliac joint injections today to see how patient responds.  Follow-up with me again in 6 to 8 weeks

## 2020-09-17 ENCOUNTER — Encounter: Payer: Self-pay | Admitting: Family Medicine

## 2020-09-18 MED ORDER — PREDNISONE 20 MG PO TABS: 40.0000 mg | ORAL_TABLET | Freq: Every day | ORAL | 0 refills | Status: DC

## 2020-09-21 ENCOUNTER — Other Ambulatory Visit: Payer: Self-pay | Admitting: Family Medicine

## 2020-10-02 ENCOUNTER — Other Ambulatory Visit: Payer: Self-pay | Admitting: Family Medicine

## 2020-10-22 NOTE — Progress Notes (Signed)
[facility] [place] Phone: [phone] Subjective:   Johnny [name], am serving as a scribe for Dr. Hulan Saas.  This visit occurred during the SARS-CoV-2 public health emergency.  Safety protocols were in place, including screening questions prior to the visit, additional usage of staff PPE, and extensive cleaning of exam room while observing appropriate contact time as indicated for disinfecting solutions.    I'm seeing this patient by the request  of:  Vivi Barrack, MD  CC: Back and neck pain  [id number]  Johnny [name] is a 40 y.o. male coming in with complaint of back and neck pain. OMT 09/02/2020. Patient states that the SI joint injections and prednisone gave him a month of relief.  Patient states that did not return prior to the injections.  Patient does feel that the sacroiliac injections helped more than the back injections.  Medications patient has been prescribed: Effexor, Zanaflex, Gabapentin, Vit D           Reviewed prior external information including notes and imaging from previsou exam, outside providers and external EMR if available.   As well as notes that were available from care everywhere and other healthcare systems.  Past medical history, social, surgical and family history all reviewed in electronic medical record.  [name] information unless stated regarding to the chief complaint.   Past Medical History:  Diagnosis Date   Crohn's disease ([place])    GERD (gastroesophageal reflux disease)    Hyperlipidemia    Hypertension     Allergies  Allergen Reactions   Other Diarrhea and Nausea And Vomiting    Champagne    Codeine Nausea Only     Review of Systems:  [name], visual changes, nausea, vomiting, diarrhea, constipation, dizziness, abdominal pain, skin rash, fevers, chills, night sweats, weight loss, swollen lymph nodes, body aches, joint swelling, chest pain,  shortness of breath, mood changes. POSITIVE muscle aches  Objective  Blood pressure 122/82, pulse 83, height 6' 4"  (1.93 m), weight 238 lb (108 kg), SpO2 98 %.   General: [name] apparent distress alert and oriented x3 mood and affect normal, dressed appropriately.  HEENT: Pupils equal, extraocular movements intact  Respiratory: Patient's speak in full sentences and does not appear short of breath  Cardiovascular: [name] lower extremity edema, non tender, [name] erythema  Neuro: Cranial nerves II through XII are intact, neurovascularly intact in all extremities with 2+  Low back exam still has tightness noted.  Tenderness over the sacroiliac joint bilaterally.  Worsening pain in the back with some extension greater than 10 degrees.  Tightness with FABER test bilaterally.   Osteopathic findings   C6 flexed rotated and side bent left T3 extended rotated and side bent right inhaled rib T9 extended rotated and side bent left L2 flexed rotated and side bent right Sacrum right on right       Assessment and Plan: Low back pain Patient is of the low back pain.  Has been doing relatively well with the medications and did respond well to the sacroiliac joints previously.  Discussed icing regimen and home exercises.  Other contributing.  Continue the vitamin D supplementation.  Follow-up again in 6 to 8 weeks    Nonallopathic problems  Decision today to treat with OMT was based on Physical Exam  After verbal consent patient was treated with HVLA, ME, FPR techniques in cervical, rib, thoracic, lumbar, and sacral  areas  Patient tolerated the  procedure well with improvement in symptoms  Patient given exercises, stretches and lifestyle modifications  See medications in patient instructions if given  Patient will follow up in 4-8 weeks      The above documentation has been reviewed and is accurate and complete [name], DO        Note: This dictation was prepared with Dragon  dictation along with smaller phrase technology. Any transcriptional errors that result from this process are unintentional.

## 2020-10-23 ENCOUNTER — Other Ambulatory Visit: Payer: Self-pay

## 2020-10-23 ENCOUNTER — Ambulatory Visit: Payer: No Typology Code available for payment source | Admitting: Family Medicine

## 2020-10-23 VITALS — BP 122/82 | HR 83 | Ht 76.0 in | Wt 238.0 lb

## 2020-10-23 DIAGNOSIS — M9908 Segmental and somatic dysfunction of rib cage: Secondary | ICD-10-CM | POA: Diagnosis not present

## 2020-10-23 DIAGNOSIS — M545 Low back pain, unspecified: Secondary | ICD-10-CM | POA: Diagnosis not present

## 2020-10-23 DIAGNOSIS — M9901 Segmental and somatic dysfunction of cervical region: Secondary | ICD-10-CM | POA: Diagnosis not present

## 2020-10-23 DIAGNOSIS — M9903 Segmental and somatic dysfunction of lumbar region: Secondary | ICD-10-CM | POA: Diagnosis not present

## 2020-10-23 DIAGNOSIS — G8929 Other chronic pain: Secondary | ICD-10-CM

## 2020-10-23 DIAGNOSIS — M9904 Segmental and somatic dysfunction of sacral region: Secondary | ICD-10-CM

## 2020-10-23 DIAGNOSIS — M9902 Segmental and somatic dysfunction of thoracic region: Secondary | ICD-10-CM

## 2020-10-23 NOTE — Patient Instructions (Signed)
See me in 6-8 weeks

## 2020-10-23 NOTE — Assessment & Plan Note (Signed)
Patient is of the low back pain.  Has been doing relatively well with the medications and did respond well to the sacroiliac joints previously.  Discussed icing regimen and home exercises.  Other contributing.  Continue the vitamin D supplementation.  Follow-up again in 6 to 8 weeks

## 2020-10-31 ENCOUNTER — Other Ambulatory Visit: Payer: Self-pay | Admitting: Family Medicine

## 2020-11-07 ENCOUNTER — Other Ambulatory Visit: Payer: Self-pay | Admitting: Family Medicine

## 2020-12-03 ENCOUNTER — Other Ambulatory Visit: Payer: Self-pay

## 2020-12-03 ENCOUNTER — Encounter: Payer: Self-pay | Admitting: Family Medicine

## 2020-12-03 ENCOUNTER — Ambulatory Visit: Payer: No Typology Code available for payment source | Admitting: Family Medicine

## 2020-12-03 ENCOUNTER — Ambulatory Visit: Payer: No Typology Code available for payment source | Admitting: Sports Medicine

## 2020-12-03 ENCOUNTER — Other Ambulatory Visit: Payer: Self-pay | Admitting: Family Medicine

## 2020-12-03 VITALS — BP 124/82 | HR 85 | Ht 76.0 in | Wt 241.0 lb

## 2020-12-03 DIAGNOSIS — M9904 Segmental and somatic dysfunction of sacral region: Secondary | ICD-10-CM

## 2020-12-03 DIAGNOSIS — M9903 Segmental and somatic dysfunction of lumbar region: Secondary | ICD-10-CM

## 2020-12-03 DIAGNOSIS — M533 Sacrococcygeal disorders, not elsewhere classified: Secondary | ICD-10-CM | POA: Diagnosis not present

## 2020-12-03 DIAGNOSIS — M9902 Segmental and somatic dysfunction of thoracic region: Secondary | ICD-10-CM | POA: Diagnosis not present

## 2020-12-03 DIAGNOSIS — M545 Low back pain, unspecified: Secondary | ICD-10-CM

## 2020-12-03 DIAGNOSIS — G8929 Other chronic pain: Secondary | ICD-10-CM

## 2020-12-03 NOTE — Assessment & Plan Note (Signed)
Bilateral injection given today, tolerated the procedure well, discussed icing regimen and home exercises.  Patient has had injection previously as well we will see how patient responds.  Follow-up with me again in 6 to 8 weeks.

## 2020-12-03 NOTE — Assessment & Plan Note (Signed)
Chronic low back pain.  Mild improvement with the L5-S1.  Has responded somewhat to the epidurals.  Patient has been on the Effexor and we could potentially consider increasing if necessary.  Follow-up with me again in 6 to 8 weeks.

## 2020-12-03 NOTE — Telephone Encounter (Signed)
Patient aware need OV for future refills  Transfer to front office for appointment

## 2020-12-03 NOTE — Progress Notes (Signed)
[facility] [place] Phone: [phone] Subjective:   [name], am serving as a scribe for Dr. Hulan Saas.  This visit occurred during the SARS-CoV-2 public health emergency.  Safety protocols were in place, including screening questions prior to the visit, additional usage of staff PPE, and extensive cleaning of exam room while observing appropriate contact time as indicated for disinfecting solutions.    I'm seeing this patient by the request  of:  Vivi Barrack, MD  CC: Back and neck pain  [id number]  CARREL LEATHER is a 40 y.o. male coming in with complaint of back and neck pain. OMT 10/23/2020. Patient states that his pain in lower back increased over past 2 weeks. Pain radiating into pelvis. Epidural June and also had SI injections that helped.  Seems to mostly of the back pain.  Patient states that it started to get a little more drastic over the last 2 weeks.  Medications patient has been prescribed:   Taking:         Past Medical History:  Diagnosis Date   Crohn's disease ([place])    GERD (gastroesophageal reflux disease)    Hyperlipidemia    Hypertension     Allergies  Allergen Reactions   Other Diarrhea and Nausea And Vomiting    Champagne    Codeine Nausea Only     Review of Systems:  No headache, visual changes, nausea, vomiting, diarrhea, constipation, dizziness, abdominal pain, skin rash, fevers, chills, night sweats, weight loss, swollen lymph nodes, body aches, joint swelling, chest pain, shortness of breath, mood changes. POSITIVE muscle aches  Objective  Blood pressure 124/82, pulse 85, height 6' 4"  (1.93 m), weight 241 lb (109.3 kg), SpO2 96 %.   General: No apparent distress alert and oriented x3 mood and affect normal, dressed appropriately.  HEENT: Pupils equal, extraocular movements intact  Respiratory: Patient's speak in full sentences and does not appear short of breath   Cardiovascular: No lower extremity edema, non tender, no erythema  Low back exam does have some tightness noted in the paraspinal musculature right greater than left.  Patient does have some worsening pain with extension of the back.  Patient does have some mild swelling noted over the sacroiliac joints bilaterally right greater than left.  Positive Faber on the right.  After verbal consent patient was prepped with alcohol swab and with a 21-gauge 2 inch needle injected in the right sacroiliac joint with a total of 1 cc 1% lidocaine and 1 cc of Kenalog 40 mg/mL.  No blood loss.  Postinjection instructions given.  After verbal consent patient was prepped with alcohol swab and with a 21-gauge 2 inch needle was injected into the left sacroiliac joint with a total of 1 cc of 1% lidocaine and 1 cc of Kenalog 40 mg/mL.  No blood loss.  Band-Aid placed.  Postinjection instructions given.  Osteopathic findings  T9 extended rotated and side bent left L2 flexed rotated and side bent right Sacrum right on right       Assessment and Plan: Sacroiliac pain Bilateral injection given today, tolerated the procedure well, discussed icing regimen and home exercises.  Patient has had injection previously as well we will see how patient responds.  Follow-up with me again in 6 to 8 weeks.  Low back pain Chronic low back pain.  Mild improvement with the L5-S1.  Has responded somewhat to the epidurals.  Patient has been on the Effexor  and we could potentially consider increasing if necessary.  Follow-up with me again in 6 to 8 weeks.    Nonallopathic problems  Decision today to treat with OMT was based on Physical Exam  After verbal consent patient was treated with HVLA, ME, FPR techniques in  thoracic, lumbar, and sacral  areas  Patient tolerated the procedure well with improvement in symptoms  Patient given exercises, stretches and lifestyle modifications  See medications in patient instructions if  given  Patient will follow up in 4-8 weeks     The above documentation has been reviewed and is accurate and complete [name], DO        Note: This dictation was prepared with Dragon dictation along with smaller phrase technology. Any transcriptional errors that result from this process are unintentional.

## 2020-12-03 NOTE — Patient Instructions (Addendum)
Injected B SI joints See me in 5-6 weeks

## 2020-12-04 ENCOUNTER — Ambulatory Visit: Payer: No Typology Code available for payment source | Admitting: Family Medicine

## 2020-12-09 ENCOUNTER — Other Ambulatory Visit: Payer: Self-pay

## 2020-12-09 ENCOUNTER — Encounter: Payer: Self-pay | Admitting: Family Medicine

## 2020-12-09 ENCOUNTER — Ambulatory Visit (INDEPENDENT_AMBULATORY_CARE_PROVIDER_SITE_OTHER): Payer: No Typology Code available for payment source | Admitting: Family Medicine

## 2020-12-09 VITALS — BP 151/81 | HR 71 | Temp 97.9°F | Ht 76.0 in | Wt 239.0 lb

## 2020-12-09 DIAGNOSIS — K5090 Crohn's disease, unspecified, without complications: Secondary | ICD-10-CM

## 2020-12-09 DIAGNOSIS — E785 Hyperlipidemia, unspecified: Secondary | ICD-10-CM

## 2020-12-09 DIAGNOSIS — K219 Gastro-esophageal reflux disease without esophagitis: Secondary | ICD-10-CM

## 2020-12-09 DIAGNOSIS — E559 Vitamin D deficiency, unspecified: Secondary | ICD-10-CM

## 2020-12-09 DIAGNOSIS — E538 Deficiency of other specified B group vitamins: Secondary | ICD-10-CM

## 2020-12-09 DIAGNOSIS — Z0001 Encounter for general adult medical examination with abnormal findings: Secondary | ICD-10-CM | POA: Diagnosis not present

## 2020-12-09 DIAGNOSIS — M545 Low back pain, unspecified: Secondary | ICD-10-CM

## 2020-12-09 DIAGNOSIS — Z6829 Body mass index (BMI) 29.0-29.9, adult: Secondary | ICD-10-CM

## 2020-12-09 DIAGNOSIS — I10 Essential (primary) hypertension: Secondary | ICD-10-CM | POA: Diagnosis not present

## 2020-12-09 DIAGNOSIS — G8929 Other chronic pain: Secondary | ICD-10-CM

## 2020-12-09 DIAGNOSIS — E663 Overweight: Secondary | ICD-10-CM

## 2020-12-09 DIAGNOSIS — R7303 Prediabetes: Secondary | ICD-10-CM

## 2020-12-09 LAB — CBC
HCT: 47.4 % (ref 39.0–52.0)
Hemoglobin: 15.1 g/dL (ref 13.0–17.0)
MCHC: 31.8 g/dL (ref 30.0–36.0)
MCV: 73.6 fl — ABNORMAL LOW (ref 78.0–100.0)
Platelets: 260 10*3/uL (ref 150.0–400.0)
RBC: 6.44 Mil/uL — ABNORMAL HIGH (ref 4.22–5.81)
RDW: 13.5 % (ref 11.5–15.5)
WBC: 10.4 10*3/uL (ref 4.0–10.5)

## 2020-12-09 LAB — COMPREHENSIVE METABOLIC PANEL
ALT: 26 U/L (ref 0–53)
AST: 18 U/L (ref 0–37)
Albumin: 4.7 g/dL (ref 3.5–5.2)
Alkaline Phosphatase: 67 U/L (ref 39–117)
BUN: 15 mg/dL (ref 6–23)
CO2: 24 mEq/L (ref 19–32)
Calcium: 9.6 mg/dL (ref 8.4–10.5)
Chloride: 103 mEq/L (ref 96–112)
Creatinine, Ser: 0.82 mg/dL (ref 0.40–1.50)
GFR: 109.2 mL/min (ref >60.00)
Glucose, Bld: 100 mg/dL — ABNORMAL HIGH (ref 70–99)
Potassium: 4 mEq/L (ref 3.5–5.1)
Sodium: 138 mEq/L (ref 135–145)
Total Bilirubin: 0.4 mg/dL (ref 0.2–1.2)
Total Protein: 7.6 g/dL (ref 6.0–8.3)

## 2020-12-09 LAB — LIPID PANEL
Cholesterol: 229 mg/dL — ABNORMAL HIGH (ref 0–200)
HDL: 46 mg/dL (ref >39.00)
NonHDL: 182.97
Total CHOL/HDL Ratio: 5
Triglycerides: 298 mg/dL — ABNORMAL HIGH (ref 0.0–149.0)
VLDL: 59.6 mg/dL — ABNORMAL HIGH (ref 0.0–40.0)

## 2020-12-09 LAB — VITAMIN B12: Vitamin B-12: 308 pg/mL (ref 211–911)

## 2020-12-09 LAB — VITAMIN D 25 HYDROXY (VIT D DEFICIENCY, FRACTURES): VITD: 30.44 ng/mL (ref 30.00–100.00)

## 2020-12-09 LAB — LDL CHOLESTEROL, DIRECT: Direct LDL: 166 mg/dL

## 2020-12-09 LAB — TSH: TSH: 3.19 u[IU]/mL (ref 0.35–5.50)

## 2020-12-09 LAB — HEMOGLOBIN A1C: Hgb A1c MFr Bld: 5.9 % (ref 4.6–6.5)

## 2020-12-09 MED ORDER — TRIAMCINOLONE ACETONIDE 0.5 % EX CREA: 1.0000 "application " | TOPICAL_CREAM | Freq: Three times a day (TID) | CUTANEOUS | 0 refills | Status: DC

## 2020-12-09 NOTE — Assessment & Plan Note (Signed)
Slightly above normal today.  Typically well controlled.  Continue amlodipine 5 mg daily.  Check labs.  Discussed home monitoring goal 140/90 or lower.

## 2020-12-09 NOTE — Assessment & Plan Note (Signed)
Check B12 

## 2020-12-09 NOTE — Progress Notes (Signed)
Chief Complaint:  Johnny [name] is a 40 y.o. male who presents today for his annual comprehensive physical exam.    Assessment/Plan:  Chronic Problems Addressed Today: Dyslipidemia Did not tolerate lipitor due to myalgias.  We will check lipids today.  May start simvastatin if he needs a statin depending on results.  Discussed lifestyle modifications.  Vitamin D deficiency chec Vit D.   Prediabetes Check A1c.  Discussed lifestyle modifications.  Vitamin B12 deficiency Check B12.  GERD (gastroesophageal reflux disease) Stable.  On omeprazole per GI.  Crohn's disease ([place]) Stable.  Continue management per GI.  Benign essential hypertension Slightly above normal today.  Typically well controlled.  Continue amlodipine 5 mg daily.  Check labs.  Discussed home monitoring goal 140/90 or lower.   Body mass index is 29.09 kg/m. / Overweight  BMI Metric Follow Up - 12/09/20 1407       BMI Metric Follow Up-Please document annually   BMI Metric Follow Up Education provided             Preventative Healthcare: UTD on flu vaccine and colonoscopy. Will get blood work today.   Patient Counseling(The following topics were reviewed and/or handout was given):  -Nutrition: Stressed importance of moderation in sodium/caffeine intake, saturated fat and cholesterol, caloric balance, sufficient intake of fresh fruits, vegetables, and fiber.  -Stressed the importance of regular exercise.   -Substance Abuse: Discussed cessation/primary prevention of tobacco, alcohol, or other drug use; driving or other dangerous activities under the influence; availability of treatment for abuse.   -Injury prevention: Discussed safety belts, safety helmets, smoke detector, smoking near bedding or upholstery.   -Sexuality: Discussed sexually transmitted diseases, partner selection, use of condoms, avoidance of unintended pregnancy and contraceptive alternatives.   -Dental health: Discussed importance of  regular tooth brushing, flossing, and dental visits.  -Health maintenance and immunizations reviewed. Please refer to Health maintenance section.  Return to care in 1 year for next preventative visit.     Subjective:  HPI:  He has no acute complaints today  He still have issue with back pain. He notes back pain get worse when bending forward. He is trying message therapy on october 20 for back pain. He follows up with his sport medicine Dr. Hulan Saas.  His blood pressure at office was elevated today. He notes his blood pressure readings at home is in 130's. He is taking Amlodipine 5 mg daily. Tolerating his medication well with no side effects.  Lifestyle Diet: Balanced. Exercise: Limited with back pain  Depression screen PHQ 2/9 12/09/2020  Decreased Interest 0  Down, Depressed, Hopeless 0  PHQ - 2 Score 0    Health Maintenance Due  Topic Date Due   Hepatitis C Screening  Never done   COVID-19 Vaccine (3 - Booster for Moderna series) 10/24/2019     ROS: Per HPI, otherwise a complete review of systems was negative.   PMH:  The following were reviewed and entered/updated in epic: Past Medical History:  Diagnosis Date   Crohn's disease ([place])    GERD (gastroesophageal reflux disease)    Hyperlipidemia    Hypertension    Patient Active Problem List   Diagnosis Date Noted   Sacroiliac pain 12/03/2020   Nonallopathic lesion of sacral region 08/24/2018   Nonallopathic lesion of lumbosacral region 08/24/2018   Nonallopathic lesion of thoracic region 08/24/2018   Low back pain 07/27/2018   Vitamin D deficiency 11/29/2017   Prediabetes 12/24/2016   Tinnitus 12/24/2016   Crohn's  disease ([place]) 06/11/2016   Eczema 06/11/2016   Fatty liver 06/11/2016   GERD (gastroesophageal reflux disease) 06/11/2016   IBS (irritable bowel syndrome) 06/11/2016   Vitamin B12 deficiency 06/11/2016   Dyslipidemia 05/25/2014   Benign essential hypertension 35/36/1443   Metabolic  syndrome 15/40/0867   Past Surgical History:  Procedure Laterality Date   LIVER BIOPSY      History reviewed. No pertinent family history.  Medications- reviewed and updated Current Outpatient Medications  Medication Sig Dispense Refill   amLODipine (NORVASC) 5 MG tablet TAKE 1 TABLET (5 MG TOTAL) BY MOUTH DAILY. 30 tablet 0   betamethasone valerate ointment (VALISONE) 0.1 % Apply 1 application topically 2 (two) times daily. 30 g 0   gabapentin (NEURONTIN) 400 MG capsule TAKE 1 CAPSULE (400 MG TOTAL) BY MOUTH AT BEDTIME. 90 capsule 1   mesalamine (LIALDA) 1.2 g EC tablet Take by mouth.     omeprazole (PRILOSEC) 40 MG capsule Take 1 capsule by mouth every morning.     tiZANidine (ZANAFLEX) 2 MG tablet TAKE 1 TABLET BY MOUTH EVERYDAY AT BEDTIME 90 tablet 1   triamcinolone cream (KENALOG) 0.5 % Apply 1 application topically 3 (three) times daily. 30 g 0   Vitamin D, Ergocalciferol, (DRISDOL) 1.25 MG (50000 UNIT) CAPS capsule TAKE 1 CAPSULE (50,000 UNITS TOTAL) BY MOUTH EVERY 7 (SEVEN) DAYS 12 capsule 0   No current facility-administered medications for this visit.    Allergies-reviewed and updated Allergies  Allergen Reactions   Other Diarrhea and Nausea And Vomiting    Champagne    Codeine Nausea Only    Social History   Socioeconomic History   Marital status: Married    Spouse name: Not on file   Number of children: Not on file   Years of education: Not on file   Highest education level: Not on file  Occupational History   Not on file  Tobacco Use   Smoking status: Never   Smokeless tobacco: Never  Vaping Use   Vaping Use: Never used  Substance and Sexual Activity   Alcohol use: No   Drug use: No   Sexual activity: Yes  Other Topics Concern   Not on file  Social History Narrative   Not on file   Social Determinants of Health   Financial Resource Strain: Not on file  Food Insecurity: Not on file  Transportation Needs: Not on file  Physical Activity: Not on  file  Stress: Not on file  Social Connections: Not on file        Objective:  Physical Exam: BP (!) 151/81   Pulse 71   Temp 97.9 F (36.6 C)   Ht 6' 4"  (1.93 m)   Wt 239 lb (108.4 kg)   SpO2 98%   BMI 29.09 kg/m   Body mass index is 29.09 kg/m. Wt Readings from Last 3 Encounters:  12/09/20 239 lb (108.4 kg)  12/03/20 241 lb (109.3 kg)  10/23/20 238 lb (108 kg)   Gen: NAD, resting comfortably HEENT: TMs normal bilaterally. OP clear. No thyromegaly noted.  CV: RRR with no murmurs appreciated Pulm: NWOB, CTAB with no crackles, wheezes, or rhonchi GI: Normal bowel sounds present. Soft, Nontender, Nondistended. MSK: no edema, cyanosis, or clubbing noted Skin: warm, dry Neuro: CN2-12 grossly intact. Strength 5/5 in upper and lower extremities. Reflexes symmetric and intact bilaterally.  Psych: Normal affect and thought content      I,Savera Zaman,acting as a scribe for Dimas Chyle, MD.,have documented all relevant documentation  on the behalf of Dimas Chyle, MD,as directed by  Dimas Chyle, MD while in the presence of Dimas Chyle, MD.   I, Dimas Chyle, MD, have reviewed all documentation for this visit. The documentation on 12/09/20 for the exam, diagnosis, procedures, and orders are all accurate and complete.  Algis Greenhouse. Jerline Pain, MD 12/09/2020 2:07 PM

## 2020-12-09 NOTE — Assessment & Plan Note (Signed)
chec Vit D.

## 2020-12-09 NOTE — Assessment & Plan Note (Signed)
Check A1c.  Discussed lifestyle modifications. °

## 2020-12-09 NOTE — Patient Instructions (Signed)
It was very nice to see you today!  We will check blood work today.  Please keep an eye on your blood pressure and let me know if it is persistently 140/90 or higher.  Please continue working on diet and exercise.  We will see you back in 1 year for your next physical.  Come back to see [place] sooner if needed.  Take care, Dr Jerline Pain  PLEASE NOTE:  If you had any lab tests please let us know if you have not heard back within a few days. You may see your results on mychart before we have a chance to review them but we will give you a call once they are reviewed by [place]. If we ordered any referrals today, please let us know if you have not heard from their office within the next week.   Please try these tips to maintain a healthy lifestyle:  Eat at least 3 REAL meals and 1-2 snacks per day.  Aim for no more than 5 hours between eating.  If you eat breakfast, please do so within one hour of getting up.   Each meal should contain half fruits/vegetables, one quarter protein, and one quarter carbs (no bigger than a computer mouse)  Cut down on sweet beverages. This includes juice, soda, and sweet tea.   Drink at least 1 glass of water with each meal and aim for at least 8 glasses per day  Exercise at least 150 minutes every week.    Preventive Care 23-64 Years Old, Male Preventive care refers to lifestyle choices and visits with your health care provider that can promote health and wellness. This includes: A yearly physical exam. This is also called an annual wellness visit. Regular dental and eye exams. Immunizations. Screening for certain conditions. Healthy lifestyle choices, such as: Eating a healthy diet. Getting regular exercise. Not using drugs or products that contain nicotine and tobacco. Limiting alcohol use. What can I expect for my preventive care visit? Physical exam Your health care provider will check your: Height and weight. These may be used to calculate your BMI (body  mass index). BMI is a measurement that tells if you are at a healthy weight. Heart rate and blood pressure. Body temperature. Skin for abnormal spots. Counseling Your health care provider may ask you questions about your: Past medical problems. Family's medical history. Alcohol, tobacco, and drug use. Emotional well-being. Home life and relationship well-being. Sexual activity. Diet, exercise, and sleep habits. Work and work Statistician. Access to firearms. What immunizations do I need? Vaccines are usually given at various ages, according to a schedule. Your health care provider will recommend vaccines for you based on your age, medical history, and lifestyle or other factors, such as travel or where you work. What tests do I need? Blood tests Lipid and cholesterol levels. These may be checked every 5 years, or more often if you are over 17 years old. Hepatitis C test. Hepatitis B test. Screening Lung cancer screening. You may have this screening every year starting at age 55 if you have a 30-pack-year history of smoking and currently smoke or have quit within the past 15 years. Prostate cancer screening. Recommendations will vary depending on your family history and other risks. Genital exam to check for testicular cancer or hernias. Colorectal cancer screening. All adults should have this screening starting at age 58 and continuing until age 70. Your health care provider may recommend screening at age 21 if you are at increased risk. You  will have tests every 1-10 years, depending on your results and the type of screening test. Diabetes screening. This is done by checking your blood sugar (glucose) after you have not eaten for a while (fasting). You may have this done every 1-3 years. STD (sexually transmitted disease) testing, if you are at risk. Follow these instructions at home: Eating and drinking  Eat a diet that includes fresh fruits and vegetables, whole grains, lean  protein, and low-fat dairy products. Take vitamin and mineral supplements as recommended by your health care provider. Do not drink alcohol if your health care provider tells you not to drink. If you drink alcohol: Limit how much you have to 0-2 drinks a day. Be aware of how much alcohol is in your drink. In the [place] one drink equals one 12 oz bottle of beer (355 mL), one 5 oz glass of wine (148 mL), or one 1 oz glass of hard liquor (44 mL). Lifestyle Take daily care of your teeth and gums. Brush your teeth every morning and night with fluoride toothpaste. Floss one time each day. Stay active. Exercise for at least 30 minutes 5 or more days each week. Do not use any products that contain nicotine or tobacco, such as cigarettes, e-cigarettes, and chewing tobacco. If you need help quitting, ask your health care provider. Do not use drugs. If you are sexually active, practice safe sex. Use a condom or other form of protection to prevent STIs (sexually transmitted infections). If told by your health care provider, take low-dose aspirin daily starting at age 33. Find healthy ways to cope with stress, such as: Meditation, yoga, or listening to music. Journaling. Talking to a trusted person. Spending time with friends and family. Safety Always wear your seat belt while driving or riding in a vehicle. Do not drive: If you have been drinking alcohol. Do not ride with someone who has been drinking. When you are tired or distracted. While texting. Wear a helmet and other protective equipment during sports activities. If you have firearms in your house, make sure you follow all gun safety procedures. What's next? Go to your health care provider once a year for an annual wellness visit. Ask your health care provider how often you should have your eyes and teeth checked. Stay up to date on all vaccines. This information is not intended to replace advice given to you by your health care provider.  Make sure you discuss any questions you have with your health care provider. Document Revised: 04/18/2020 Document Reviewed: 02/02/2018 Elsevier Patient Education  2022 [employer].

## 2020-12-09 NOTE — Assessment & Plan Note (Addendum)
Did not tolerate lipitor due to myalgias.  We will check lipids today.  May start simvastatin if he needs a statin depending on results.  Discussed lifestyle modifications.

## 2020-12-09 NOTE — Assessment & Plan Note (Signed)
Stable.  Continue management per GI.

## 2020-12-09 NOTE — Assessment & Plan Note (Signed)
Stable.  On omeprazole per GI.

## 2020-12-10 NOTE — Progress Notes (Signed)
Please inform patient of the following:  His cholesterol is up a bit since last time and his blood sugar is borderline. We do not need to start any medications right now but he should work on diet and exercise and we can recheck in a year or so.  Johnny [name]. Jerline Pain, MD 12/10/2020 7:54 AM

## 2021-01-03 ENCOUNTER — Other Ambulatory Visit: Payer: Self-pay | Admitting: Family Medicine

## 2021-01-06 NOTE — Progress Notes (Signed)
   Johnny [name] D.[place] Phone: [phone]   Assessment and Plan:     1. Sacroiliac pain 2. Somatic dysfunction of cervical region 3. Somatic dysfunction of thoracic region 4. Somatic dysfunction of lumbar region 5. Somatic dysfunction of pelvic region 6. Somatic dysfunction of sacral region -Chronic with exacerbation, subsequent visit - Recurrence of typical musculoskeletal complaints with most prominent being low back and SI joints - Continue stretching routine and start low back strengthening HEP - Patient has received significant relief with OMT in the past.  Elects for repeat OMT today.  Tolerated well per note below. - Decision today to treat with OMT was based on Physical Exam   After verbal consent patient was treated with HVLA (high velocity low amplitude), ME (muscle energy), FPR (flex positional release), ST (soft tissue), PC/PD (Pelvic Compression/ Pelvic Decompression) techniques in cervical, sacrum, thoracic, lumbar, and pelvic areas. Patient tolerated the procedure well with improvement in symptoms.  Patient educated on potential side effects of soreness and recommended to rest, hydrate, and use Tylenol as needed for pain control.   Pertinent previous records reviewed include none   Follow Up: 4 to 6 weeks for repeat OMT   Subjective:   I, Johnny [name], am serving as a scribe for Dr. Glennon Mac  Chief Complaint: Neck and back pain   HPI:   01/07/21 Patient is a 40 year old male presenting with neck and back pain. Patient was last seen by Dr. Tamala Julian on 12/03/20 for this reason and had OMT. Today patient states that his mid lower back is the area of tightness and pain that he usually has.   Relevant Historical Information: Hypertension, Crohn's disease  Additional pertinent review of systems negative.  Current Outpatient Medications  Medication Sig Dispense Refill   amLODipine (NORVASC) 5 MG  tablet TAKE 1 TABLET (5 MG TOTAL) BY MOUTH DAILY. 90 tablet 1   betamethasone valerate ointment (VALISONE) 0.1 % Apply 1 application topically 2 (two) times daily. 30 g 0   gabapentin (NEURONTIN) 400 MG capsule TAKE 1 CAPSULE (400 MG TOTAL) BY MOUTH AT BEDTIME. 90 capsule 1   mesalamine (LIALDA) 1.2 g EC tablet Take by mouth.     omeprazole (PRILOSEC) 40 MG capsule Take 1 capsule by mouth every morning.     tiZANidine (ZANAFLEX) 2 MG tablet TAKE 1 TABLET BY MOUTH EVERYDAY AT BEDTIME 90 tablet 1   triamcinolone cream (KENALOG) 0.5 % Apply 1 application topically 3 (three) times daily. 30 g 0   Vitamin D, Ergocalciferol, (DRISDOL) 1.25 MG (50000 UNIT) CAPS capsule TAKE 1 CAPSULE (50,000 UNITS TOTAL) BY MOUTH EVERY 7 (SEVEN) DAYS 12 capsule 0   No current facility-administered medications for this visit.      Objective:     Vitals:   01/07/21 1131  BP: 118/84  Pulse: (!) 58  SpO2: 99%  Weight: 234 lb (106.1 kg)  Height: 6' 4"  (1.93 m)      Body mass index is 28.48 kg/m.    Physical Exam:     General: Well-appearing, cooperative, sitting comfortably in no acute distress.   OMT Physical Exam:  ASIS Compression Test: Positive Right Cervical: TTP paraspinal, C3 RLSR, C5 RRSL Sacrum: Negative sphinx, right on right Thoracic: TTP paraspinal, T6-8 RRSL Lumbar: TTP paraspinal, L2 RRSR Pelvis: Right anterior innominate  Electronically signed by:  Johnny [name] D.[facility] 11:50 AM 01/07/21

## 2021-01-07 ENCOUNTER — Ambulatory Visit: Payer: No Typology Code available for payment source | Admitting: Family Medicine

## 2021-01-07 ENCOUNTER — Other Ambulatory Visit: Payer: Self-pay

## 2021-01-07 ENCOUNTER — Ambulatory Visit: Payer: No Typology Code available for payment source | Admitting: Sports Medicine

## 2021-01-07 VITALS — BP 118/84 | HR 58 | Ht 76.0 in | Wt 234.0 lb

## 2021-01-07 DIAGNOSIS — M9902 Segmental and somatic dysfunction of thoracic region: Secondary | ICD-10-CM

## 2021-01-07 DIAGNOSIS — M9903 Segmental and somatic dysfunction of lumbar region: Secondary | ICD-10-CM | POA: Diagnosis not present

## 2021-01-07 DIAGNOSIS — M9901 Segmental and somatic dysfunction of cervical region: Secondary | ICD-10-CM | POA: Diagnosis not present

## 2021-01-07 DIAGNOSIS — M533 Sacrococcygeal disorders, not elsewhere classified: Secondary | ICD-10-CM | POA: Diagnosis not present

## 2021-01-07 DIAGNOSIS — M9904 Segmental and somatic dysfunction of sacral region: Secondary | ICD-10-CM

## 2021-01-07 DIAGNOSIS — M9905 Segmental and somatic dysfunction of pelvic region: Secondary | ICD-10-CM

## 2021-01-07 NOTE — Patient Instructions (Signed)
Good to see you   Follow up in 4-6 weeks for repeat OMT

## 2021-02-18 ENCOUNTER — Other Ambulatory Visit: Payer: Self-pay | Admitting: Family Medicine

## 2021-02-18 ENCOUNTER — Ambulatory Visit: Payer: No Typology Code available for payment source | Admitting: Family Medicine

## 2021-02-18 ENCOUNTER — Other Ambulatory Visit: Payer: Self-pay

## 2021-02-18 VITALS — BP 130/86 | HR 78 | Ht 76.0 in | Wt 233.0 lb

## 2021-02-18 DIAGNOSIS — M9903 Segmental and somatic dysfunction of lumbar region: Secondary | ICD-10-CM | POA: Diagnosis not present

## 2021-02-18 DIAGNOSIS — G8929 Other chronic pain: Secondary | ICD-10-CM

## 2021-02-18 DIAGNOSIS — M9904 Segmental and somatic dysfunction of sacral region: Secondary | ICD-10-CM | POA: Diagnosis not present

## 2021-02-18 DIAGNOSIS — M999 Biomechanical lesion, unspecified: Secondary | ICD-10-CM

## 2021-02-18 DIAGNOSIS — M545 Low back pain, unspecified: Secondary | ICD-10-CM | POA: Diagnosis not present

## 2021-02-18 DIAGNOSIS — M9902 Segmental and somatic dysfunction of thoracic region: Secondary | ICD-10-CM | POA: Diagnosis not present

## 2021-02-18 DIAGNOSIS — M9908 Segmental and somatic dysfunction of rib cage: Secondary | ICD-10-CM

## 2021-02-18 DIAGNOSIS — M9901 Segmental and somatic dysfunction of cervical region: Secondary | ICD-10-CM | POA: Diagnosis not present

## 2021-02-18 NOTE — Assessment & Plan Note (Signed)
Pain seems to be more in the thoracolumbar area.  Patient is on the Zanaflex.  Discussed icing regimen and home exercises.  Discussed avoiding certain activities otherwise.  Follow-up again in 6 to 8 weeks.  Patient sees a chiropractor intermittently

## 2021-02-18 NOTE — Assessment & Plan Note (Signed)

## 2021-02-18 NOTE — Patient Instructions (Signed)
Good to see you! Good luck with the loft bed See you again in 6-8 weeks

## 2021-02-18 NOTE — Progress Notes (Signed)
[facility] [place] [address] [place] Phone: [phone] Subjective:   [name], am serving as a scribe for Dr. Hulan Saas. This visit occurred during the SARS-CoV-2 public health emergency.  Safety protocols were in place, including screening questions prior to the visit, additional usage of staff PPE, and extensive cleaning of exam room while observing appropriate contact time as indicated for disinfecting solutions.   I'm seeing this patient by the request  of:  Vivi Barrack, MD  CC: Low back pain follow-up  [id number]  CLANCEY WELTON is a 40 y.o. male coming in with complaint of low back pain.  We have seen patient numerous occasions.  Has responded relatively well to osteopathic manipulation.  On gabapentin 400 mg.  Also has been taking Zanaflex from time to time.  Patient states everything is the same. More mid back than low back. No new complaints.   Last epidural for the back was June 2022.    Past Medical History:  Diagnosis Date   Crohn's disease ([place])    GERD (gastroesophageal reflux disease)    Hyperlipidemia    Hypertension    Past Surgical History:  Procedure Laterality Date   LIVER BIOPSY     Social History   Socioeconomic History   Marital status: Married    Spouse name: Not on file   Number of children: Not on file   Years of education: Not on file   Highest education level: Not on file  Occupational History   Not on file  Tobacco Use   Smoking status: Never   Smokeless tobacco: Never  Vaping Use   Vaping Use: Never used  Substance and Sexual Activity   Alcohol use: No   Drug use: No   Sexual activity: Yes  Other Topics Concern   Not on file  Social History Narrative   Not on file   Social Determinants of Health   Financial Resource Strain: Not on file  Food Insecurity: Not on file  Transportation Needs: Not on file  Physical Activity: Not on file  Stress: Not on file  Social  Connections: Not on file   Allergies  Allergen Reactions   Other Diarrhea and Nausea And Vomiting    Champagne    Codeine Nausea Only   No family history on file.   Current Outpatient Medications (Cardiovascular):    amLODipine (NORVASC) 5 MG tablet, TAKE 1 TABLET (5 MG TOTAL) BY MOUTH DAILY.     Current Outpatient Medications (Other):    betamethasone valerate ointment (VALISONE) 0.1 %, Apply 1 application topically 2 (two) times daily.   gabapentin (NEURONTIN) 400 MG capsule, TAKE 1 CAPSULE (400 MG TOTAL) BY MOUTH AT BEDTIME.   mesalamine (LIALDA) 1.2 g EC tablet, Take by mouth.   omeprazole (PRILOSEC) 40 MG capsule, Take 1 capsule by mouth every morning.   tiZANidine (ZANAFLEX) 2 MG tablet, TAKE 1 TABLET BY MOUTH EVERYDAY AT BEDTIME   triamcinolone cream (KENALOG) 0.5 %, Apply 1 application topically 3 (three) times daily.   Vitamin D, Ergocalciferol, (DRISDOL) 1.25 MG (50000 UNIT) CAPS capsule, TAKE 1 CAPSULE (50,000 UNITS TOTAL) BY MOUTH EVERY 7 (SEVEN) DAYS   Reviewed prior external information including notes and imaging from  primary care provider reviewed patient's most recent office visit in October with primary care provider As well as notes that were available from care everywhere and other healthcare systems.  Reviewed patient's chart from [facility] for his Crohn's disease.  Also  reviewed patient's chart with Dr. Glennon Mac for manipulation on 16 November.  Past medical history, social, surgical and family history all reviewed in electronic medical record.  No pertanent information unless stated regarding to the chief complaint.   Review of Systems:  No headache, visual changes, nausea, vomiting, diarrhea, constipation, dizziness, abdominal pain, skin rash, fevers, chills, night sweats, weight loss, swollen lymph nodes, body aches, joint swelling, chest pain, shortness of breath, mood changes. POSITIVE muscle aches  Objective  Blood pressure 130/86, pulse 78, height 6' 4"   (1.93 m), weight 233 lb (105.7 kg), SpO2 99 %.   General: No apparent distress alert and oriented x3 mood and affect normal, dressed appropriately.  HEENT: Pupils equal, extraocular movements intact  Respiratory: Patient's speak in full sentences and does not appear short of breath  Cardiovascular: No lower extremity edema, non tender, no erythema  Gait normal with good balance and coordination.  MSK: Low back exam shows tenderness to palpation of the thoracolumbar juncture.  Still seems to be more right greater than left.  Negative straight leg test.  Worsening pain with extension of the back greater than 20 degrees.  Patient does have some tightness with flexion as well.  Negative Faber.  Osteopathic findings  C6 flexed rotated and side bent left T3 extended rotated and side bent right inhaled third rib T11 extended rotated and side bent right L1 flexed rotated and side bent right Sacrum right on right     Impression and Recommendations:     The above documentation has been reviewed and is accurate and complete [name], DO

## 2021-03-05 ENCOUNTER — Other Ambulatory Visit: Payer: Self-pay | Admitting: Family Medicine

## 2021-04-01 NOTE — Progress Notes (Signed)
[name] [address] [place] Phone: [phone] Subjective:   [name], am serving as a scribe for Dr. Hulan Saas. This visit occurred during the SARS-CoV-2 public health emergency.  Safety protocols were in place, including screening questions prior to the visit, additional usage of staff PPE, and extensive cleaning of exam room while observing appropriate contact time as indicated for disinfecting solutions.   I'm seeing this patient by the request  of:  Vivi Barrack, MD  CC: Low back pain     [id number]  Johnny [name] is a 41 y.o. male coming in with complaint of back and neck pain. OMT 02/18/2021. Patient states same as usual. Wants to know if he can get xrays further up. No other complaints.  Medications patient has been prescribed: Vit D, Gabapentin  Taking:         Reviewed prior external information including notes and imaging from previsou exam, outside providers and external EMR if available.   As well as notes that were available from care everywhere and other healthcare systems.  Past medical history, social, surgical and family history all reviewed in electronic medical record.  No pertanent information unless stated regarding to the chief complaint.   Past Medical History:  Diagnosis Date   Crohn's disease ([place])    GERD (gastroesophageal reflux disease)    Hyperlipidemia    Hypertension     Allergies  Allergen Reactions   Other Diarrhea and Nausea And Vomiting    Champagne    Codeine Nausea Only     Review of Systems:  No headache, visual changes, nausea, vomiting, diarrhea, constipation, dizziness, abdominal pain, skin rash, fevers, chills, night sweats, weight loss, swollen lymph nodes, body aches, joint swelling, chest pain, shortness of breath, mood changes. POSITIVE muscle aches  Objective  Blood pressure 132/90, pulse 80, height 6' 4"  (1.93 m), weight 235 lb (106.6 kg), SpO2  99 %.   General: No apparent distress alert and oriented x3 mood and affect normal, dressed appropriately.  HEENT: Pupils equal, extraocular movements intact  Respiratory: Patient's speak in full sentences and does not appear short of breath  Cardiovascular: No lower extremity edema, non tender, no erythema  Low back exam versus muscle lordosis.  Impression Tenderness to palpation in the paraspinal musculature.  No symptoms to be right greater than left.  More pain in the L1-L3 on the right side than usual tightness of the hip flexor noted as well..  Mild tenderness of the right sacroiliac joint  Osteopathic findings  C2 flexed rotated and side bent right C6 flexed rotated and side bent left T3 extended rotated and side bent right inhaled rib T9 extended rotated and side bent left L2 flexed rotated and side bent right L3 flexed rotated and side bent right  L5 flexed rotated Left Sacrum right on right       Assessment and Plan:  Low back pain Continue same.  Does have the muscle relaxer.  We discussed with patient icing regimen and home exercises.  Continue to stay active otherwise.  Follow-up with me again in 6 to 8 weeks.   Nonallopathic problems  Decision today to treat with OMT was based on Physical Exam  After verbal consent patient was treated with HVLA, ME, FPR techniques in cervical, rib, thoracic, lumbar, and sacral  areas  Patient tolerated the procedure well with improvement in symptoms  Patient given exercises, stretches and lifestyle modifications  See medications in patient  instructions if given  Patient will follow up in 4-8 weeks      The above documentation has been reviewed and is accurate and complete [name], DO        Note: This dictation was prepared with Dragon dictation along with smaller phrase technology. Any transcriptional errors that result from this process are unintentional.

## 2021-04-02 ENCOUNTER — Ambulatory Visit: Payer: No Typology Code available for payment source | Admitting: Family Medicine

## 2021-04-02 ENCOUNTER — Other Ambulatory Visit: Payer: Self-pay

## 2021-04-02 VITALS — BP 132/90 | HR 80 | Ht 76.0 in | Wt 235.0 lb

## 2021-04-02 DIAGNOSIS — M9903 Segmental and somatic dysfunction of lumbar region: Secondary | ICD-10-CM | POA: Diagnosis not present

## 2021-04-02 DIAGNOSIS — M9901 Segmental and somatic dysfunction of cervical region: Secondary | ICD-10-CM

## 2021-04-02 DIAGNOSIS — G8929 Other chronic pain: Secondary | ICD-10-CM

## 2021-04-02 DIAGNOSIS — M9904 Segmental and somatic dysfunction of sacral region: Secondary | ICD-10-CM | POA: Diagnosis not present

## 2021-04-02 DIAGNOSIS — M545 Low back pain, unspecified: Secondary | ICD-10-CM

## 2021-04-02 DIAGNOSIS — M9902 Segmental and somatic dysfunction of thoracic region: Secondary | ICD-10-CM

## 2021-04-02 DIAGNOSIS — M9908 Segmental and somatic dysfunction of rib cage: Secondary | ICD-10-CM

## 2021-04-02 NOTE — Assessment & Plan Note (Signed)
Continue same.  Does have the muscle relaxer.  We discussed with patient icing regimen and home exercises.  Continue to stay active otherwise.  Follow-up with me again in 6 to 8 weeks.

## 2021-04-02 NOTE — Patient Instructions (Signed)
Good to see you! Looking for upset in [employer] game See you again in 6 weeks

## 2021-04-14 ENCOUNTER — Other Ambulatory Visit: Payer: Self-pay | Admitting: Family Medicine

## 2021-05-13 NOTE — Progress Notes (Signed)
?Johnny [name] D.O. ?Johnny [name] ?Johnny [name] ?Phone: [phone] ?Subjective:   ?I, Johnny [name], am serving as a scribe for Dr. Hulan [name]. ? ?This visit occurred during the SARS-CoV-2 public health emergency.  Safety protocols were in place, including screening questions prior to the visit, additional usage of staff PPE, and extensive cleaning of exam room while observing appropriate contact time as indicated for disinfecting solutions.  ?I'm seeing this patient by the request  of:  Johnny Barrack, MD ? ?CC: back and neck pain  ? ?[id number]  ?Johnny [name] is a 41 y.o. male coming in with complaint of back and neck pain. OMT 04/02/2021.  Patient states that he is the same as last visit. Take Zanaflex QHS.  ? ?Medications patient has been prescribed: Vit D, Zanaflex ? ?Taking: ? ? ?  ? ? ? ? ?Reviewed prior external information including notes and imaging from previsou exam, outside providers and external EMR if available.  ? ?As well as notes that were available from care everywhere and other healthcare systems. ? ?Past medical history, social, surgical and family history all reviewed in electronic medical record.  No pertanent information unless stated regarding to the chief complaint.  ? ?Past Medical History:  ?Diagnosis Date  ? Crohn's disease ([place])   ? GERD (gastroesophageal reflux disease)   ? Hyperlipidemia   ? Hypertension   ?  ?Allergies  ?Allergen Reactions  ? Other Diarrhea and Nausea And Vomiting  ?  Champagne   ? Codeine Nausea Only  ? ? ? ?Review of Systems: ? No headache, visual changes, nausea, vomiting, diarrhea, constipation, dizziness, abdominal pain, skin rash, fevers, chills, night sweats, weight loss, swollen lymph nodes, body aches, joint swelling, chest pain, shortness of breath, mood changes. POSITIVE muscle aches ? ?Objective  ?Blood pressure 120/82, pulse 72, height 6' 4"  (1.93 m), weight 236 lb (107 kg), SpO2 99 %. ?  ?General: No apparent  distress alert and oriented x3 mood and affect normal, dressed appropriately.  ?HEENT: Pupils equal, extraocular movements intact  ?Respiratory: Patient's speak in full sentences and does not appear short of breath  ?Cardiovascular: No lower extremity edema, non tender, no erythema  ?Back exam shows mild loss lordosis  ?Tightness FABER  ? ?Osteopathic findings ? ?C7 flexed rotated and side bent right  ?T3 extended rotated and side bent right inhaled rib ?T5 extended rotated and side bent left ?L2 flexed rotated and side bent right ?L5 flexed rotated and side bent left ?Sacrum right on right ? ? ?  ?Assessment and Plan: ? ?Low back pain ?Low back exam  ?Does have some loss lordosis.  Still doing relatively well at the moment though.  Does have the gabapentin if needed.  Has had chronic pain with exacerbation.  Does have a muscle relaxer for breakthrough as well.  Follow-up again in 6 to 8 weeks ?  ? ?Nonallopathic problems ? ?Decision today to treat with OMT was based on Physical Exam ? ?After verbal consent patient was treated with HVLA, ME, FPR techniques in cervical, rib, thoracic, lumbar, and sacral  areas ? ?Patient tolerated the procedure well with improvement in symptoms ? ?Patient given exercises, stretches and lifestyle modifications ? ?See medications in patient instructions if given ? ?Patient will follow up in 4-8 weeks ? ?  ? ?The above documentation has been reviewed and is accurate and complete Johnny Pulley, DO ? ? ? ?  ? ? Note: This dictation was prepared  with Dragon dictation along with smaller phrase technology. Any transcriptional errors that result from this process are unintentional.    ?  ?  ? ?

## 2021-05-14 ENCOUNTER — Ambulatory Visit: Payer: No Typology Code available for payment source | Admitting: Family Medicine

## 2021-05-14 ENCOUNTER — Other Ambulatory Visit: Payer: Self-pay

## 2021-05-14 VITALS — BP 120/82 | HR 72 | Ht 76.0 in | Wt 236.0 lb

## 2021-05-14 DIAGNOSIS — M9904 Segmental and somatic dysfunction of sacral region: Secondary | ICD-10-CM

## 2021-05-14 DIAGNOSIS — M9902 Segmental and somatic dysfunction of thoracic region: Secondary | ICD-10-CM

## 2021-05-14 DIAGNOSIS — M9903 Segmental and somatic dysfunction of lumbar region: Secondary | ICD-10-CM | POA: Diagnosis not present

## 2021-05-14 DIAGNOSIS — M545 Low back pain, unspecified: Secondary | ICD-10-CM | POA: Diagnosis not present

## 2021-05-14 DIAGNOSIS — M9908 Segmental and somatic dysfunction of rib cage: Secondary | ICD-10-CM

## 2021-05-14 DIAGNOSIS — G8929 Other chronic pain: Secondary | ICD-10-CM

## 2021-05-14 DIAGNOSIS — M9901 Segmental and somatic dysfunction of cervical region: Secondary | ICD-10-CM | POA: Diagnosis not present

## 2021-05-14 NOTE — Patient Instructions (Signed)
See me again in 6-8 weeks ?

## 2021-05-14 NOTE — Assessment & Plan Note (Signed)
Low back exam  ?Does have some loss lordosis.  Still doing relatively well at the moment though.  Does have the gabapentin if needed.  Has had chronic pain with exacerbation.  Does have a muscle relaxer for breakthrough as well.  Follow-up again in 6 to 8 weeks ?

## 2021-05-27 ENCOUNTER — Other Ambulatory Visit: Payer: Self-pay | Admitting: Family Medicine

## 2021-07-01 NOTE — Progress Notes (Signed)
?Charlann Boxer D.O. ?[facility] ?[place] ?Phone: [phone] ?Subjective:   ?I, Johnny [name], am serving as a Education administrator for Dr. Hulan Saas. ?This visit occurred during the SARS-CoV-2 public health emergency.  Safety protocols were in place, including screening questions prior to the visit, additional usage of staff PPE, and extensive cleaning of exam room while observing appropriate contact time as indicated for disinfecting solutions.  ? ?I'm seeing this patient by the request  of:  Vivi Barrack, MD ? ?CC: back and neck pain  ? ?[id number]  ?Johnny [name] is a 41 y.o. male coming in with complaint of back and neck pain. OMT on 3/23/203. Patient states same per usual. No new complaints. ? ?Medications patient has been prescribed: Zanaflex VitD ? ?Taking: ? ? ?  ? ? ? ? ?Reviewed prior external information including notes and imaging from previsou exam, outside providers and external EMR if available.  ? ?As well as notes that were available from care everywhere and other healthcare systems. ? ?Past medical history, social, surgical and family history all reviewed in electronic medical record.  No pertanent information unless stated regarding to the chief complaint.  ? ?Past Medical History:  ?Diagnosis Date  ? Crohn's disease ([place])   ? GERD (gastroesophageal reflux disease)   ? Hyperlipidemia   ? Hypertension   ?  ?Allergies  ?Allergen Reactions  ? Other Diarrhea and Nausea And Vomiting  ?  Champagne   ? Codeine Nausea Only  ? ? ? ?Review of Systems: ? No headache, visual changes, nausea, vomiting, diarrhea, constipation, dizziness, abdominal pain, skin rash, fevers, chills, night sweats, weight loss, swollen lymph nodes, body aches, joint swelling, chest pain, shortness of breath, mood changes. POSITIVE muscle aches ? ?Objective  ?Blood pressure 122/82, pulse 81, height 6' 4"  (1.93 m), weight 237 lb (107.5 kg), SpO2 98 %. ?  ?General: No apparent distress alert and  oriented x3 mood and affect normal, dressed appropriately.  ?HEENT: Pupils equal, extraocular movements intact  ?Back exam does have some mild loss of lordosis and some tenderness to palpation.  Patient has worsening pain with extension and flexion of the low back than usual.  Tightness noted with straight leg test.  No true radicular symptoms with patient is more uncomfortable than usual. ? ?Osteopathic findings ? ?C2 flexed rotated and side bent right ?C7 flexed rotated and side bent left ?T3 extended rotated and side bent right inhaled rib ?T6 extended rotated and side bent left ?L2 flexed rotated and side bent right ?Sacrum left on left ? ? ?  ?Assessment and Plan: ? ?Low back pain ?Chronic problem recommend continuing to have difficulty.  Patient feels like the pain seems to be worsening overall.  Wondering to know if a possible MRI would be more beneficial.  Patient did have some mild L5-S1 disc protrusion that could have been contributing but did not respond long-term to the epidurals.  Patient states that she has had multiple different cyst in different areas of his body and is concerned he may have 1 in the back.  We can get a another MRI to further evaluate.  Depending on that we will see if that changes medical management.  Continue the muscle relaxers and the gabapentin. ?  ? ?Nonallopathic problems ? ?Decision today to treat with OMT was based on Physical Exam ? ?After verbal consent patient was treated with HVLA, ME, FPR techniques in cervical, rib, thoracic, lumbar, and sacral  areas ? ?  Patient tolerated the procedure well with improvement in symptoms ? ?Patient given exercises, stretches and lifestyle modifications ? ?See medications in patient instructions if given ? ?Patient will follow up in 4-8 weeks ? ?  ? ?The above documentation has been reviewed and is accurate and complete [name], DO ? ? ? ?  ? ? Note: This dictation was prepared with Dragon dictation along with smaller phrase  technology. Any transcriptional errors that result from this process are unintentional.    ?  ?  ? ?

## 2021-07-02 ENCOUNTER — Ambulatory Visit: Payer: No Typology Code available for payment source | Admitting: Family Medicine

## 2021-07-02 VITALS — BP 122/82 | HR 81 | Ht 76.0 in | Wt 237.0 lb

## 2021-07-02 DIAGNOSIS — G8929 Other chronic pain: Secondary | ICD-10-CM

## 2021-07-02 DIAGNOSIS — M545 Low back pain, unspecified: Secondary | ICD-10-CM

## 2021-07-02 DIAGNOSIS — M9904 Segmental and somatic dysfunction of sacral region: Secondary | ICD-10-CM

## 2021-07-02 DIAGNOSIS — M9902 Segmental and somatic dysfunction of thoracic region: Secondary | ICD-10-CM | POA: Diagnosis not present

## 2021-07-02 DIAGNOSIS — M9908 Segmental and somatic dysfunction of rib cage: Secondary | ICD-10-CM | POA: Diagnosis not present

## 2021-07-02 DIAGNOSIS — M9903 Segmental and somatic dysfunction of lumbar region: Secondary | ICD-10-CM | POA: Diagnosis not present

## 2021-07-02 DIAGNOSIS — M9901 Segmental and somatic dysfunction of cervical region: Secondary | ICD-10-CM | POA: Diagnosis not present

## 2021-07-02 NOTE — Patient Instructions (Signed)
MRI lumbar [id number] ?See me again in 6-8 weeks ?

## 2021-07-02 NOTE — Assessment & Plan Note (Signed)
Chronic problem recommend continuing to have difficulty.  Patient feels like the pain seems to be worsening overall.  Wondering to know if a possible MRI would be more beneficial.  Patient did have some mild L5-S1 disc protrusion that could have been contributing but did not respond long-term to the epidurals.  Patient states that she has had multiple different cyst in different areas of his body and is concerned he may have 1 in the back.  We can get a another MRI to further evaluate.  Depending on that we will see if that changes medical management.  Continue the muscle relaxers and the gabapentin. ?

## 2021-07-15 ENCOUNTER — Ambulatory Visit
Admission: RE | Admit: 2021-07-15 | Discharge: 2021-07-15 | Disposition: A | Discharge status: Home / Self Care | Payer: No Typology Code available for payment source | Source: Ambulatory Visit | Attending: Family Medicine | Admitting: Family Medicine

## 2021-07-15 DIAGNOSIS — M545 Low back pain, unspecified: Secondary | ICD-10-CM

## 2021-07-15 IMAGING — MR MR LUMBAR SPINE W/O CM
4 of 5 series · 24 of 48 positions shown · non-contrast
Comparison: Lumbar spine MRI [DATE]. Lumbar spine radiographs
[DATE].

CLINICAL DATA: Provided history: Lumbar spine pain.
Spondyloarthropathy, lumbar spine; lumbar spine pain. Additional
history provided by scanning technologist: Patient reports sciatica
pain, left leg numbness, low back pain radiating to bilateral hips
and down lateral left thigh, muscle stiffness.

EXAM:
MRI LUMBAR SPINE WITHOUT CONTRAST
TECHNIQUE: Multiplanar, multisequence MR imaging of the lumbar spine was
performed. No intravenous contrast was administered.

[Series 5: T2 · sagittal · 4.0mm · 1.00mm/px · 6 of 16 slices shown (1 of 2)]
[im 1/16]
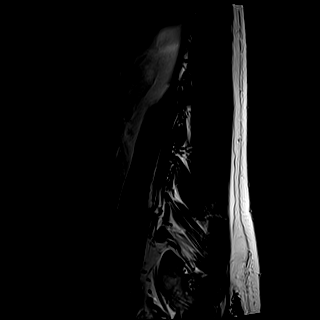
[im 4/16]
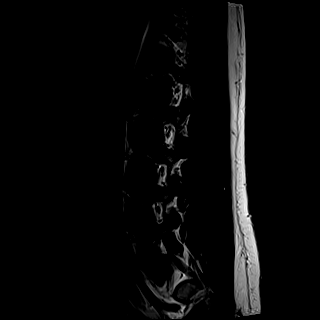
[im 7/16]
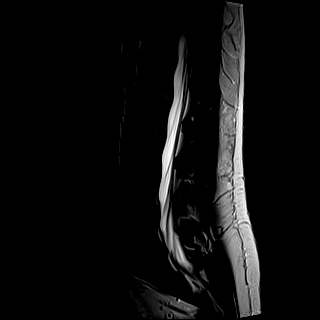
[im 10/16]
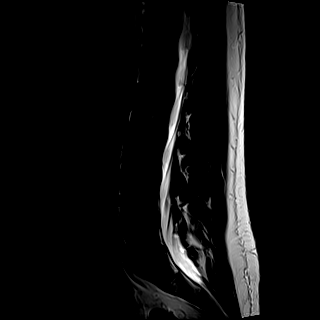
[im 13/16]
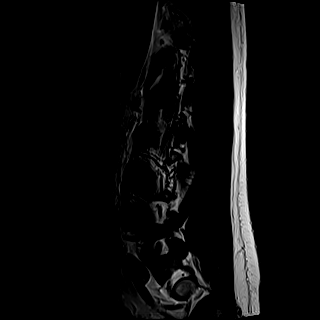
[im 16/16]
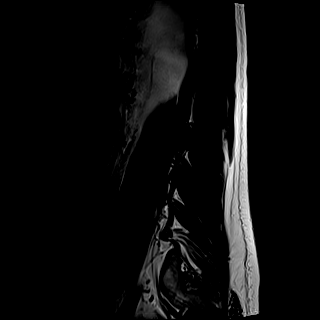

[Series 6: T1 · sagittal · 4.0mm · 1.00mm/px · 5 of 16 slices shown (1 of 2)]
[im 1/16]
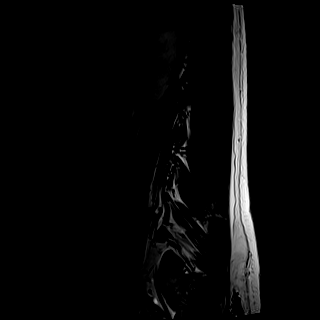
[im 4/16]
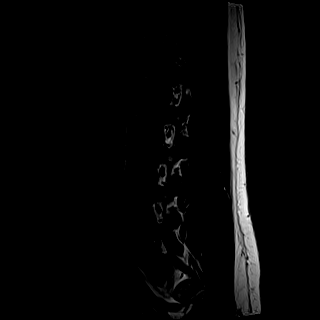
[im 8/16]
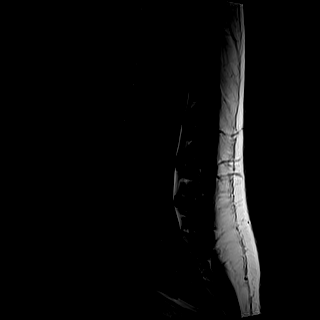
[im 12/16]
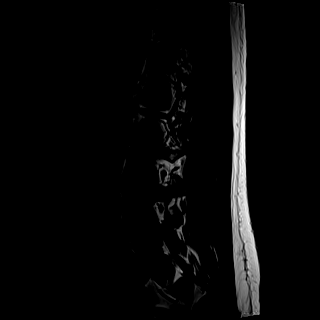
[im 16/16]
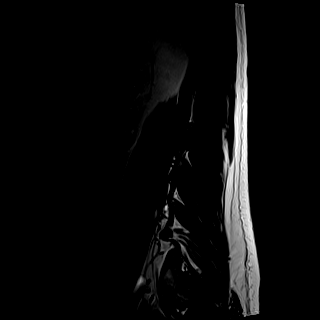

[Series 10: T1 · axial · 4.0mm · 0.35mm/px · z∈[-102,+90]mm · 3 of 46 slices shown (2 of 2)]
[im 7/46]
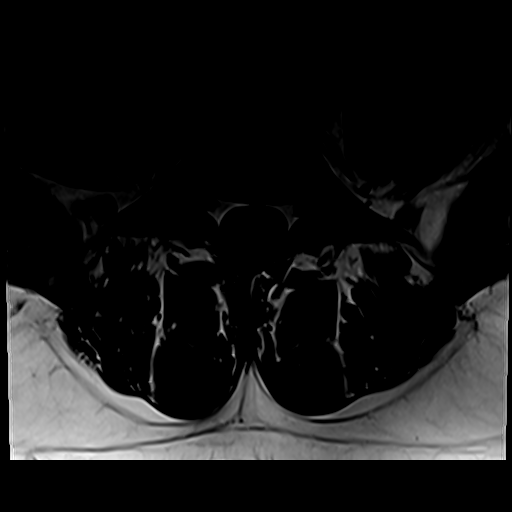
[im 25/46]
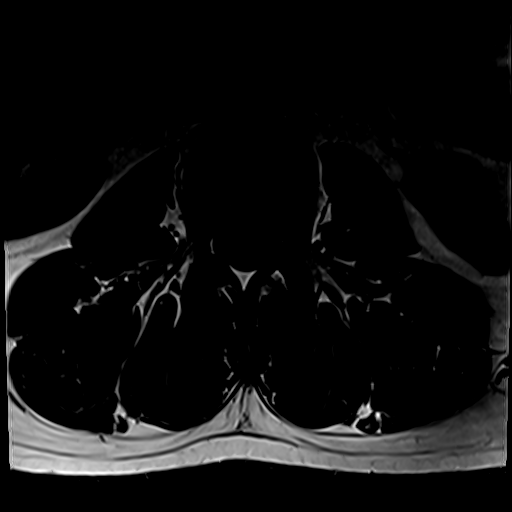
[im 40/46]
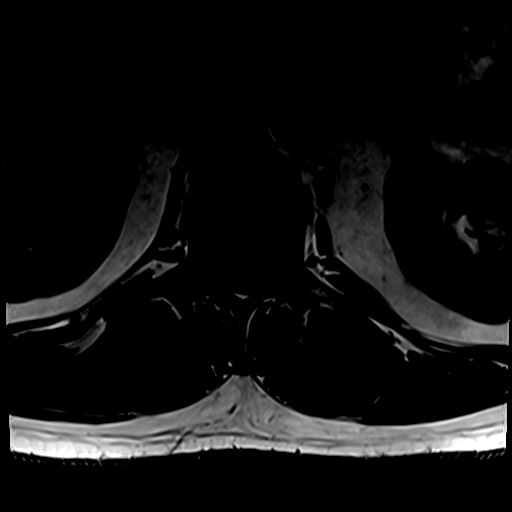

[Series 13: T2 · axial · 4.0mm · 0.35mm/px · z∈[-117,+119]mm · 10 of 46 slices shown (2 of 2)]
[im 4/46]
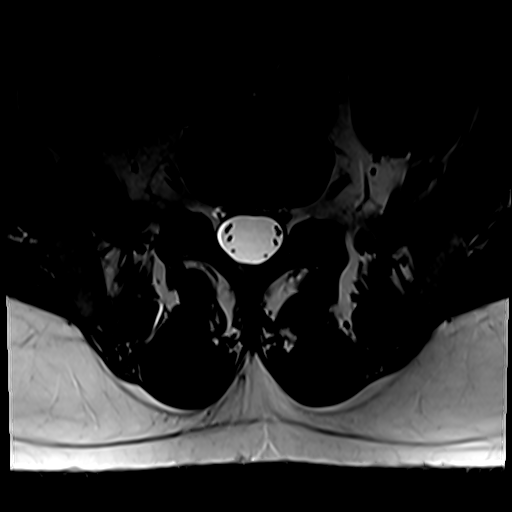
[im 7/46]
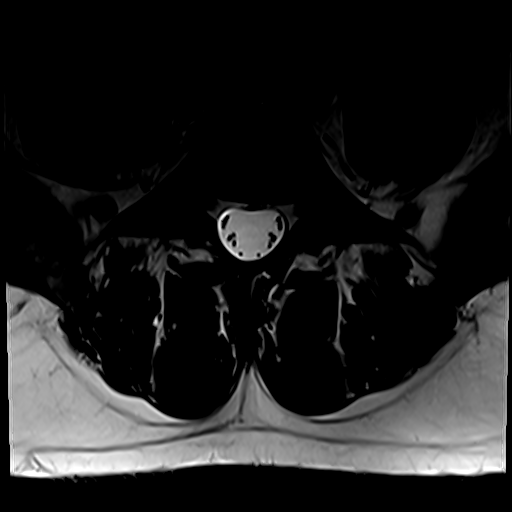
[im 10/46]
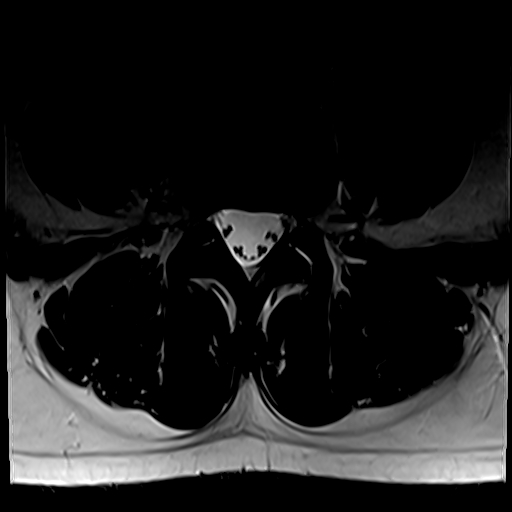
[im 16/46]
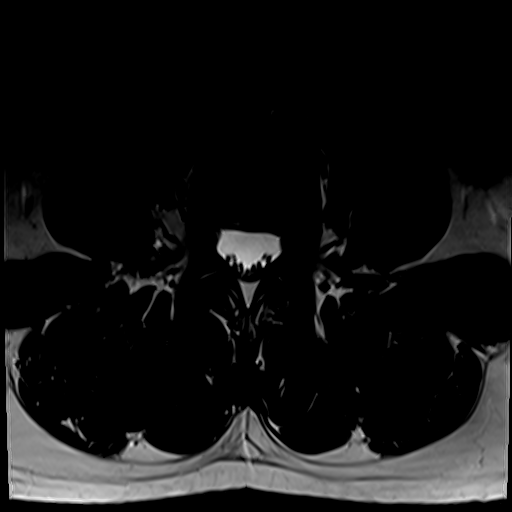
[im 22/46]
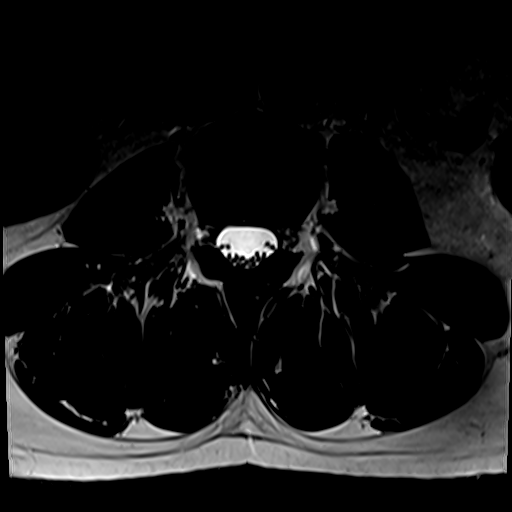
[im 25/46]
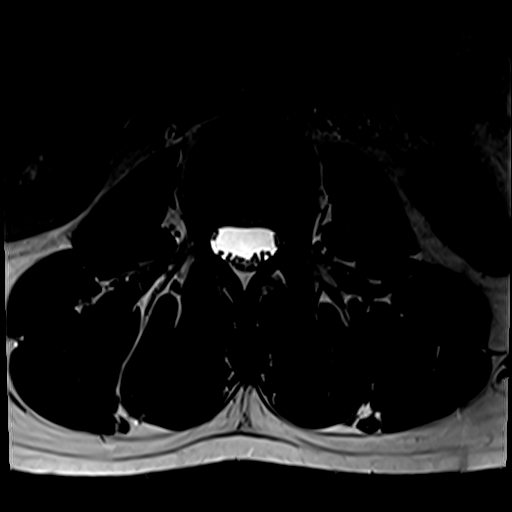
[im 28/46]
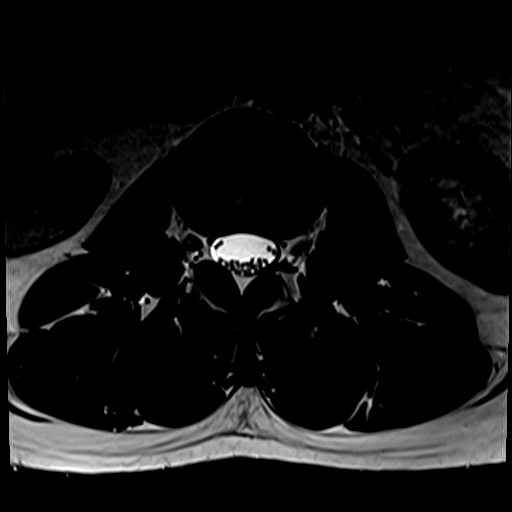
[im 34/46]
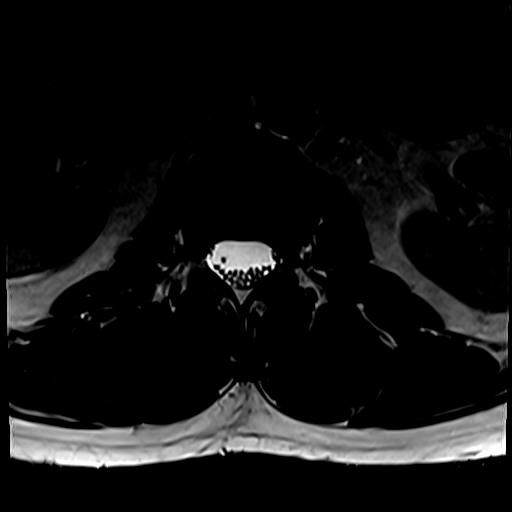
[im 40/46]
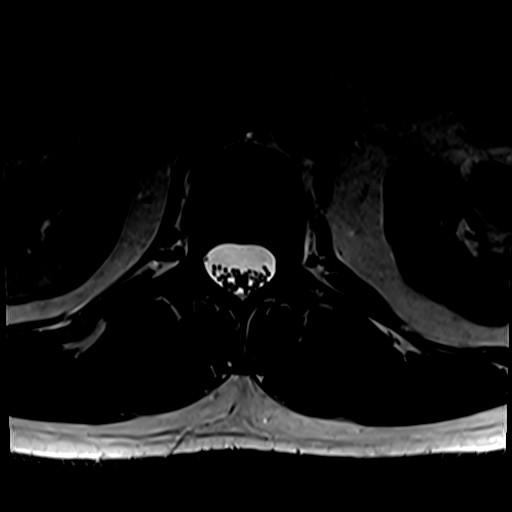
[im 46/46]
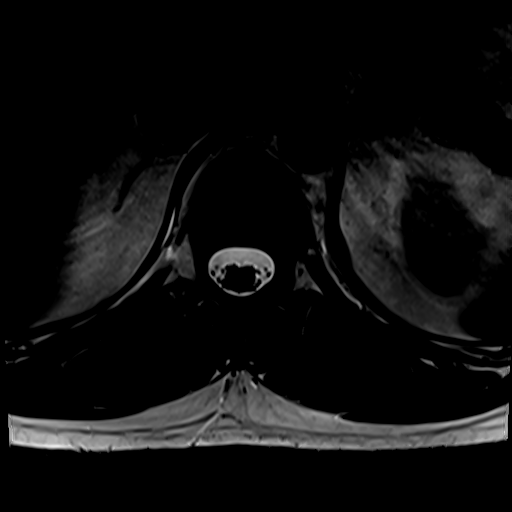

[24 of 48 positions shown; findings below may reference images not displayed]

FINDINGS: Segmentation: 5 lumbar vertebrae. The caudal most well-formed
intervertebral disc space is designated L5-S1.

Alignment:  No significant spondylolisthesis.

Vertebrae: No lumbar vertebral compression fracture. Small
multilevel Schmorl nodes. No significant marrow edema or focal
suspicious osseous lesion.

Conus medullaris and cauda equina: Conus extends to the L1-L2 level.
No signal abnormality within the visualized distal spinal cord.

Paraspinal and other soft tissues: No abnormality identified within
included portions of the abdomen/retroperitoneum. No paraspinal mass
or collection.

Disc levels:

Unless otherwise stated, the level by level findings below have not
significantly changed from the prior MRI of [DATE].

At L5-S1, there is mild disc degeneration. Redemonstrated shallow
broad-based central disc protrusion. New from the prior MRI, there
is a posterior annular fissure at site of this disc protrusion.
Minimal facet arthrosis. The disc protrusion minimally effaces the
ventral thecal sac, and may contact the descending right S1 nerve
root within the ventral aspect of the spinal canal. No significant
central canal stenosis or neural foraminal narrowing.

No significant disc degeneration, disc herniation, spinal canal
stenosis or neural foraminal narrowing at the remaining levels.
IMPRESSION: At L5-S1, there is mild disc degeneration. Redemonstrated shallow
broad-based central disc protrusion. New from the prior MRI of
[DATE], there is a posterior annular fissure at site of this
disc protrusion. Minimal facet arthrosis. The disc protrusion
minimally effaces the ventral thecal sac, and may contact the
descending right S1 nerve root within the ventral aspect of the
spinal canal. No significant central canal stenosis or neural
foraminal narrowing.

No significant disc degeneration, disc herniation, spinal canal
stenosis or neural foraminal narrowing at the remaining lumbar
levels.

## 2021-07-22 ENCOUNTER — Encounter: Payer: Self-pay | Admitting: Family Medicine

## 2021-07-22 ENCOUNTER — Other Ambulatory Visit: Payer: Self-pay

## 2021-07-22 DIAGNOSIS — M5416 Radiculopathy, lumbar region: Secondary | ICD-10-CM

## 2021-07-30 ENCOUNTER — Ambulatory Visit
Admission: RE | Admit: 2021-07-30 | Discharge: 2021-07-30 | Disposition: A | Discharge status: Home / Self Care | Payer: No Typology Code available for payment source | Source: Ambulatory Visit | Attending: Family Medicine | Admitting: Family Medicine

## 2021-07-30 DIAGNOSIS — M5416 Radiculopathy, lumbar region: Secondary | ICD-10-CM

## 2021-07-30 IMAGING — XA Imaging study
2 series · 2 of 2 positions shown · non-contrast
Comparison: none

CLINICAL DATA: Lumbosacral spondylosis without myelopathy. Partial
improvement following multiple prior epidural injections. Persistent
low back pain and intermittent numbness in the legs, left greater
than right.

[Series 1: ortho adipose · 1 of 1 slices shown (1 of 2)]
[im 1/1]
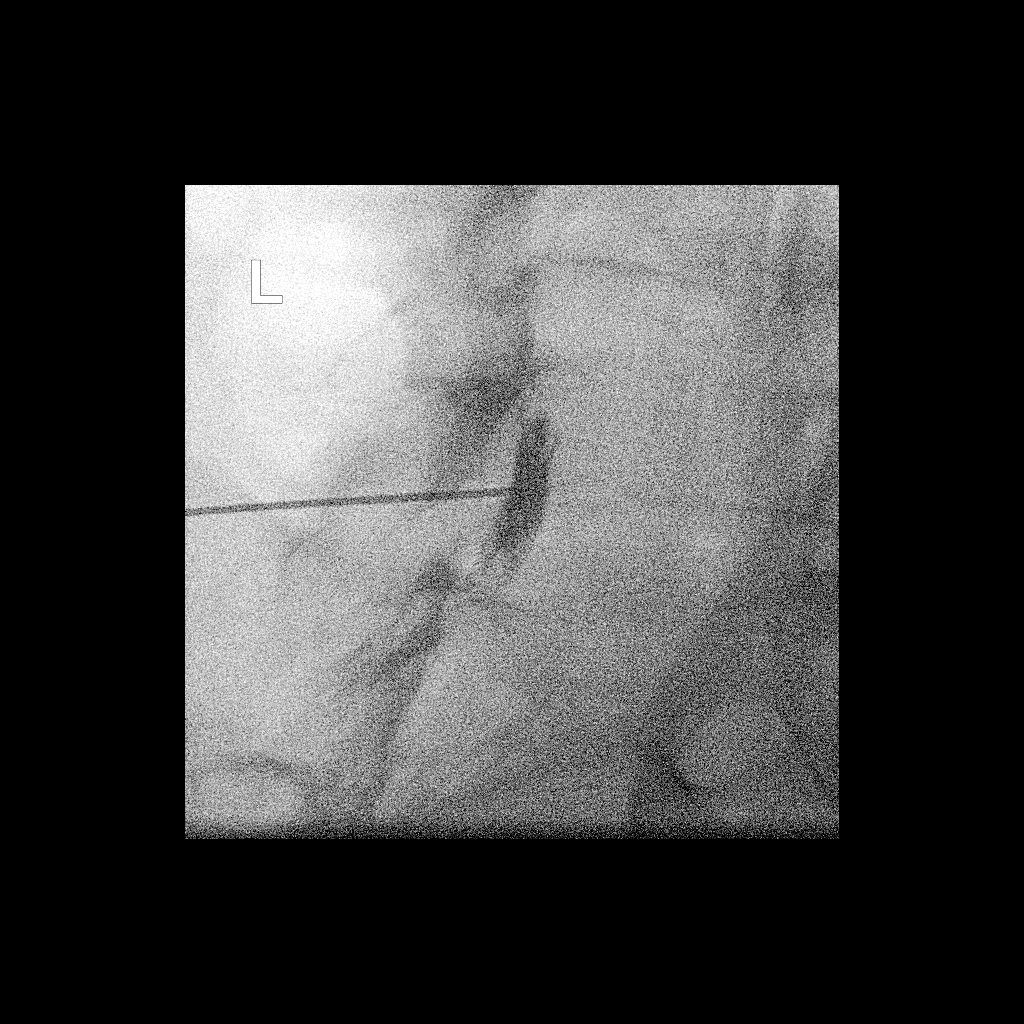

[Series 2: ortho adipose · 1 of 1 slices shown (2 of 2)]
[im 1/1]
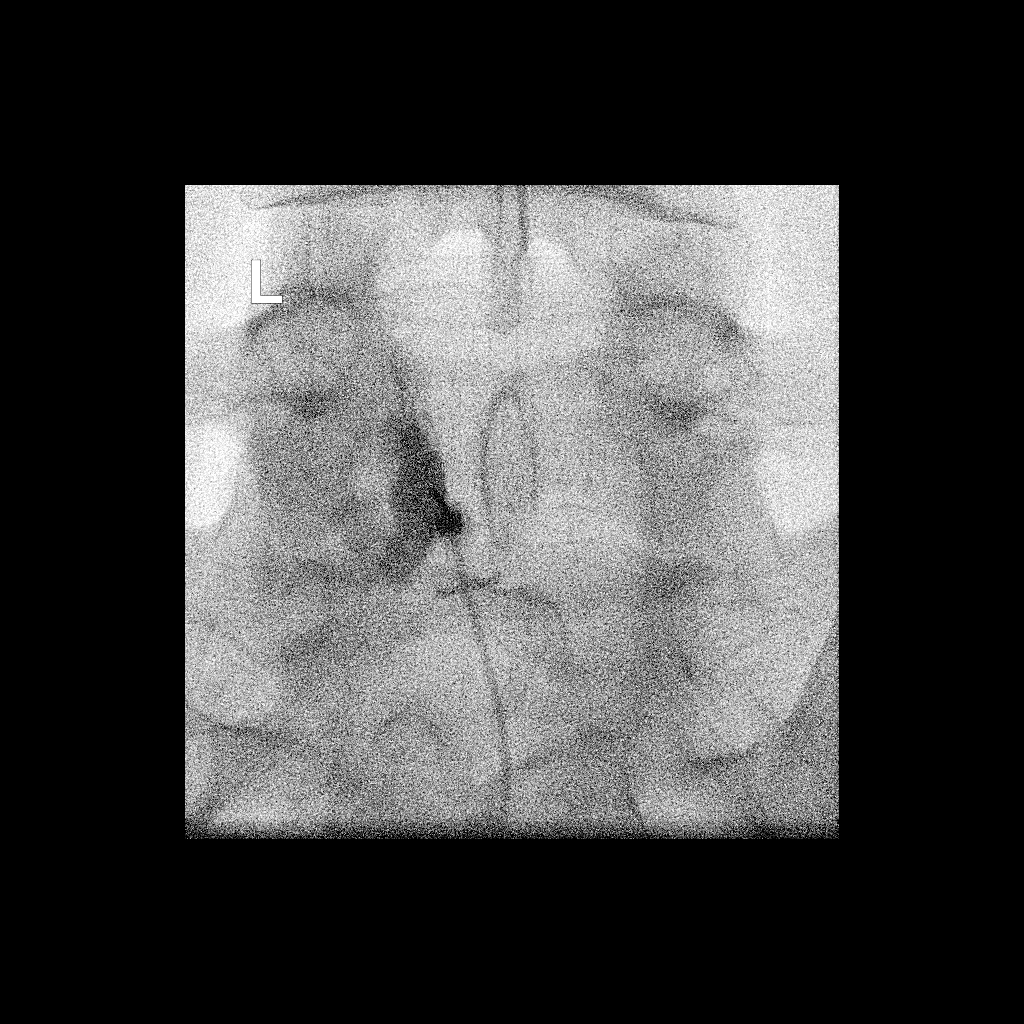

[2 of 2 positions shown; findings below may reference images not displayed]

FLUOROSCOPY:
Radiation Exposure Index (as provided by the fluoroscopic device):
2.0 mGy Kerma

PROCEDURE:
The procedure, risks, benefits, and alternatives were explained to
the patient. Questions regarding the procedure were encouraged and
answered. The patient understands and consents to the procedure.

LUMBAR EPIDURAL INJECTION:

An interlaminar approach was performed on the left at L5-S1. The
overlying skin was cleansed and anesthetized. A 3.5 inch 20 gauge
epidural needle was advanced using loss-of-resistance technique.

DIAGNOSTIC EPIDURAL INJECTION:

Injection of Isovue-M 200 shows a good epidural pattern with spread
above and below the level of needle placement, primarily on the
left. No vascular opacification is seen.

THERAPEUTIC EPIDURAL INJECTION:

80 mg of Depo-Medrol mixed with 3 mL of 1% lidocaine were instilled.
The procedure was well-tolerated, and the patient was discharged
thirty minutes following the injection in good condition.

COMPLICATIONS:
None immediate
IMPRESSION: Technically successful interlaminar epidural injection on the left
at L5-S1.

## 2021-07-30 MED ORDER — METHYLPREDNISOLONE ACETATE 40 MG/ML INJ SUSP (RADIOLOG
80.0000 mg | Freq: Once | INTRAMUSCULAR | Status: AC
  Administered 2021-07-30: 80 mg via EPIDURAL

## 2021-07-30 MED ORDER — IOPAMIDOL (ISOVUE-M 200) INJECTION 41%
1.0000 mL | Freq: Once | INTRAMUSCULAR | Status: AC
  Administered 2021-07-30: 1 mL via EPIDURAL

## 2021-07-30 NOTE — Discharge Instructions (Signed)

## 2021-08-18 ENCOUNTER — Other Ambulatory Visit: Payer: Self-pay | Admitting: Family Medicine

## 2021-08-19 NOTE — Progress Notes (Unsigned)
  [facility] [place] Phone: [phone] Subjective:   Johnny [name], am serving as a scribe for Dr. Hulan Saas.   I'm seeing this patient by the request  of:  Johnny Barrack, MD  CC: back pain   [id number]  Johnny [name] is a 41 y.o. male coming in with complaint of back and neck pain. OMT 07/02/2021. Epidural 07/30/2021. Patient states that epidural helped with sciatic nerve pain. Still feeling stiff and achy daily. Is not where he would like to be.   Medications patient has been prescribed: Gabapentin, Vit D  Taking:         Reviewed prior external information including notes and imaging from previsou exam, outside providers and external EMR if available.   As well as notes that were available from care everywhere and other healthcare systems.  Past medical history, social, surgical and family history all reviewed in electronic medical record.  [name] information unless stated regarding to the chief complaint.   Past Medical History:  Diagnosis Date   Crohn's disease ([place])    GERD (gastroesophageal reflux disease)    Hyperlipidemia    Hypertension     Allergies  Allergen Reactions   Other Diarrhea and Nausea And Vomiting    Champagne    Codeine Nausea Only     Review of Systems:  [name], visual changes, nausea, vomiting, diarrhea, constipation, dizziness, abdominal pain, skin rash, fevers, chills, night sweats, weight loss, swollen lymph nodes, body aches, joint swelling, chest pain, shortness of breath, mood changes. POSITIVE muscle aches  Objective  Blood pressure 118/84, pulse 79, height 6' 4"  (1.93 m), weight 239 lb (108.4 kg), SpO2 98 %.   General: [name] apparent distress alert and oriented x3 mood and affect normal, dressed appropriately.  HEENT: Pupils equal, extraocular movements intact  Respiratory: Patient's speak in full sentences and does not appear short of breath   Cardiovascular: [name] lower extremity edema, non tender, [name] erythema  Gait MSK:  Back does have loss of lordosis. Worsening pain with extension  Tightness with FABER Neg SLT but tight    Osteopathic findings  T9 extended rotated and side bent left L2 flexed rotated and side bent right Sacrum right on right     Assessment and Plan:  Low back pain Continues to have lower back pain.  The patient is not responding as much to the epidurals as well as had previously.  Discussed which activities to do and which ones to avoid.    Nonallopathic problems  Decision today to treat with OMT was based on Physical Exam  After verbal consent patient was treated with HVLA, ME, FPR techniques in  thoracic, lumbar, and sacral  areas  Patient tolerated the procedure well with improvement in symptoms  Patient given exercises, stretches and lifestyle modifications  See medications in patient instructions if given  Patient will follow up in 4-8 weeks    The above documentation has been reviewed and is accurate and complete [name], DO          Note: This dictation was prepared with Dragon dictation along with smaller phrase technology. Any transcriptional errors that result from this process are unintentional.

## 2021-08-20 ENCOUNTER — Encounter: Payer: Self-pay | Admitting: Family Medicine

## 2021-08-20 ENCOUNTER — Ambulatory Visit: Payer: No Typology Code available for payment source | Admitting: Family Medicine

## 2021-08-20 VITALS — BP 118/84 | HR 79 | Ht 76.0 in | Wt 239.0 lb

## 2021-08-20 DIAGNOSIS — M9902 Segmental and somatic dysfunction of thoracic region: Secondary | ICD-10-CM | POA: Diagnosis not present

## 2021-08-20 DIAGNOSIS — M9904 Segmental and somatic dysfunction of sacral region: Secondary | ICD-10-CM | POA: Diagnosis not present

## 2021-08-20 DIAGNOSIS — M545 Low back pain, unspecified: Secondary | ICD-10-CM

## 2021-08-20 DIAGNOSIS — G8929 Other chronic pain: Secondary | ICD-10-CM

## 2021-08-20 DIAGNOSIS — M9903 Segmental and somatic dysfunction of lumbar region: Secondary | ICD-10-CM

## 2021-08-20 NOTE — Assessment & Plan Note (Signed)
Continues to have lower back pain.  The patient is not responding as much to the epidurals as well as had previously.  Discussed which activities to do and which ones to avoid.

## 2021-08-20 NOTE — Patient Instructions (Signed)
Good to see you Got a lot of movement Referral to Dr. Lynann Bologna See me again in 6-8 weeks

## 2021-08-21 ENCOUNTER — Other Ambulatory Visit: Payer: Self-pay | Admitting: Family Medicine

## 2021-09-02 ENCOUNTER — Encounter: Payer: Self-pay | Admitting: Family Medicine

## 2021-09-08 ENCOUNTER — Other Ambulatory Visit: Payer: Self-pay

## 2021-09-08 ENCOUNTER — Encounter: Payer: Self-pay | Admitting: Family Medicine

## 2021-09-08 DIAGNOSIS — M5459 Other low back pain: Secondary | ICD-10-CM

## 2021-09-17 ENCOUNTER — Ambulatory Visit
Admission: RE | Admit: 2021-09-17 | Discharge: 2021-09-17 | Disposition: A | Discharge status: Home / Self Care | Payer: No Typology Code available for payment source | Source: Ambulatory Visit | Attending: Family Medicine | Admitting: Family Medicine

## 2021-09-17 DIAGNOSIS — M5459 Other low back pain: Secondary | ICD-10-CM

## 2021-09-17 MED ORDER — METHYLPREDNISOLONE ACETATE 40 MG/ML INJ SUSP (RADIOLOG
80.0000 mg | Freq: Once | INTRAMUSCULAR | Status: AC
  Administered 2021-09-17: 80 mg via INTRA_ARTICULAR

## 2021-09-17 MED ORDER — IOPAMIDOL (ISOVUE-M 200) INJECTION 41%
1.0000 mL | Freq: Once | INTRAMUSCULAR | Status: AC
  Administered 2021-09-17: 1 mL via INTRA_ARTICULAR

## 2021-09-17 NOTE — Discharge Instructions (Signed)

## 2021-10-07 NOTE — Progress Notes (Unsigned)
Johnny [name] [address] [place] Phone: [phone] Subjective:   Johnny [name], am serving as a scribe for Dr. Hulan Saas.  I'm seeing this patient by the request  of:  Johnny Barrack, MD  CC: Low back pain, bilateral hip pain  [id number]  Johnny [name] is a 41 y.o. male coming in with complaint of back and neck pain. OMT 08/20/2021. Patient states doing okay. Injections only helped a little. No new issues  Medications patient has been prescribed: Gabapentin, Vit D  Taking:         Reviewed prior external information including notes and imaging from previsou exam, outside providers and external EMR if available.   As well as notes that were available from care everywhere and other healthcare systems.  Past medical history, social, surgical and family history all reviewed in electronic medical record.  No pertanent information unless stated regarding to the chief complaint.   Past Medical History:  Diagnosis Date   Crohn's disease ([place])    GERD (gastroesophageal reflux disease)    Hyperlipidemia    Hypertension     Allergies  Allergen Reactions   Other Diarrhea and Nausea And Vomiting    Champagne    Codeine Nausea Only     Review of Systems:  No headache, visual changes, nausea, vomiting, diarrhea, constipation, dizziness, abdominal pain, skin rash, fevers, chills, night sweats, weight loss, swollen lymph nodes, body aches, joint swelling, chest pain, shortness of breath, mood changes. POSITIVE muscle aches  Objective  Blood pressure (!) 130/90, pulse 86, height 6' 4"  (1.93 m), weight 236 lb (107 kg), SpO2 98 %.   General: No apparent distress alert and oriented x3 mood and affect normal, dressed appropriately.  HEENT: Pupils equal, extraocular movements intact  Respiratory: Patient's speak in full sentences and does not appear short of breath  Cardiovascular: No lower extremity edema, non tender, no  erythema  Gait MSK:  Back low back continues to have loss of lordosis.  Severe tenderness to palpation over the sacroiliac joints and worsening pain actually over the greater trochanteric area bilaterally.  Positive FABER test.  Negative straight leg test.  Osteopathic findings   T5 extended rotated and side bent left L3 flexed rotated and side bent right L5 flexed rotated and side bent left Sacrum right on right    After verbal consent patient was prepped with alcohol swab and with a 21-gauge 2 inch needle injected into the right greater trochanteric area with 2 cc of 0.5% Marcaine and 1 cc of Kenalog 40 mg/mL.  No blood loss.  Band-Aid placed.  Postinjection instructions given  After verbal consent patient was prepped with alcohol swab and with a 21-gauge 2 inch needle injected into the left greater trochanteric area with a total of 2 cc of 0.5% Marcaine and 1 cc of Kenalog 40 mg/mL.  No blood loss.  Band-Aid placed.  Postinjection instructions given    Assessment and Plan:  Greater trochanteric bursitis of both hips Bilateral injections given.  Tolerated the procedure well, discussed icing regimen and home exercises.  Does have the underlying arthritic changes noted as well.  Also has the chronic disease that makes it concerning.  Could be more lumbar radiculopathy and we did discuss if this does not make significant improvement we need to consider medial branch blocks and possible radiofrequency ablation but may be difficult in this individual.  Follow-up again in 6 to 8 weeks continue medications including gabapentin  and Zanaflex  Sacroiliac pain If worsening pain will consider the possibility of repeating injections   Low back pain Continue to have difficulty.  Have tried multiple different medications for his worsening symptoms.  Discussed the possibility of medial branch block to see if this would be another possibility.  Patient wants to consider the sacroiliac injections first.   Attempted osteopathic manipulation with some resolution of pain but is always seems to be short-lived.  Follow-up with me again in 6 to 8 weeks    Nonallopathic problems  Decision today to treat with OMT was based on Physical Exam  After verbal consent patient was treated with HVLA, ME, FPR techniques in  thoracic, lumbar, and sacral  areas  Patient tolerated the procedure well with improvement in symptoms  Patient given exercises, stretches and lifestyle modifications  See medications in patient instructions if given  Patient will follow up in 4-8 weeks      The above documentation has been reviewed and is accurate and complete Johnny Pulley, DO        Note: This dictation was prepared with Dragon dictation along with smaller phrase technology. Any transcriptional errors that result from this process are unintentional.

## 2021-10-08 ENCOUNTER — Ambulatory Visit (INDEPENDENT_AMBULATORY_CARE_PROVIDER_SITE_OTHER): Payer: BC Managed Care – PPO | Admitting: Family Medicine

## 2021-10-08 VITALS — BP 130/90 | HR 86 | Ht 76.0 in | Wt 236.0 lb

## 2021-10-08 DIAGNOSIS — M533 Sacrococcygeal disorders, not elsewhere classified: Secondary | ICD-10-CM

## 2021-10-08 DIAGNOSIS — M9902 Segmental and somatic dysfunction of thoracic region: Secondary | ICD-10-CM

## 2021-10-08 DIAGNOSIS — M7061 Trochanteric bursitis, right hip: Secondary | ICD-10-CM

## 2021-10-08 DIAGNOSIS — M545 Low back pain, unspecified: Secondary | ICD-10-CM | POA: Diagnosis not present

## 2021-10-08 DIAGNOSIS — G8929 Other chronic pain: Secondary | ICD-10-CM

## 2021-10-08 DIAGNOSIS — M9904 Segmental and somatic dysfunction of sacral region: Secondary | ICD-10-CM | POA: Diagnosis not present

## 2021-10-08 DIAGNOSIS — M9903 Segmental and somatic dysfunction of lumbar region: Secondary | ICD-10-CM

## 2021-10-08 DIAGNOSIS — M7062 Trochanteric bursitis, left hip: Secondary | ICD-10-CM

## 2021-10-08 NOTE — Assessment & Plan Note (Signed)
Bilateral injections given.  Tolerated the procedure well, discussed icing regimen and home exercises.  Does have the underlying arthritic changes noted as well.  Also has the chronic disease that makes it concerning.  Could be more lumbar radiculopathy and we did discuss if this does not make significant improvement we need to consider medial branch blocks and possible radiofrequency ablation but may be difficult in this individual.  Follow-up again in 6 to 8 weeks continue medications including gabapentin and Zanaflex

## 2021-10-08 NOTE — Assessment & Plan Note (Signed)
Continue to have difficulty.  Have tried multiple different medications for his worsening symptoms.  Discussed the possibility of medial branch block to see if this would be another possibility.  Patient wants to consider the sacroiliac injections first.  Attempted osteopathic manipulation with some resolution of pain but is always seems to be short-lived.  Follow-up with me again in 6 to 8 weeks

## 2021-10-08 NOTE — Assessment & Plan Note (Addendum)
If worsening pain will consider the possibility of repeating injections

## 2021-10-08 NOTE — Patient Instructions (Addendum)
Good to see you! Bilateral GT injections today You can read about RFA Can consider SI joint injections

## 2021-10-20 DIAGNOSIS — H9209 Otalgia, unspecified ear: Secondary | ICD-10-CM | POA: Diagnosis not present

## 2021-10-20 DIAGNOSIS — G4489 Other headache syndrome: Secondary | ICD-10-CM | POA: Diagnosis not present

## 2021-10-20 DIAGNOSIS — Z20822 Contact with and (suspected) exposure to covid-19: Secondary | ICD-10-CM | POA: Diagnosis not present

## 2021-10-20 DIAGNOSIS — H6122 Impacted cerumen, left ear: Secondary | ICD-10-CM | POA: Diagnosis not present

## 2021-11-05 ENCOUNTER — Ambulatory Visit: Payer: BC Managed Care – PPO | Admitting: Family Medicine

## 2021-11-05 ENCOUNTER — Encounter: Payer: Self-pay | Admitting: Family Medicine

## 2021-11-05 VITALS — BP 144/84 | HR 78 | Temp 98.5°F | Ht 76.0 in | Wt 229.8 lb

## 2021-11-05 DIAGNOSIS — Z23 Encounter for immunization: Secondary | ICD-10-CM | POA: Diagnosis not present

## 2021-11-05 DIAGNOSIS — I10 Essential (primary) hypertension: Secondary | ICD-10-CM

## 2021-11-05 DIAGNOSIS — F419 Anxiety disorder, unspecified: Secondary | ICD-10-CM | POA: Diagnosis not present

## 2021-11-05 MED ORDER — CITALOPRAM HYDROBROMIDE 20 MG PO TABS: 20.0000 mg | ORAL_TABLET | Freq: Every day | ORAL | 3 refills | Status: DC

## 2021-11-05 NOTE — Patient Instructions (Signed)
It was very nice to see you today!  Please start the Celexa.  We will give your flu shot today.  Send a message in a few weeks to let me know how you are doing.  Take care, Dr Jerline Pain  PLEASE NOTE:  If you had any lab tests please let us know if you have not heard back within a few days. You may see your results on mychart before we have a chance to review them but we will give you a call once they are reviewed by [place]. If we ordered any referrals today, please let us know if you have not heard from their office within the next week.   Please try these tips to maintain a healthy lifestyle:  Eat at least 3 REAL meals and 1-2 snacks per day.  Aim for no more than 5 hours between eating.  If you eat breakfast, please do so within one hour of getting up.   Each meal should contain half fruits/vegetables, one quarter protein, and one quarter carbs (no bigger than a computer mouse)  Cut down on sweet beverages. This includes juice, soda, and sweet tea.   Drink at least 1 glass of water with each meal and aim for at least 8 glasses per day  Exercise at least 150 minutes every week.

## 2021-11-05 NOTE — Assessment & Plan Note (Signed)
Symptoms are not controlled.  No SI or HI.  Had lengthy discussion with patient regarding his treatment options.  He is currently looking for a new job which should help.  We discussed referral to therapy however he declined.  We will start Celexa 20 mg daily.  He will follow-up me in a few weeks via MyChart and we can titrate the dose as needed.  We discussed potential side effects.

## 2021-11-05 NOTE — Assessment & Plan Note (Signed)
Slightly elevated though that typically is well controlled.  May be elevated today due to his increased anxiety.  We will continue to monitor at home with goal 140/90 or lower.  Continue amlodipine 5 mg daily.

## 2021-11-05 NOTE — Progress Notes (Signed)
   Johnny [name] is a 41 y.o. male who presents today for an office visit.  Assessment/Plan:  Chronic Problems Addressed Today: Anxiety Symptoms are not controlled.  No SI or HI.  Had lengthy discussion with patient regarding his treatment options.  He is currently looking for a new job which should help.  We discussed referral to therapy however he declined.  We will start Celexa 20 mg daily.  He will follow-up me in a few weeks via MyChart and we can titrate the dose as needed.  We discussed potential side effects.  Benign essential hypertension Slightly elevated though that typically is well controlled.  May be elevated today due to his increased anxiety.  We will continue to monitor at home with goal 140/90 or lower.  Continue amlodipine 5 mg daily.    Subjective:  HPI:  See A/p for status of chronic conditions.  He has been under a lot of stress recently, especially at work. He has had issue with his partners at work. 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 This has been going on for over a month.  Feels stressed throughout the day now.  He is worrying excessively about work Paramedic.  He had a panic attack a few weeks ago after coming back from vacation in which his partner did not complete any of the responsibilities they were supposed to.  His partner was fired and he was written for this.  He is currently looking for a new job.       Objective:  Physical Exam: BP (!) 144/84   Pulse 78   Temp 98.5 F (36.9 C) (Temporal)   Ht 6' 4"  (1.93 m)   Wt 229 lb 12.8 oz (104.2 kg)   SpO2 99%   BMI 27.97 kg/m   Gen: No acute distress, resting comfortably Neuro: Grossly normal, moves all extremities Psych: Normal affect and thought content      Johnny Chui M. Jerline Pain, MD 11/05/2021 12:10 PM

## 2021-11-10 DIAGNOSIS — R112 Nausea with vomiting, unspecified: Secondary | ICD-10-CM | POA: Diagnosis not present

## 2021-11-10 DIAGNOSIS — F419 Anxiety disorder, unspecified: Secondary | ICD-10-CM | POA: Diagnosis not present

## 2021-11-12 DIAGNOSIS — K50919 Crohn's disease, unspecified, with unspecified complications: Secondary | ICD-10-CM | POA: Diagnosis not present

## 2021-11-12 DIAGNOSIS — R5383 Other fatigue: Secondary | ICD-10-CM | POA: Diagnosis not present

## 2021-11-12 DIAGNOSIS — R110 Nausea: Secondary | ICD-10-CM | POA: Diagnosis not present

## 2021-11-12 DIAGNOSIS — R634 Abnormal weight loss: Secondary | ICD-10-CM | POA: Diagnosis not present

## 2021-11-13 DIAGNOSIS — K50919 Crohn's disease, unspecified, with unspecified complications: Secondary | ICD-10-CM | POA: Diagnosis not present

## 2021-11-13 DIAGNOSIS — R5383 Other fatigue: Secondary | ICD-10-CM | POA: Diagnosis not present

## 2021-11-13 DIAGNOSIS — R634 Abnormal weight loss: Secondary | ICD-10-CM | POA: Diagnosis not present

## 2021-11-18 NOTE — Progress Notes (Unsigned)
[place] Phone: [phone] Subjective:   [name], am serving as a scribe for Dr. Hulan Saas.  I'm seeing this patient by the request  of:  Vivi Barrack, MD  CC: back and neck pain follow up   [id number]  Johnny [name] is a 41 y.o. male coming in with complaint of back and neck pain. OMT on 10/08/2021. Patient states that injections in hips did not help. Felt like pain got worse.   Feeling dizzy and nauseous daily for approximately 3 weeks. Unable to eat. Has seen gastro, PCP, and seen in ED. Was told he has anxiety but he feels that something else is not right within his body. Put in Celexa by his PCP.   Medications patient has been prescribed:   Taking:  Reviewing patient's chart patient was seen in the emergency department recently as well       Reviewed prior external information including notes and imaging from previsou exam, outside providers and external EMR if available.   As well as notes that were available from care everywhere and other healthcare systems.  Past medical history, social, surgical and family history all reviewed in electronic medical record.  No pertanent information unless stated regarding to the chief complaint.   Past Medical History:  Diagnosis Date   Crohn's disease ([place])    GERD (gastroesophageal reflux disease)    Hyperlipidemia    Hypertension     Allergies  Allergen Reactions   Other Diarrhea and Nausea And Vomiting    Champagne    Codeine Nausea Only     Review of Systems:  No , visual changes, nausea, vomiting,  constipation, dizziness, abdominal pain, skin rash, fevers, chills, night sweats, weight loss, swollen lymph nodes, body aches, joint swelling, chest pain, shortness of breath, mood changes. POSITIVE muscle aches mild diarrhea, body aches, questionable headaches, loss of appetite  Objective  Blood pressure (!) 122/92, pulse 74,  height 6' 4"  (1.93 m), weight 220 lb (99.8 kg), SpO2 98 %.   General: Alert and oriented x3, mildly anxious but does appear to be somewhat fatigued.  Have known patient for a long time and does not appear to be himself. HEENT: Pupils equal, extraocular movements intact  Respiratory: Patient's speak in full sentences and does not appear short of breath  Cardiovascular: No lower extremity edema, non tender, no erythema  MSK:  Back pain does have some mild loss of lordosis.        Assessment and Plan:  Fatigue Patient is having fatigue out of the ordinary.  Has been seen in the emergency room and had some mild work-up previously.  Does have a history of Crohn's disease and rule out any other type of vitamin deficiencies that could be potentially contributing.  Underlying anxiety could be a potential cause and patient is on Celexa.  Discussed potentially following up with primary care if everything is normal.  I also discussed that there is a chance with patient having some GI complaints that he could be having a smoldering Crohn's flare and patient did have a sedimentation rate that was elevated.  Could consider the possibility of a short course of prednisone which patient was given a 10-day titration if necessary.  Depending on findings of the labs this will change medical management.  Follow-up with me to restart hopefully osteopathic manipulation in 4 to 6 weeks.  Total time reviewing patient's chart and discussing with patient  41 minutes        The above documentation has been reviewed and is accurate and complete [name], DO         Note: This dictation was prepared with Dragon dictation along with smaller phrase technology. Any transcriptional errors that result from this process are unintentional.

## 2021-11-19 ENCOUNTER — Ambulatory Visit: Payer: BC Managed Care – PPO | Admitting: Family Medicine

## 2021-11-19 ENCOUNTER — Other Ambulatory Visit: Payer: Self-pay | Admitting: Family Medicine

## 2021-11-19 VITALS — BP 122/92 | HR 74 | Ht 76.0 in | Wt 220.0 lb

## 2021-11-19 DIAGNOSIS — R5383 Other fatigue: Secondary | ICD-10-CM

## 2021-11-19 DIAGNOSIS — M2550 Pain in unspecified joint: Secondary | ICD-10-CM | POA: Diagnosis not present

## 2021-11-19 LAB — B12 AND FOLATE PANEL
Folate: 18.5 ng/mL
Vitamin B-12: 1250 pg/mL — ABNORMAL HIGH (ref 211–911)

## 2021-11-19 LAB — IBC PANEL
Iron: 102 ug/dL (ref 42–165)
Saturation Ratios: 27.4 % (ref 20.0–50.0)
TIBC: 372.4 ug/dL (ref 250.0–450.0)
Transferrin: 266 mg/dL (ref 212.0–360.0)

## 2021-11-19 LAB — T4, FREE: Free T4: 0.82 ng/dL (ref 0.60–1.60)

## 2021-11-19 LAB — SEDIMENTATION RATE: Sed Rate: 22 mm/h — ABNORMAL HIGH (ref 0–15)

## 2021-11-19 LAB — TESTOSTERONE: Testosterone: 271.48 ng/dL — ABNORMAL LOW (ref 300.00–890.00)

## 2021-11-19 LAB — T3, FREE: T3, Free: 3.7 pg/mL (ref 2.3–4.2)

## 2021-11-19 LAB — FERRITIN: Ferritin: 176.6 ng/mL (ref 22.0–322.0)

## 2021-11-19 LAB — VITAMIN D 25 HYDROXY (VIT D DEFICIENCY, FRACTURES): VITD: 46.99 ng/mL (ref 30.00–100.00)

## 2021-11-19 MED ORDER — PREDNISONE 20 MG PO TABS: 40.0000 mg | ORAL_TABLET | Freq: Every day | ORAL | 0 refills | Status: DC

## 2021-11-19 NOTE — Assessment & Plan Note (Signed)
Patient is having fatigue out of the ordinary.  Has been seen in the emergency room and had some mild work-up previously.  Does have a history of Crohn's disease and rule out any other type of vitamin deficiencies that could be potentially contributing.  Underlying anxiety could be a potential cause and patient is on Celexa.  Discussed potentially following up with primary care if everything is normal.  I also discussed that there is a chance with patient having some GI complaints that he could be having a smoldering Crohn's flare and patient did have a sedimentation rate that was elevated.  Could consider the possibility of a short course of prednisone which patient was given a 10-day titration if necessary.  Depending on findings of the labs this will change medical management.  Follow-up with me to restart hopefully osteopathic manipulation in 4 to 6 weeks.  Total time reviewing patient's chart and discussing with patient 41 minutes

## 2021-11-19 NOTE — Patient Instructions (Addendum)
Labs today Prednisone 41m 5 days, then 29m5 days after

## 2021-11-20 ENCOUNTER — Encounter: Payer: Self-pay | Admitting: Family Medicine

## 2021-11-20 LAB — PTH, INTACT AND CALCIUM
Calcium: 10.2 mg/dL (ref 8.6–10.3)
PTH: 45 pg/mL (ref 16–77)

## 2021-11-20 LAB — D-DIMER, QUANTITATIVE: D-Dimer, Quant: 0.23 mcg/mL FEU (ref <0.50)

## 2021-11-20 LAB — THYROID PEROXIDASE ANTIBODIES (TPO) (REFL): Thyroperoxidase Ab SerPl-aCnc: 196 IU/mL — ABNORMAL HIGH (ref <9)

## 2021-11-20 NOTE — Telephone Encounter (Signed)
Patient called following up on the B12 level. Please advise (or respond to patient in MyChart).

## 2021-11-23 ENCOUNTER — Encounter: Payer: Self-pay | Admitting: Family Medicine

## 2021-11-24 NOTE — Telephone Encounter (Signed)
See note

## 2021-11-27 ENCOUNTER — Other Ambulatory Visit: Payer: Self-pay | Admitting: Family Medicine

## 2021-11-27 NOTE — Telephone Encounter (Signed)
All of his labs look relatively normal.  Would not expect Celexa to be causing his symptoms however it is possible.  If he has not noticed any improvement in his anxiety symptoms with Celexa it is okay for him to stop and monitor his symptoms further.  He should follow-up with me in a few weeks.

## 2021-11-30 ENCOUNTER — Ambulatory Visit: Payer: BC Managed Care – PPO | Admitting: Family Medicine

## 2021-11-30 ENCOUNTER — Encounter: Payer: Self-pay | Admitting: Family Medicine

## 2021-11-30 VITALS — BP 127/85 | HR 84 | Temp 98.3°F | Ht 76.0 in | Wt 214.8 lb

## 2021-11-30 DIAGNOSIS — E538 Deficiency of other specified B group vitamins: Secondary | ICD-10-CM

## 2021-11-30 DIAGNOSIS — F419 Anxiety disorder, unspecified: Secondary | ICD-10-CM

## 2021-11-30 DIAGNOSIS — E559 Vitamin D deficiency, unspecified: Secondary | ICD-10-CM | POA: Diagnosis not present

## 2021-11-30 DIAGNOSIS — R5383 Other fatigue: Secondary | ICD-10-CM

## 2021-11-30 LAB — CBC
HCT: 44.2 % (ref 39.0–52.0)
Hemoglobin: 14.4 g/dL (ref 13.0–17.0)
MCHC: 32.6 g/dL (ref 30.0–36.0)
MCV: 74.4 fl — ABNORMAL LOW (ref 78.0–100.0)
Platelets: 249 10*3/uL (ref 150.0–400.0)
RBC: 5.94 Mil/uL — ABNORMAL HIGH (ref 4.22–5.81)
RDW: 14 % (ref 11.5–15.5)
WBC: 10.3 10*3/uL (ref 4.0–10.5)

## 2021-11-30 LAB — T3, FREE: T3, Free: 3.9 pg/mL (ref 2.3–4.2)

## 2021-11-30 LAB — SEDIMENTATION RATE: Sed Rate: 28 mm/hr — ABNORMAL HIGH (ref 0–15)

## 2021-11-30 LAB — VITAMIN D 25 HYDROXY (VIT D DEFICIENCY, FRACTURES): VITD: 37.27 ng/mL (ref 30.00–100.00)

## 2021-11-30 LAB — CORTISOL: Cortisol, Plasma: 14.1 ug/dL

## 2021-11-30 LAB — VITAMIN B12: Vitamin B-12: 798 pg/mL (ref 211–911)

## 2021-11-30 LAB — T4, FREE: Free T4: 0.77 ng/dL (ref 0.60–1.60)

## 2021-11-30 LAB — TSH: TSH: 1.59 u[IU]/mL (ref 0.35–5.50)

## 2021-11-30 MED ORDER — CITALOPRAM HYDROBROMIDE 10 MG PO TABS: 10.0000 mg | ORAL_TABLET | Freq: Every day | ORAL | 3 refills | Status: DC

## 2021-11-30 NOTE — Patient Instructions (Signed)
It was very nice to see you today!  We will check blood work today.  Please decrease your Celexa to 10 mg daily.  Please send me a message in a few days limit how this is working and we can decrease the dose further if needed.  We may need to get a CT scan depending on the results of your blood work.  Take care, Dr Jerline Pain  PLEASE NOTE:  If you had any lab tests please let us know if you have not heard back within a few days. You may see your results on mychart before we have a chance to review them but we will give you a call once they are reviewed by [place]. If we ordered any referrals today, please let us know if you have not heard from their office within the next week.   Please try these tips to maintain a healthy lifestyle:  Eat at least 3 REAL meals and 1-2 snacks per day.  Aim for no more than 5 hours between eating.  If you eat breakfast, please do so within one hour of getting up.   Each meal should contain half fruits/vegetables, one quarter protein, and one quarter carbs (no bigger than a computer mouse)  Cut down on sweet beverages. This includes juice, soda, and sweet tea.   Drink at least 1 glass of water with each meal and aim for at least 8 glasses per day  Exercise at least 150 minutes every week.

## 2021-11-30 NOTE — Assessment & Plan Note (Signed)
We will recheck vitamin D today.

## 2021-11-30 NOTE — Assessment & Plan Note (Signed)
Symptoms symptoms anxiety are little bit better controlled though he is concerned about tensional worsening side effect of the Celexa.  We will decrease to 10 mg daily to see how he does with this.  He can follow-up with me in a few days and we can titrate the dose as needed.

## 2021-11-30 NOTE — Progress Notes (Addendum)
   Johnny [name] is a 41 y.o. male who presents today for an office visit.  Assessment/Plan:  New/Acute Problems: Fatigue Unclear etiology.  Has significant amount of fatigue today.  We will recheck labs today including CBC, c-Met, TSH, T4, T3, B12, vitamin D, and cortisol levels.  He is having some occasional headache as well.  Depending on results may need to get a CT maxillofacial to rule out sinus infection.  It is possible the Celexa could be contributing to this.  We will be weaning down as below.  May consider referral for sleep study to rule out OSA if above is negative.  Chronic Problems Addressed Today: Anxiety Symptoms symptoms anxiety are little bit better controlled though he is concerned about tensional worsening side effect of the Celexa.  We will decrease to 10 mg daily to see how he does with this.  He can follow-up with me in a few days and we can titrate the dose as needed.  Vitamin D deficiency We will recheck vitamin D today.  Vitamin B12 deficiency We will recheck B12.     Subjective:  HPI:  Patient here for follow-up.  We saw him about 3 weeks ago for anxiety.  We started Celexa 20 mg daily.  He ended up going to the Ed 5 days later with vomiting, chills, and generalized weakness.  Labs there were negative.  He was discharged home with a prescription for Xanax.  He is still having issues with fatigue, no appetite, poor sleep. A lot shakiness and feeling jittery. No fevers or chills. Occasional headache. HE saw sports medicine afterwards. He was started on prednisone without myuch improvement in symptoms.  Main concern today is debilitating fatigue. This started to get really bad about 4-6 weeks ago but seems to be worsening. No shortness of breath.  He will become extremely tired after minimal exertion.  He is not getting much sleep.  Wakes up early morning and has difficulty falling back asleep.       Objective:  Physical Exam: BP 127/85   Pulse 84   Temp  98.3 F (36.8 C) (Temporal)   Ht 6' 4"  (1.93 m)   Wt 214 lb 12.8 oz (97.4 kg)   SpO2 96%   BMI 26.15 kg/m   Gen: No acute distress, resting comfortably CV: Regular rate and rhythm with no murmurs appreciated Pulm: Normal work of breathing, clear to auscultation bilaterally with no crackles, wheezes, or rhonchi Neuro: Grossly normal, moves all extremities Psych: Normal affect and thought content  Time Spent: 45 minutes of total time was spent on the date of the encounter performing the following actions: chart review prior to seeing the patient including recent ED visit, obtaining history, performing a medically necessary exam, counseling on the treatment plan, placing orders, and documenting in our EHR.        Algis Greenhouse. Jerline Pain, MD 11/30/2021 12:17 PM

## 2021-11-30 NOTE — Assessment & Plan Note (Signed)
We will recheck B12.

## 2021-12-01 ENCOUNTER — Telehealth: Payer: Self-pay | Admitting: Family Medicine

## 2021-12-01 DIAGNOSIS — K3189 Other diseases of stomach and duodenum: Secondary | ICD-10-CM | POA: Diagnosis not present

## 2021-12-01 LAB — COMPREHENSIVE METABOLIC PANEL
ALT: 41 U/L (ref 0–53)
AST: 14 U/L (ref 0–37)
Albumin: 4.3 g/dL (ref 3.5–5.2)
Alkaline Phosphatase: 56 U/L (ref 39–117)
BUN: 15 mg/dL (ref 6–23)
CO2: 27 mEq/L (ref 19–32)
Calcium: 9.7 mg/dL (ref 8.4–10.5)
Chloride: 103 mEq/L (ref 96–112)
Creatinine, Ser: 0.92 mg/dL (ref 0.40–1.50)
GFR: 102.7 mL/min (ref >60.00)
Glucose, Bld: 110 mg/dL — ABNORMAL HIGH (ref 70–99)
Potassium: 3.9 mEq/L (ref 3.5–5.1)
Sodium: 140 mEq/L (ref 135–145)
Total Bilirubin: 0.4 mg/dL (ref 0.2–1.2)
Total Protein: 7.2 g/dL (ref 6.0–8.3)

## 2021-12-01 LAB — HIGH SENSITIVITY CRP: CRP, High Sensitivity: 1.28 mg/L (ref 0.000–5.000)

## 2021-12-01 NOTE — Telephone Encounter (Signed)
Pt States: -Seen on 11/30/21 at [facility] with PCP -States the following symptoms were discussed in OV: Fatigue, brain fog, loss of appetite, etc. -Family/Friend recommended being tested for diabetes.    Pt Requests: -Call back with advice about how to address diabetes theory  Pt declined OV

## 2021-12-02 NOTE — Telephone Encounter (Signed)
Patient scheduled 10/13 with [facility] to discuss possible diabetes

## 2021-12-03 DIAGNOSIS — R634 Abnormal weight loss: Secondary | ICD-10-CM | POA: Diagnosis not present

## 2021-12-03 DIAGNOSIS — R1013 Epigastric pain: Secondary | ICD-10-CM | POA: Diagnosis not present

## 2021-12-03 DIAGNOSIS — K50919 Crohn's disease, unspecified, with unspecified complications: Secondary | ICD-10-CM | POA: Diagnosis not present

## 2021-12-03 DIAGNOSIS — R5383 Other fatigue: Secondary | ICD-10-CM | POA: Diagnosis not present

## 2021-12-03 NOTE — Progress Notes (Signed)
Please inform patient of the following:  Summation levels are very mildly elevated but everything else is stable.  No obvious explanation for his fatigue.  Would like for him to let us know if his symptoms or not improving.  If still having ongoing issues with fatigue, recommend getting a sleep study.  Please place order if he is agreeable.

## 2021-12-04 ENCOUNTER — Ambulatory Visit: Payer: BC Managed Care – PPO | Admitting: Family Medicine

## 2021-12-04 ENCOUNTER — Encounter: Payer: Self-pay | Admitting: Family Medicine

## 2021-12-04 VITALS — BP 137/71 | HR 70 | Temp 97.8°F | Ht 76.0 in | Wt 217.0 lb

## 2021-12-04 DIAGNOSIS — F419 Anxiety disorder, unspecified: Secondary | ICD-10-CM | POA: Diagnosis not present

## 2021-12-04 DIAGNOSIS — R5383 Other fatigue: Secondary | ICD-10-CM | POA: Diagnosis not present

## 2021-12-04 DIAGNOSIS — R7303 Prediabetes: Secondary | ICD-10-CM

## 2021-12-04 LAB — POCT GLYCOSYLATED HEMOGLOBIN (HGB A1C): Hemoglobin A1C: 5.8 % — AB (ref 4.0–5.6)

## 2021-12-04 NOTE — Progress Notes (Signed)
   Johnny [name] is a 41 y.o. male who presents today for an office visit.  Assessment/Plan:  New/Acute Problems: Gastritis  Could be the source of some of his fatigue and malaise but is likely the source of his poor appetite.  He is on omeprazole 40 mg twice daily per GI and has upcoming EGD.  Defer further management to them.  Chronic Problems Addressed Today: Fatigue No significant change since her last visit.  Based on recent CT scan may be having flare of her Crohn's disease.  Will for further management for this to GI.  At some point in the future may consider sleep study if he continues to have fatigue issues and GI work-up is negative.  Anxiety Doing well on Celexa 10 mg daily.  Feels like this is helping with his anxiety symptoms.  We will continue current dose for now.  He will follow-up in a couple weeks via [place].  We can titrate the dose as needed.     Subjective:  HPI:  Patient here for follow up.  We saw him 4 days ago for excessive fatigue. He went to GI 3 days ago and had a CT scan which showed possible acute or chronic gastritis. They are planning on doing an EGD in a few weeks. They started him on prilosec 79m twice daily. He has not had any nausea or vomiting but does not have any appetite.   See A/P for status of chronic conditions.       Objective:  Physical Exam: BP 137/71 (BP Location: Left Arm, Patient Position: Sitting)   Pulse 70   Temp 97.8 F (36.6 C) (Temporal)   Ht 6' 4"  (1.93 m)   Wt 217 lb (98.4 kg)   SpO2 98%   BMI 26.41 kg/m   Gen: No acute distress, resting comfortably CV: Regular rate and rhythm with no murmurs appreciated Pulm: Normal work of breathing, clear to auscultation bilaterally with no crackles, wheezes, or rhonchi Neuro: Grossly normal, moves all extremities Psych: Normal affect and thought content      Zenith Kercheval M. PJerline Pain MD 12/04/2021 9:44 AM

## 2021-12-04 NOTE — Assessment & Plan Note (Signed)
Doing well on Celexa 10 mg daily.  Feels like this is helping with his anxiety symptoms.  We will continue current dose for now.  He will follow-up in a couple weeks via [place].  We can titrate the dose as needed.

## 2021-12-04 NOTE — Assessment & Plan Note (Signed)
No significant change since her last visit.  Based on recent CT scan may be having flare of her Crohn's disease.  Will for further management for this to GI.  At some point in the future may consider sleep study if he continues to have fatigue issues and GI work-up is negative.

## 2021-12-04 NOTE — Patient Instructions (Signed)
It was very nice to see you today!  Your A1c today is 5.8.  You do not have diabetes.  We will continue current medications for now.  Send me a message in a few weeks to let me know how you are doing.  Take care, Dr Jerline Pain  PLEASE NOTE:  If you had any lab tests please let us know if you have not heard back within a few days. You may see your results on mychart before we have a chance to review them but we will give you a call once they are reviewed by [place]. If we ordered any referrals today, please let us know if you have not heard from their office within the next week.   Please try these tips to maintain a healthy lifestyle:  Eat at least 3 REAL meals and 1-2 snacks per day.  Aim for no more than 5 hours between eating.  If you eat breakfast, please do so within one hour of getting up.   Each meal should contain half fruits/vegetables, one quarter protein, and one quarter carbs (no bigger than a computer mouse)  Cut down on sweet beverages. This includes juice, soda, and sweet tea.   Drink at least 1 glass of water with each meal and aim for at least 8 glasses per day  Exercise at least 150 minutes every week.

## 2021-12-16 NOTE — Progress Notes (Unsigned)
[place] [address] [place] Phone: [phone] Subjective:   [name], am serving as a scribe for Dr. Hulan Saas.  I'm seeing this patient by the request  of:  Vivi Barrack, MD  CC: Neck and back pain follow-up  [id number]  USHER HEDBERG is a 41 y.o. male coming in with complaint of back and neck pain. OMT 10/08/2021. Patient states doing about the same. Mildly better since last visit. Having to make himself eat. NO new issues.  Medications patient has been prescribed: Gabapentin, Vit D, Zanaflex  Taking:         Reviewed prior external information including notes and imaging from previsou exam, outside providers and external EMR if available.   As well as notes that were available from care everywhere and other healthcare systems.  Past medical history, social, surgical and family history all reviewed in electronic medical record.  No pertanent information unless stated regarding to the chief complaint.   Past Medical History:  Diagnosis Date   Crohn's disease ([place])    GERD (gastroesophageal reflux disease)    Hyperlipidemia    Hypertension     Allergies  Allergen Reactions   Other Diarrhea and Nausea And Vomiting    Champagne    Codeine Nausea Only     Review of Systems:  No headache, visual changes, nausea, vomiting, diarrhea, constipation, dizziness,l pain, skin rash, fevers, chills, night sweats, weight loss, swollen lymph nodes,  joint swelling, chest pain, shortness of breath, mood changes. POSITIVE muscle aches, abdominal pain, body aches  Objective  Blood pressure 118/86, pulse 80, height 6' 4"  (1.93 m), weight 215 lb (97.5 kg), SpO2 98 %.   General: No apparent distress alert and oriented x3 mood and affect normal, dressed appropriately.  HEENT: Pupils equal, extraocular movements intact  Respiratory: Patient's speak in full sentences and does not appear short of breath   Cardiovascular: No lower extremity edema, non tender, no erythema  MSK:  Back does have loss of lordosis.  Some tenderness to palpation in the paraspinal musculature.  Patient does have tightness more in the thoracolumbar juncture.  Patient's neck also has some limited sidebending bilaterally.  Osteopathic findings  C2 flexed rotated and side bent right C7 flexed rotated and side bent left T3 extended rotated and side bent right inhaled rib T8 extended rotated and side bent left L2 flexed rotated and side bent right Sacrum right on right       Assessment and Plan:  Sacroiliac pain Continues to have significant sacroiliac pain as well as low back pain.  The patient does have low testosterone and would like to have it checked again.  Is wondering if he should go to endocrinology or not.  We will see if it is low again.  We discussed again about the sedimentation rate and will see which way we potentially can can ago.  We discussed icing regimen and home exercises,    Nonallopathic problems  Decision today to treat with OMT was based on Physical Exam  After verbal consent patient was treated with HVLA, ME, FPR techniques in cervical, rib, thoracic, lumbar, and sacral  areas  Patient tolerated the procedure well with improvement in symptoms  Patient given exercises, stretches and lifestyle modifications  See medications in patient instructions if given  Patient will follow up in 4-8 weeks    The above documentation has been reviewed and is accurate and complete [name], DO  Note: This dictation was prepared with Dragon dictation along with smaller phrase technology. Any transcriptional errors that result from this process are unintentional.

## 2021-12-17 ENCOUNTER — Encounter: Payer: Self-pay | Admitting: Family Medicine

## 2021-12-17 ENCOUNTER — Ambulatory Visit: Payer: BC Managed Care – PPO | Admitting: Family Medicine

## 2021-12-17 VITALS — BP 118/86 | HR 80 | Ht 76.0 in | Wt 215.0 lb

## 2021-12-17 DIAGNOSIS — F419 Anxiety disorder, unspecified: Secondary | ICD-10-CM | POA: Diagnosis not present

## 2021-12-17 DIAGNOSIS — M533 Sacrococcygeal disorders, not elsewhere classified: Secondary | ICD-10-CM | POA: Diagnosis not present

## 2021-12-17 DIAGNOSIS — M9901 Segmental and somatic dysfunction of cervical region: Secondary | ICD-10-CM

## 2021-12-17 DIAGNOSIS — M9902 Segmental and somatic dysfunction of thoracic region: Secondary | ICD-10-CM

## 2021-12-17 DIAGNOSIS — M2550 Pain in unspecified joint: Secondary | ICD-10-CM

## 2021-12-17 DIAGNOSIS — M9904 Segmental and somatic dysfunction of sacral region: Secondary | ICD-10-CM

## 2021-12-17 DIAGNOSIS — M9903 Segmental and somatic dysfunction of lumbar region: Secondary | ICD-10-CM

## 2021-12-17 DIAGNOSIS — M9908 Segmental and somatic dysfunction of rib cage: Secondary | ICD-10-CM

## 2021-12-17 DIAGNOSIS — K5090 Crohn's disease, unspecified, without complications: Secondary | ICD-10-CM | POA: Diagnosis not present

## 2021-12-17 LAB — SEDIMENTATION RATE: Sed Rate: 27 mm/hr — ABNORMAL HIGH (ref 0–15)

## 2021-12-17 LAB — TESTOSTERONE: Testosterone: 259.04 ng/dL — ABNORMAL LOW (ref 300.00–890.00)

## 2021-12-17 MED ORDER — HYDROXYZINE HCL 25 MG PO TABS: 25.0000 mg | ORAL_TABLET | Freq: Three times a day (TID) | ORAL | 0 refills | Status: DC | PRN

## 2021-12-17 NOTE — Patient Instructions (Signed)
Labs today Hydroxizine increased See me again in 6-8 weeks

## 2021-12-17 NOTE — Assessment & Plan Note (Signed)
Being followed by gastroenterology and is having an endoscopy in the near future. If he does have significant gastritis.  Continuing on the omeprazole.

## 2021-12-17 NOTE — Assessment & Plan Note (Signed)
Patient is titrating down off of the Celexa and not noticing any significant improvement in his GI function.  We discussed hydroxyzine in the meantime for any of the breakthrough anxiety that I think also seems to amplify some of patient's chronic pain.

## 2021-12-17 NOTE — Assessment & Plan Note (Signed)
Continues to have significant sacroiliac pain as well as low back pain.  The patient does have low testosterone and would like to have it checked again.  Is wondering if he should go to endocrinology or not.  We will see if it is low again.  We discussed again about the sedimentation rate and will see which way we potentially can can ago.  We discussed icing regimen and home exercises,

## 2021-12-29 ENCOUNTER — Other Ambulatory Visit: Payer: Self-pay | Admitting: Family Medicine

## 2021-12-30 DIAGNOSIS — K3189 Other diseases of stomach and duodenum: Secondary | ICD-10-CM | POA: Diagnosis not present

## 2021-12-30 DIAGNOSIS — K219 Gastro-esophageal reflux disease without esophagitis: Secondary | ICD-10-CM | POA: Diagnosis not present

## 2022-01-08 ENCOUNTER — Other Ambulatory Visit: Payer: Self-pay | Admitting: Family Medicine

## 2022-01-26 NOTE — Progress Notes (Unsigned)
[place] Phone: [phone] Subjective:   Johnny [name], am serving as a scribe for Dr. Hulan Saas.  I'm seeing this patient by the request  of:  Johnny Barrack, MD  CC: Back and neck pain follow-up  [id number]  Johnny [name] is a 41 y.o. male coming in with complaint of back and neck pain. OMT 12/17/2021. Patient states that he has pain throughout entire spine. [name] change from last visit.  Still having significant discomfort on a regular basis.  Will taking the gabapentin 400 at night as well as the Zanaflex.  Medications patient has been prescribed: Gabapentin, Hydroxizine, Vit D  Taking: Yes         Reviewed prior external information including notes and imaging from previsou exam, outside providers and external EMR if available.   As well as notes that were available from care everywhere and other healthcare systems.  Past medical history, social, surgical and family history all reviewed in electronic medical record.  [name] information unless stated regarding to the chief complaint.   Past Medical History:  Diagnosis Date   Crohn's disease ([place])    GERD (gastroesophageal reflux disease)    Hyperlipidemia    Hypertension     Allergies  Allergen Reactions   Other Diarrhea and Nausea And Vomiting    Champagne    Codeine Nausea Only     Review of Systems:  [name], visual changes, nausea, vomiting, diarrhea, constipation, dizziness, abdominal pain, skin rash, fevers, chills, night sweats, weight loss, swollen lymph nodes, , joint swelling, chest pain, shortness of breath, mood changes. POSITIVE muscle aches, body aches locally weight has stabilized  Objective  Blood pressure 118/84, pulse 73, height 6' 4"  (1.93 m), weight 228 lb (103.4 kg), SpO2 98 %.   General: [name] apparent distress alert and oriented x3 mood and affect normal, dressed appropriately.  HEENT: Pupils equal,  extraocular movements intact  Respiratory: Patient's speak in full sentences and does not appear short of breath  Cardiovascular: [name] lower extremity edema, non tender, [name] erythema  Low back exam does have some loss of lordosis.  Some tenderness to palpation in the paraspinal musculature.  Tightness with Johnny [name] right greater than left.  Negative straight leg test but does have worsening pain with greater than 5 degrees of extension.  Osteopathic findings  C3 flexed rotated and side bent right C5 flexed rotated and side bent left T5 extended rotated and side bent right inhaled rib T6 extended rotated and side bent left L2 flexed rotated and side bent right Sacrum right on right       Assessment and Plan:  Low back pain Chronic low back pain.  Continuing to have discomfort on a daily basis.  Did get significant improvement in range of motion noted today.  The patient has had the sacroiliac pain and has responded to injections previously as well.  Patient does have the MRI showing some mild L5 and S1 nerve root impingements that could continue to be the cause of some of his discomfort in the lower back.  Discussed the Zanaflex 2 mg to take at night.  Continue to monitor otherwise.  Follow-up with me again in 6 to 8 weeks.  Low testosterone Multiple labs with low testosterone and will refer to urology to see if they want supplementation.    Nonallopathic problems  Decision today to treat with OMT was based on Physical Exam  After verbal  consent patient was treated with HVLA, ME, FPR techniques in cervical, rib, thoracic, lumbar, and sacral  areas  Patient tolerated the procedure well with improvement in symptoms  Patient given exercises, stretches and lifestyle modifications  See medications in patient instructions if given  Patient will follow up in 4-8 weeks     The above documentation has been reviewed and is accurate and complete Johnny Pulley, DO         Note: This  dictation was prepared with Dragon dictation along with smaller phrase technology. Any transcriptional errors that result from this process are unintentional.

## 2022-01-28 ENCOUNTER — Ambulatory Visit: Payer: BC Managed Care – PPO | Admitting: Family Medicine

## 2022-01-28 VITALS — BP 118/84 | HR 73 | Ht 76.0 in | Wt 228.0 lb

## 2022-01-28 DIAGNOSIS — M9908 Segmental and somatic dysfunction of rib cage: Secondary | ICD-10-CM | POA: Diagnosis not present

## 2022-01-28 DIAGNOSIS — M9901 Segmental and somatic dysfunction of cervical region: Secondary | ICD-10-CM | POA: Diagnosis not present

## 2022-01-28 DIAGNOSIS — M9904 Segmental and somatic dysfunction of sacral region: Secondary | ICD-10-CM

## 2022-01-28 DIAGNOSIS — G8929 Other chronic pain: Secondary | ICD-10-CM

## 2022-01-28 DIAGNOSIS — R7989 Other specified abnormal findings of blood chemistry: Secondary | ICD-10-CM | POA: Insufficient documentation

## 2022-01-28 DIAGNOSIS — M9903 Segmental and somatic dysfunction of lumbar region: Secondary | ICD-10-CM | POA: Diagnosis not present

## 2022-01-28 DIAGNOSIS — M545 Low back pain, unspecified: Secondary | ICD-10-CM

## 2022-01-28 DIAGNOSIS — M9902 Segmental and somatic dysfunction of thoracic region: Secondary | ICD-10-CM

## 2022-01-28 NOTE — Assessment & Plan Note (Signed)
Chronic low back pain.  Continuing to have discomfort on a daily basis.  Did get significant improvement in range of motion noted today.  The patient has had the sacroiliac pain and has responded to injections previously as well.  Patient does have the MRI showing some mild L5 and S1 nerve root impingements that could continue to be the cause of some of his discomfort in the lower back.  Discussed the Zanaflex 2 mg to take at night.  Continue to monitor otherwise.  Follow-up with me again in 6 to 8 weeks.

## 2022-01-28 NOTE — Patient Instructions (Signed)
Referral to [facility] See me in 5-6 weeks

## 2022-01-28 NOTE — Assessment & Plan Note (Signed)
Multiple labs with low testosterone and will refer to urology to see if they want supplementation.

## 2022-02-13 ENCOUNTER — Other Ambulatory Visit: Payer: Self-pay | Admitting: Family Medicine

## 2022-02-19 ENCOUNTER — Other Ambulatory Visit: Payer: Self-pay | Admitting: Family Medicine

## 2022-03-08 NOTE — Progress Notes (Signed)
[name] [place] Phone: [phone] Subjective:   Johnny [name], am serving as a scribe for Dr. Hulan Saas.  I'm seeing this patient by the request  of:  Vivi Barrack, MD  CC: back and neck pain follow up   [id number]  Johnny [name] is a 42 y.o. male coming in with complaint of back and neck pain. OMT 01/28/2022.  Continues to discomfort and pain in the lower back.  Still out of proportion.  Patient continues to have elevated anxiety like feelings as well.  Does not know if this is from escitalopram or anything else.  Patient is having difficulty with the shakiness all the time.  Feels like he is stressed but does not know if that is the continuation of it all the time.  Medications patient has been prescribed: Hydroxizine, Vit D  Taking: hydroxyzine Vit D gabapentin         Reviewed prior external information including notes and imaging from previsou exam, outside providers and external EMR if available.   As well as notes that were available from care everywhere and other healthcare systems.  Past medical history, social, surgical and family history all reviewed in electronic medical record.  No pertanent information unless stated regarding to the chief complaint.   Past Medical History:  Diagnosis Date   Crohn's disease ([place])    GERD (gastroesophageal reflux disease)    Hyperlipidemia    Hypertension     Allergies  Allergen Reactions   Other Diarrhea and Nausea And Vomiting    Champagne    Codeine Nausea Only     Review of Systems:  No headache, visual changes, nausea, vomiting, diarrhea, constipation, dizziness, abdominal pain, skin rash, fevers, chills, night sweats, weight loss, swollen lymph nodes, body aches, joint swelling, chest pain, shortness of breath, mood changes. POSITIVE muscle aches, anxiety, anxious  Objective  Blood pressure (!) 130/90, pulse 85, height 6\' 4"  (1.93 m),  weight 234 lb (106.1 kg), SpO2 98 %.   General: No apparent distress alert and oriented x3 mood and affect normal, dressed appropriately.  HEENT: Pupils equal, extraocular movements intact patient does have significant fullness of the neck does have what appears to be a nodule of the left thyroid Respiratory: Patient's speak in full sentences and does not appear short of breath  Cardiovascular: No lower extremity edema, non tender, no erythema  MSK:  Back   Osteopathic findings  C2 flexed rotated and side bent right C5 flexed rotated and side bent left T3 extended rotated and side bent right inhaled rib T9 extended rotated and side bent left L1 flexed rotated and side bent right Sacrum right on right    Assessment and Plan:  Thyroid nodule left Patient's left lobe does have a fairly large thyroid nodule, very easy ultrasound that is not finalized but approximately at least 1 cm.  Will send for further evaluation and ultrasound and will get laboratory workup to make sure that this is not contributing at the moment.  Discussed icing regimen and home exercises for his back.  Patient has had difficulty with his mood changes with citalopram and is wondering if that is contributing as well.  We discussed that we could discontinue this medicine but will see what the laboratory findings show.  Low back pain Chronic low back pain.  Patient is off the Effexor and was doing better initially but now unfortunately on the escitalopram and not making any  significant improvement.  At the moment as well.  Discussed posture and ergonomics, wondering if anything else is contributing with patient potentially having a thyroid nodule and will get thyroid labs at this time.  Follow-up with me again in 6 to 8 weeks.    Nonallopathic problems  Decision today to treat with OMT was based on Physical Exam  After verbal consent patient was treated with HVLA, ME, FPR techniques in cervical, rib, thoracic, lumbar,  and sacral  areas  Patient tolerated the procedure well with improvement in symptoms  Patient given exercises, stretches and lifestyle modifications  See medications in patient instructions if given  Patient will follow up in 4-8 weeks     The above documentation has been reviewed and is accurate and complete [name], DO         Note: This dictation was prepared with Dragon dictation along with smaller phrase technology. Any transcriptional errors that result from this process are unintentional.

## 2022-03-09 ENCOUNTER — Other Ambulatory Visit: Payer: Self-pay | Admitting: Family Medicine

## 2022-03-11 ENCOUNTER — Ambulatory Visit: Payer: BC Managed Care – PPO | Admitting: Family Medicine

## 2022-03-11 VITALS — BP 130/90 | HR 85 | Ht 76.0 in | Wt 234.0 lb

## 2022-03-11 DIAGNOSIS — M545 Low back pain, unspecified: Secondary | ICD-10-CM

## 2022-03-11 DIAGNOSIS — M9908 Segmental and somatic dysfunction of rib cage: Secondary | ICD-10-CM

## 2022-03-11 DIAGNOSIS — M2550 Pain in unspecified joint: Secondary | ICD-10-CM

## 2022-03-11 DIAGNOSIS — M9902 Segmental and somatic dysfunction of thoracic region: Secondary | ICD-10-CM

## 2022-03-11 DIAGNOSIS — M9904 Segmental and somatic dysfunction of sacral region: Secondary | ICD-10-CM | POA: Diagnosis not present

## 2022-03-11 DIAGNOSIS — E041 Nontoxic single thyroid nodule: Secondary | ICD-10-CM | POA: Diagnosis not present

## 2022-03-11 DIAGNOSIS — G8929 Other chronic pain: Secondary | ICD-10-CM

## 2022-03-11 DIAGNOSIS — M9901 Segmental and somatic dysfunction of cervical region: Secondary | ICD-10-CM | POA: Diagnosis not present

## 2022-03-11 DIAGNOSIS — M9903 Segmental and somatic dysfunction of lumbar region: Secondary | ICD-10-CM | POA: Diagnosis not present

## 2022-03-11 LAB — T4, FREE: Free T4: 0.6 ng/dL (ref 0.60–1.60)

## 2022-03-11 LAB — T3, FREE: T3, Free: 3.2 pg/mL (ref 2.3–4.2)

## 2022-03-11 LAB — SEDIMENTATION RATE: Sed Rate: 17 mm/hr — ABNORMAL HIGH (ref 0–15)

## 2022-03-11 LAB — TSH: TSH: 1.94 u[IU]/mL (ref 0.35–5.50)

## 2022-03-11 NOTE — Patient Instructions (Addendum)
Good to see you  Get labs today  [place] ordered call [phone] to schedule  Follow up 5-6 weeks

## 2022-03-11 NOTE — Assessment & Plan Note (Signed)
Chronic low back pain.  Patient is off the Effexor and was doing better initially but now unfortunately on the escitalopram and not making any significant improvement.  At the moment as well.  Discussed posture and ergonomics, wondering if anything else is contributing with patient potentially having a thyroid nodule and will get thyroid labs at this time.  Follow-up with me again in 6 to 8 weeks.

## 2022-03-11 NOTE — Assessment & Plan Note (Signed)
Patient's left lobe does have a fairly large thyroid nodule, very easy ultrasound that is not finalized but approximately at least 1 cm.  Will send for further evaluation and ultrasound and will get laboratory workup to make sure that this is not contributing at the moment.  Discussed icing regimen and home exercises for his back.  Patient has had difficulty with his mood changes with citalopram and is wondering if that is contributing as well.  We discussed that we could discontinue this medicine but will see what the laboratory findings show.

## 2022-03-12 LAB — THYROID PEROXIDASE ANTIBODIES (TPO) (REFL): Thyroperoxidase Ab SerPl-aCnc: 316 IU/mL — ABNORMAL HIGH (ref <9)

## 2022-03-15 ENCOUNTER — Encounter: Payer: Self-pay | Admitting: Family Medicine

## 2022-03-16 ENCOUNTER — Other Ambulatory Visit: Payer: Self-pay

## 2022-03-16 DIAGNOSIS — R7989 Other specified abnormal findings of blood chemistry: Secondary | ICD-10-CM

## 2022-03-16 DIAGNOSIS — E041 Nontoxic single thyroid nodule: Secondary | ICD-10-CM

## 2022-03-29 ENCOUNTER — Ambulatory Visit
Admission: RE | Admit: 2022-03-29 | Discharge: 2022-03-29 | Disposition: A | Discharge status: Home / Self Care | Payer: BC Managed Care – PPO | Source: Ambulatory Visit | Attending: Family Medicine | Admitting: Family Medicine

## 2022-03-29 DIAGNOSIS — E041 Nontoxic single thyroid nodule: Secondary | ICD-10-CM | POA: Diagnosis not present

## 2022-04-03 ENCOUNTER — Other Ambulatory Visit: Payer: Self-pay | Admitting: Family Medicine

## 2022-04-21 NOTE — Progress Notes (Unsigned)
Johnny Johnny [name] Phone: [phone] Subjective:   Johnny Johnny [name], am serving as a scribe for Dr. Hulan Saas.  I'm seeing this patient by the request  of:  Johnny Barrack, MD  CC: Back and neck pain  [id number]  Johnny Johnny [name] is a 42 y.o. male coming in with complaint of back and neck pain. OMT 03/11/2022. Patient states that he is the same as the last visit. Patient is still waiting to see endocrinology.  He was found to have abnormal thyroid levels including antibodies that came there was Graves' disease.  Discussed different medications at this time but patient is blood pressure is within normal limits mild tachycardia at the moment.  Medications patient has been prescribed: Gabapentin, Zanaflex  Taking:         Reviewed prior external information including notes and imaging from previsou exam, outside providers and external EMR if available.   As well as notes that were available from care everywhere and other healthcare systems.  Past medical history, social, surgical and family history all reviewed in electronic medical record.  Johnny [name] information unless stated regarding to the chief complaint.   Past Medical History:  Diagnosis Date   Crohn's disease ([place])    GERD (gastroesophageal reflux disease)    Hyperlipidemia    Hypertension     Allergies  Allergen Reactions   Other Diarrhea and Nausea And Vomiting    Champagne    Codeine Nausea Only     Review of Systems:  Johnny [name], visual changes, nausea, vomiting, diarrhea, constipation, dizziness, abdominal pain, skin rash, fevers, chills, night sweats, weight loss, swollen lymph nodes, body aches, joint swelling, chest pain, shortness of breath, mood changes. POSITIVE muscle aches  Objective  Blood pressure 122/82, pulse 78, height 6' 4"$  (1.93 m), weight 241 lb (109.3 kg), SpO2 97 %.   General: Johnny [name] apparent distress alert and  oriented x3 mood and affect normal, dressed appropriately.  Respiratory: Patient's speak in full sentences and does not appear short of breath  Cardiovascular: Johnny [name] lower extremity edema, non tender, Johnny [name] erythema  Tender to palpation in the paraspinal musculature.  Patient does have some fullness noted of the neck.  Patient does have some low back pain noted on palpation today.  Osteopathic findings  C2 flexed rotated and side bent right C6 flexed rotated and side bent left T3 extended rotated and side bent right inhaled rib T9 extended rotated and side bent left L2 flexed rotated and side bent right Sacrum right on right       Assessment and Plan:  Sacroiliac pain Chronic problem with worsening symptoms again.  Discussed posture and ergonomics, discussed which activities to do and which ones to avoid.  Discussed medications with patient.  Continue current medication the patient has discontinued certain medications due to the prednisone again.  Thyroid nodule left Thyroid nodule noted in the ultrasound until February 2025    Nonallopathic problems  Decision today to treat with OMT was based on Physical Exam  After verbal consent patient was treated with HVLA, ME, FPR techniques in cervical, rib, thoracic, lumbar, and sacral  areas  Patient tolerated the procedure well with improvement in symptoms  Patient given exercises, stretches and lifestyle modifications  See medications in patient instructions if given  Patient will follow up in 4-8 weeks    The above documentation has been reviewed and is accurate and complete Johnny Pulley, DO  Note: This dictation was prepared with Dragon dictation along with smaller phrase technology. Any transcriptional errors that result from this process are unintentional.

## 2022-04-22 ENCOUNTER — Ambulatory Visit: Payer: BC Managed Care – PPO | Admitting: Family Medicine

## 2022-04-22 VITALS — BP 122/82 | HR 78 | Ht 76.0 in | Wt 241.0 lb

## 2022-04-22 DIAGNOSIS — M533 Sacrococcygeal disorders, not elsewhere classified: Secondary | ICD-10-CM

## 2022-04-22 DIAGNOSIS — M9908 Segmental and somatic dysfunction of rib cage: Secondary | ICD-10-CM | POA: Diagnosis not present

## 2022-04-22 DIAGNOSIS — M9902 Segmental and somatic dysfunction of thoracic region: Secondary | ICD-10-CM | POA: Diagnosis not present

## 2022-04-22 DIAGNOSIS — E041 Nontoxic single thyroid nodule: Secondary | ICD-10-CM | POA: Diagnosis not present

## 2022-04-22 DIAGNOSIS — M9901 Segmental and somatic dysfunction of cervical region: Secondary | ICD-10-CM | POA: Diagnosis not present

## 2022-04-22 DIAGNOSIS — M9904 Segmental and somatic dysfunction of sacral region: Secondary | ICD-10-CM | POA: Diagnosis not present

## 2022-04-22 DIAGNOSIS — M9903 Segmental and somatic dysfunction of lumbar region: Secondary | ICD-10-CM | POA: Diagnosis not present

## 2022-04-22 NOTE — Patient Instructions (Signed)
Good to see you See me in 2 months

## 2022-04-22 NOTE — Assessment & Plan Note (Signed)
Chronic problem with worsening symptoms again.  Discussed posture and ergonomics, discussed which activities to do and which ones to avoid.  Discussed medications with patient.  Continue current medication the patient has discontinued certain medications due to the prednisone again.

## 2022-04-22 NOTE — Assessment & Plan Note (Signed)
Thyroid nodule noted in the ultrasound until February 2025

## 2022-05-06 DIAGNOSIS — R946 Abnormal results of thyroid function studies: Secondary | ICD-10-CM | POA: Diagnosis not present

## 2022-05-06 DIAGNOSIS — R7989 Other specified abnormal findings of blood chemistry: Secondary | ICD-10-CM | POA: Diagnosis not present

## 2022-05-06 DIAGNOSIS — E063 Autoimmune thyroiditis: Secondary | ICD-10-CM | POA: Diagnosis not present

## 2022-05-06 DIAGNOSIS — L409 Psoriasis, unspecified: Secondary | ICD-10-CM | POA: Diagnosis not present

## 2022-05-06 DIAGNOSIS — K5090 Crohn's disease, unspecified, without complications: Secondary | ICD-10-CM | POA: Diagnosis not present

## 2022-06-03 ENCOUNTER — Other Ambulatory Visit: Payer: Self-pay | Admitting: Family Medicine

## 2022-06-16 NOTE — Progress Notes (Unsigned)
[name] [address] [place] [address] Phone: [phone] Subjective:   Johnny [name], am serving as a scribe for Dr. Antoine Primas.  I'm seeing this patient by the request  of:  Ardith Dark, MD  CC: back and neck pain   [name]  Johnny [name] is a 41 y.o. male coming in with complaint of back and neck pain. OTM 04/22/2022. Patient states that he is the same as last visit.  Patient has had some tightness overall.  Still has the back pain.  Medications patient has been prescribed: Vit D, Gabapentin       Reviewed prior external information including notes and imaging from previsou exam, outside providers and external EMR if available.   As well as notes that were available from care everywhere and other healthcare systems.  Past medical history, social, surgical and family history all reviewed in electronic medical record.  No pertanent information unless stated regarding to the chief complaint.   Past Medical History:  Diagnosis Date   Crohn's disease ([facility])    GERD (gastroesophageal reflux disease)    Hyperlipidemia    Hypertension     Allergies  Allergen Reactions   Other Diarrhea and Nausea And Vomiting    Champagne    Codeine Nausea Only     Review of Systems:  No headache, visual changes, nausea, vomiting, diarrhea, constipation, dizziness, abdominal pain, skin rash, fevers, chills, night sweats, weight loss, swollen lymph nodes, body aches, joint swelling, chest pain, shortness of breath, mood changes. POSITIVE muscle aches  Objective  Blood pressure 118/84, pulse 90, height  (1.93 m), weight 244 lb (110.7 kg), SpO2 97 %.   General: No apparent distress alert and oriented x3 mood and affect normal, dressed appropriately.  HEENT: Pupils equal, extraocular movements intact  Respiratory: Patient's speak in full sentences and does not appear short of breath  Cardiovascular: No lower extremity edema, non  tender, no erythema  Low back does have some loss of lordosis noted.  Some tightness noted on the [facility] right greater than left.  Patient still has some worsening pain with extension of the back greater than 5 degrees.  No significant discomfort on the sides of the head.  Osteopathic findings  C3 flexed rotated and side bent right C5 flexed rotated and side bent left T3 extended rotated and side bent right inhaled rib T9 extended rotated and side bent left L1 flexed rotated and side bent right Sacrum forward flexion bilaterally       Assessment and Plan:  Sacroiliac pain Continues to have some discomfort and pain.  Discussed which activities to do and which ones to avoid.  Increase activity slowly.  Do not think we need to make any other significant changes at this time.  Follow-up with me again in 6 to 8 weeks.    Nonallopathic problems  Decision today to treat with OMT was based on Physical Exam  After verbal consent patient was treated with HVLA, ME, FPR techniques in cervical, rib, thoracic, lumbar, and sacral  areas  Patient tolerated the procedure well with improvement in symptoms  Patient given exercises, stretches and lifestyle modifications  See medications in patient instructions if given  Patient will follow up in 4-8 weeks    The above documentation has been reviewed and is accurate and complete Judi Saa, DO          Note: This dictation was prepared with Dragon dictation along with smaller  Company secretary. Any transcriptional errors that result from this process are unintentional.

## 2022-06-17 ENCOUNTER — Encounter: Payer: Self-pay | Admitting: Family Medicine

## 2022-06-17 ENCOUNTER — Ambulatory Visit: Payer: BC Managed Care – PPO | Admitting: Family Medicine

## 2022-06-17 VITALS — BP 118/84 | HR 90 | Ht 76.0 in | Wt 244.0 lb

## 2022-06-17 DIAGNOSIS — M533 Sacrococcygeal disorders, not elsewhere classified: Secondary | ICD-10-CM

## 2022-06-17 DIAGNOSIS — M9908 Segmental and somatic dysfunction of rib cage: Secondary | ICD-10-CM

## 2022-06-17 DIAGNOSIS — M9903 Segmental and somatic dysfunction of lumbar region: Secondary | ICD-10-CM

## 2022-06-17 DIAGNOSIS — M9902 Segmental and somatic dysfunction of thoracic region: Secondary | ICD-10-CM | POA: Diagnosis not present

## 2022-06-17 DIAGNOSIS — M9901 Segmental and somatic dysfunction of cervical region: Secondary | ICD-10-CM

## 2022-06-17 DIAGNOSIS — M9904 Segmental and somatic dysfunction of sacral region: Secondary | ICD-10-CM

## 2022-06-17 NOTE — Assessment & Plan Note (Signed)
Continues to have some discomfort and pain.  Discussed which activities to do and which ones to avoid.  Increase activity slowly.  Do not think we need to make any other significant changes at this time.  Follow-up with me again in 6 to 8 weeks.

## 2022-06-17 NOTE — Patient Instructions (Signed)
Good to see you Everything moved better than usual See me in 6-8 weeks

## 2022-08-04 NOTE — Progress Notes (Signed)
[name] [address] [place] [address] Phone: [phone] Subjective:   Johnny [name], am serving as a scribe for Dr. Antoine Primas.  I'm seeing this patient by the request  of:  Ardith Dark, MD  CC: Back and neck pain follow-up  [name]  [name] is a 42 y.o. male coming in with complaint of back and neck pain. OMT 06/17/2022.  Patient states that he is the same and might be stiffer.   Medications patient has been prescribed: Gabapentin, Zanaflex, Vit D  Taking:         Reviewed prior external information including notes and imaging from previsou exam, outside providers and external EMR if available.   As well as notes that were available from care everywhere and other healthcare systems.  Past medical history, social, surgical and family history all reviewed in electronic medical record.  No pertanent information unless stated regarding to the chief complaint.   Past Medical History:  Diagnosis Date   Crohn's disease ([facility])    GERD (gastroesophageal reflux disease)    Hyperlipidemia    Hypertension     Allergies  Allergen Reactions   Other Diarrhea and Nausea And Vomiting    Champagne    Codeine Nausea Only     Review of Systems:  No headache, visual changes, nausea, vomiting, diarrhea, constipation, dizziness, abdominal pain, skin rash, fevers, chills, night sweats, weight loss, swollen lymph nodes, body aches, joint swelling, chest pain, shortness of breath, mood changes. POSITIVE muscle aches  Objective  Blood pressure 114/84, pulse 77, height 6\' 4"  (1.93 m), weight 252 lb (114.3 kg), SpO2 98 %.   General: No apparent distress alert and oriented x3 mood and affect normal, dressed appropriately.  HEENT: Pupils equal, extraocular movements intact  Respiratory: Patient's speak in full sentences and does not appear short of breath  Cardiovascular: No lower extremity edema, non tender, no erythema   Back exam does have a loss lordosis noted.  Tenderness to palpation right greater than left.  Pain over the sacroiliac joints in the L5 area.  Osteopathic findings  C3 flexed rotated and side bent right C6 flexed rotated and side bent left T3 extended rotated and side bent right inhaled rib T8 extended rotated and side bent left L2 flexed rotated and side bent right L5 flexed rotated and side bent left Sacrum right on right       Assessment and Plan:  Low back pain Low back exam does have some loss lordosis noted.  Some tenderness to palpation in the paraspinal musculature.  Patient still not making complete resolution of pain with osteopathic manipulation.  Encourage patient to try medial branch block and see if this makes any significant improvement.  Discussed the possibility of radiofrequency ablation.  Can be difficult and has tried many other modalities and wants to avoid surgical intervention.  Follow-up with me again in 8 weeks    Nonallopathic problems  Decision today to treat with OMT was based on Physical Exam  After verbal consent patient was treated with HVLA, ME, FPR techniques in cervical, rib, thoracic, lumbar, and sacral  areas  Patient tolerated the procedure well with improvement in symptoms  Patient given exercises, stretches and lifestyle modifications  See medications in patient instructions if given  Patient will follow up in 4-8 weeks    The above documentation has been reviewed and is accurate and complete Judi Saa, DO  Note: This dictation was prepared with Dragon dictation along with smaller phrase technology. Any transcriptional errors that result from this process are unintentional.

## 2022-08-05 ENCOUNTER — Encounter: Payer: Self-pay | Admitting: Family Medicine

## 2022-08-05 ENCOUNTER — Ambulatory Visit: Payer: BC Managed Care – PPO | Admitting: Family Medicine

## 2022-08-05 VITALS — BP 114/84 | HR 77 | Ht 76.0 in | Wt 252.0 lb

## 2022-08-05 DIAGNOSIS — M9902 Segmental and somatic dysfunction of thoracic region: Secondary | ICD-10-CM

## 2022-08-05 DIAGNOSIS — M545 Low back pain, unspecified: Secondary | ICD-10-CM

## 2022-08-05 DIAGNOSIS — M47816 Spondylosis without myelopathy or radiculopathy, lumbar region: Secondary | ICD-10-CM

## 2022-08-05 DIAGNOSIS — M9901 Segmental and somatic dysfunction of cervical region: Secondary | ICD-10-CM | POA: Diagnosis not present

## 2022-08-05 DIAGNOSIS — M9903 Segmental and somatic dysfunction of lumbar region: Secondary | ICD-10-CM | POA: Diagnosis not present

## 2022-08-05 DIAGNOSIS — G8929 Other chronic pain: Secondary | ICD-10-CM

## 2022-08-05 DIAGNOSIS — M9908 Segmental and somatic dysfunction of rib cage: Secondary | ICD-10-CM | POA: Diagnosis not present

## 2022-08-05 DIAGNOSIS — M9904 Segmental and somatic dysfunction of sacral region: Secondary | ICD-10-CM

## 2022-08-05 MED ORDER — DICLOFENAC SODIUM 75 MG PO TBEC: DELAYED_RELEASE_TABLET | ORAL | 0 refills | Status: DC

## 2022-08-05 NOTE — Patient Instructions (Addendum)
Voltaren tabs 75mg   MMB [phone] See me again after [place]

## 2022-08-05 NOTE — Assessment & Plan Note (Signed)
Low back exam does have some loss lordosis noted.  Some tenderness to palpation in the paraspinal musculature.  Patient still not making complete resolution of pain with osteopathic manipulation.  Encourage patient to try medial branch block and see if this makes any significant improvement.  Discussed the possibility of radiofrequency ablation.  Can be difficult and has tried many other modalities and wants to avoid surgical intervention.  Follow-up with me again in 8 weeks

## 2022-08-09 DIAGNOSIS — E559 Vitamin D deficiency, unspecified: Secondary | ICD-10-CM | POA: Diagnosis not present

## 2022-08-09 DIAGNOSIS — R946 Abnormal results of thyroid function studies: Secondary | ICD-10-CM | POA: Diagnosis not present

## 2022-08-09 DIAGNOSIS — E041 Nontoxic single thyroid nodule: Secondary | ICD-10-CM | POA: Diagnosis not present

## 2022-08-09 DIAGNOSIS — E063 Autoimmune thyroiditis: Secondary | ICD-10-CM | POA: Diagnosis not present

## 2022-08-10 ENCOUNTER — Encounter: Payer: Self-pay | Admitting: Family Medicine

## 2022-08-10 ENCOUNTER — Other Ambulatory Visit: Payer: Self-pay | Admitting: Family Medicine

## 2022-08-10 DIAGNOSIS — M47816 Spondylosis without myelopathy or radiculopathy, lumbar region: Secondary | ICD-10-CM

## 2022-08-20 ENCOUNTER — Ambulatory Visit
Admission: RE | Admit: 2022-08-20 | Discharge: 2022-08-20 | Disposition: A | Discharge status: Home / Self Care | Payer: BC Managed Care – PPO | Source: Ambulatory Visit | Attending: Family Medicine | Admitting: Family Medicine

## 2022-08-20 ENCOUNTER — Other Ambulatory Visit: Payer: Self-pay | Admitting: Family Medicine

## 2022-08-20 DIAGNOSIS — M47816 Spondylosis without myelopathy or radiculopathy, lumbar region: Secondary | ICD-10-CM

## 2022-08-20 DIAGNOSIS — M25559 Pain in unspecified hip: Secondary | ICD-10-CM | POA: Diagnosis not present

## 2022-08-20 DIAGNOSIS — M545 Low back pain, unspecified: Secondary | ICD-10-CM | POA: Diagnosis not present

## 2022-08-20 NOTE — Discharge Instructions (Signed)
Medial Branch Block Discharge Instructions  Take over-the-counter and prescription medicines only as told by your health care provider.  Do not drive the day of your procedure  Return to your normal activities as told by your health care provider.   If injection site is sore you may ice the area for 20 minutes, 2-3 times a day.   Check your injection site every day for signs of infection. Check for: Redness, swelling, or pain. Fluid or blood. Warmth. Pus or a bad smell.  Please contact our office at [phone] if: You have a fever or chills. You have any signs of infection. You develop any numbness or weakness.   Thank you for visiting our office.  

## 2022-08-30 ENCOUNTER — Other Ambulatory Visit: Payer: Self-pay | Admitting: Family Medicine

## 2022-09-01 ENCOUNTER — Telehealth: Payer: Self-pay | Admitting: Family Medicine

## 2022-09-01 ENCOUNTER — Other Ambulatory Visit: Payer: Self-pay | Admitting: Family Medicine

## 2022-09-01 DIAGNOSIS — M47816 Spondylosis without myelopathy or radiculopathy, lumbar region: Secondary | ICD-10-CM

## 2022-09-01 NOTE — Telephone Encounter (Signed)
Victorino Dike from [facility] called stating that the recent facet injection order was denied by insurance due to the needing a second medial branch block.  Victorino Dike went ahead and put in the order to be signed by Dr Katrinka Blazing. Johnny [name] is scheduled for the procedure tomorrow.

## 2022-09-01 NOTE — Telephone Encounter (Signed)
Pt needs refills of Voltaren, Gabapentin and Vit D to [facility] on [address].  Pt states [facility] was "unable to contact us" to request these refills.

## 2022-09-02 ENCOUNTER — Other Ambulatory Visit: Payer: Self-pay | Admitting: Family Medicine

## 2022-09-02 ENCOUNTER — Ambulatory Visit
Admission: RE | Admit: 2022-09-02 | Discharge: 2022-09-02 | Disposition: A | Discharge status: Home / Self Care | Payer: BC Managed Care – PPO | Source: Ambulatory Visit | Attending: Family Medicine | Admitting: Family Medicine

## 2022-09-02 DIAGNOSIS — M47816 Spondylosis without myelopathy or radiculopathy, lumbar region: Secondary | ICD-10-CM

## 2022-09-02 DIAGNOSIS — M25559 Pain in unspecified hip: Secondary | ICD-10-CM | POA: Diagnosis not present

## 2022-09-02 DIAGNOSIS — M545 Low back pain, unspecified: Secondary | ICD-10-CM | POA: Diagnosis not present

## 2022-09-02 NOTE — Telephone Encounter (Signed)
Pt notified via V/M

## 2022-09-02 NOTE — Discharge Instructions (Signed)
Medial Branch Block Discharge Instructions  Take over-the-counter and prescription medicines only as told by your health care provider.  Do not drive the day of your procedure  Return to your normal activities as told by your health care provider.   If injection site is sore you may ice the area for 20 minutes, 2-3 times a day.   Check your injection site every day for signs of infection. Check for: Redness, swelling, or pain. Fluid or blood. Warmth. Pus or a bad smell.  Please contact our office at [phone] if: You have a fever or chills. You have any signs of infection. You develop any numbness or weakness.   Thank you for visiting our office.  

## 2022-09-02 NOTE — Telephone Encounter (Signed)
Prescription were filled

## 2022-09-22 NOTE — Progress Notes (Deleted)
  [name] [address] [place] [address] Phone: [phone] Subjective:    I'm seeing this patient by the request  of:  Ardith Dark, MD  CC:   [id number]  LAZAROS PROVENCAL is a 42 y.o. male coming in with complaint of back and neck pain. OMT 08/05/2022. MBB 09/02/2022. RFA scheduled for 09/27/2022. Patient states   Medications patient has been prescribed: Vit D, Voltaren  Taking:         Reviewed prior external information including notes and imaging from previsou exam, outside providers and external EMR if available.   As well as notes that were available from care everywhere and other healthcare systems.  Past medical history, social, surgical and family history all reviewed in electronic medical record.  No pertanent information unless stated regarding to the chief complaint.   Past Medical History:  Diagnosis Date   Crohn's disease ([facility])    GERD (gastroesophageal reflux disease)    Hyperlipidemia    Hypertension     Allergies  Allergen Reactions   Other Diarrhea and Nausea And Vomiting    Champagne    Codeine Nausea Only     Review of Systems:  No headache, visual changes, nausea, vomiting, diarrhea, constipation, dizziness, abdominal pain, skin rash, fevers, chills, night sweats, weight loss, swollen lymph nodes, body aches, joint swelling, chest pain, shortness of breath, mood changes. POSITIVE muscle aches  Objective  There were no vitals taken for this visit.   General: No apparent distress alert and oriented x3 mood and affect normal, dressed appropriately.  HEENT: Pupils equal, extraocular movements intact  Respiratory: Patient's speak in full sentences and does not appear short of breath  Cardiovascular: No lower extremity edema, non tender, no erythema  Gait MSK:  Back   Osteopathic findings  C2 flexed rotated and side bent right C6 flexed rotated and side bent left T3 extended rotated and side bent  right inhaled rib T9 extended rotated and side bent left L2 flexed rotated and side bent right Sacrum right on right       Assessment and Plan:  No problem-specific Assessment & Plan notes found for this encounter.    Nonallopathic problems  Decision today to treat with OMT was based on Physical Exam  After verbal consent patient was treated with HVLA, ME, FPR techniques in cervical, rib, thoracic, lumbar, and sacral  areas  Patient tolerated the procedure well with improvement in symptoms  Patient given exercises, stretches and lifestyle modifications  See medications in patient instructions if given  Patient will follow up in 4-8 weeks             Note: This dictation was prepared with Dragon dictation along with smaller phrase technology. Any transcriptional errors that result from this process are unintentional.

## 2022-09-23 ENCOUNTER — Ambulatory Visit: Payer: BC Managed Care – PPO | Admitting: Family Medicine

## 2022-09-27 ENCOUNTER — Other Ambulatory Visit: Payer: Self-pay | Admitting: Family Medicine

## 2022-09-27 ENCOUNTER — Ambulatory Visit
Admission: RE | Admit: 2022-09-27 | Discharge: 2022-09-27 | Disposition: A | Discharge status: Home / Self Care | Payer: BC Managed Care – PPO | Source: Ambulatory Visit | Attending: Family Medicine | Admitting: Family Medicine

## 2022-09-27 DIAGNOSIS — M47816 Spondylosis without myelopathy or radiculopathy, lumbar region: Secondary | ICD-10-CM

## 2022-09-27 DIAGNOSIS — M5387 Other specified dorsopathies, lumbosacral region: Secondary | ICD-10-CM | POA: Diagnosis not present

## 2022-09-27 MED ORDER — KETOROLAC TROMETHAMINE 30 MG/ML IJ SOLN
30.0000 mg | Freq: Once | INTRAMUSCULAR | Status: AC
  Administered 2022-09-27: 30 mg via INTRAVENOUS

## 2022-09-27 MED ORDER — MIDAZOLAM HCL 2 MG/2ML IJ SOLN
1.0000 mg | INTRAMUSCULAR | Status: DC | PRN
  Administered 2022-09-27: 0.5 mg via INTRAVENOUS
  Administered 2022-09-27 (×2): 1 mg via INTRAVENOUS
  Administered 2022-09-27: 0.5 mg via INTRAVENOUS

## 2022-09-27 MED ORDER — FENTANYL CITRATE PF 50 MCG/ML IJ SOSY
25.0000 ug | PREFILLED_SYRINGE | INTRAMUSCULAR | Status: DC | PRN
  Administered 2022-09-27 (×2): 25 ug via INTRAVENOUS
  Administered 2022-09-27: 50 ug via INTRAVENOUS
  Administered 2022-09-27 (×2): 25 ug via INTRAVENOUS
  Administered 2022-09-27: 50 ug via INTRAVENOUS

## 2022-09-27 MED ORDER — METHYLPREDNISOLONE ACETATE 40 MG/ML INJ SUSP (RADIOLOG
120.0000 mg | Freq: Once | INTRAMUSCULAR | Status: AC
  Administered 2022-09-27: 120 mg via INTRALESIONAL

## 2022-09-27 MED ORDER — SODIUM CHLORIDE 0.9 % IV SOLN: INTRAVENOUS | Status: DC

## 2022-09-27 NOTE — Discharge Instructions (Signed)
Radio Frequency Ablation Post Procedure Discharge Instructions ? ?May resume a regular diet and any medications that you routinely take (including pain medications). ?No driving day of procedure. ?Upon discharge go home and rest for at least 4 hours.  May use an ice pack as needed to injection sites on back. ?Remove bandades later, today. ? ? ? ?Please contact our office at [phone] for the following symptoms: ? ?Fever greater than 100 degrees ?Increased swelling, pain, or redness at injection site. ? ? ?Thank you for visiting [facility].  ?

## 2022-09-27 NOTE — Progress Notes (Signed)
Pt back in nursing recovery area. Pt still drowsy from procedure but will wake up when spoken to. Pt follows commands, talks in complete sentences and has no complaints at this time. Pt will remain in nurses station until discharged by Radiologist.   

## 2022-09-29 DIAGNOSIS — E041 Nontoxic single thyroid nodule: Secondary | ICD-10-CM | POA: Diagnosis not present

## 2022-10-21 NOTE — Progress Notes (Signed)
[name] [address] [place] [address] Phone: [phone] Subjective:   INadine Counts, am serving as a scribe for Dr. Antoine Primas.  I'm seeing this patient by the request  of:  Ardith Dark, MD  CC: Back and neck pain follow-up  ION:[id number]  Johnny [name] is a 42 y.o. male coming in with complaint of back and neck pain. OMT on 08/05/2022. Patient states same per usual. No new concerns.  Medications patient has been prescribed: voltaren gabapentin hydroxyzine vit d  Taking:         Reviewed prior external information including notes and imaging from previsou exam, outside providers and external EMR if available.   As well as notes that were available from care everywhere and other healthcare systems.  Past medical history, social, surgical and family history all reviewed in electronic medical record.  No pertanent information unless stated regarding to the chief complaint.   Past Medical History:  Diagnosis Date   Crohn's disease (HCC)    GERD (gastroesophageal reflux disease)    Hyperlipidemia    Hypertension     Allergies  Allergen Reactions   Other Diarrhea and Nausea And Vomiting    Champagne    Codeine Nausea Only     Review of Systems:  No headache, visual changes, nausea, vomiting, diarrhea, constipation, dizziness, abdominal pain, skin rash, fevers, chills, night sweats, weight loss, swollen lymph nodes, body aches, joint swelling, chest pain, shortness of breath, mood changes. POSITIVE muscle aches  Objective  Blood pressure 130/86, height 6\' 4"  (1.93 m), weight 258 lb (117 kg).   General: No apparent distress alert and oriented x3 mood and affect normal, dressed appropriately.  HEENT: Pupils equal, extraocular movements intact  Respiratory: Patient's speak in full sentences and does not appear short of breath  Cardiovascular: No lower extremity edema, non tender, no erythema  Gait normal MSK:  Back  does have some loss of lordosis noted.  Does have some tenderness diffusely in the lumbar spine and mostly in the thoracolumbar junction  Osteopathic findings  C2 flexed rotated and side bent right C6 flexed rotated and side bent left T3 extended rotated and side bent right inhaled rib T9 extended rotated and side bent left L2 flexed rotated and side bent right \\L5  flexed rotated and side bent left. Sacrum right on right       Assessment and Plan:  Low back pain Patient was doing significantly better previously but unfortunately did not respond well to the ablation that we were hoping for.  He was responding more to the medial branch blocks previously.  Still hopeful that patient could make some improvement.  Discussed icing regimen and home exercise.  Discussed increasing activity slowly otherwise.  Follow-up with me again 6 to 8 weeks.  Does respond well to osteopathic manipulation.    Nonallopathic problems  Decision today to treat with OMT was based on Physical Exam  After verbal consent patient was treated with HVLA, ME, FPR techniques in cervical, rib, thoracic, lumbar, and sacral  areas  Patient tolerated the procedure well with improvement in symptoms  Patient given exercises, stretches and lifestyle modifications  See medications in patient instructions if given  Patient will follow up in 4-8 weeks     The above documentation has been reviewed and is accurate and complete Judi Saa, DO         Note: This dictation was prepared with Dragon dictation along with smaller phrase  technology. Any transcriptional errors that result from this process are unintentional.

## 2022-10-28 ENCOUNTER — Ambulatory Visit: Payer: BC Managed Care – PPO | Admitting: Family Medicine

## 2022-10-28 ENCOUNTER — Encounter: Payer: Self-pay | Admitting: Family Medicine

## 2022-10-28 VITALS — BP 130/86 | Ht 76.0 in | Wt 258.0 lb

## 2022-10-28 DIAGNOSIS — M9902 Segmental and somatic dysfunction of thoracic region: Secondary | ICD-10-CM | POA: Diagnosis not present

## 2022-10-28 DIAGNOSIS — M9904 Segmental and somatic dysfunction of sacral region: Secondary | ICD-10-CM | POA: Diagnosis not present

## 2022-10-28 DIAGNOSIS — M9903 Segmental and somatic dysfunction of lumbar region: Secondary | ICD-10-CM | POA: Diagnosis not present

## 2022-10-28 DIAGNOSIS — M9901 Segmental and somatic dysfunction of cervical region: Secondary | ICD-10-CM

## 2022-10-28 DIAGNOSIS — M9908 Segmental and somatic dysfunction of rib cage: Secondary | ICD-10-CM | POA: Diagnosis not present

## 2022-10-28 DIAGNOSIS — M545 Low back pain, unspecified: Secondary | ICD-10-CM | POA: Diagnosis not present

## 2022-10-28 DIAGNOSIS — G8929 Other chronic pain: Secondary | ICD-10-CM

## 2022-10-28 NOTE — Patient Instructions (Signed)
Good to see you! See you again in 6-8 weeks 

## 2022-10-28 NOTE — Assessment & Plan Note (Signed)
Patient was doing significantly better previously but unfortunately did not respond well to the ablation that we were hoping for.  He was responding more to the medial branch blocks previously.  Still hopeful that patient could make some improvement.  Discussed icing regimen and home exercise.  Discussed increasing activity slowly otherwise.  Follow-up with me again 6 to 8 weeks.  Does respond well to osteopathic manipulation.

## 2022-11-06 ENCOUNTER — Other Ambulatory Visit: Payer: Self-pay | Admitting: Family Medicine

## 2022-11-29 ENCOUNTER — Other Ambulatory Visit: Payer: Self-pay | Admitting: Family Medicine

## 2022-12-02 DIAGNOSIS — R110 Nausea: Secondary | ICD-10-CM | POA: Diagnosis not present

## 2022-12-02 DIAGNOSIS — K219 Gastro-esophageal reflux disease without esophagitis: Secondary | ICD-10-CM | POA: Diagnosis not present

## 2022-12-02 DIAGNOSIS — K50919 Crohn's disease, unspecified, with unspecified complications: Secondary | ICD-10-CM | POA: Diagnosis not present

## 2022-12-02 DIAGNOSIS — R634 Abnormal weight loss: Secondary | ICD-10-CM | POA: Diagnosis not present

## 2022-12-08 NOTE — Progress Notes (Unsigned)
[name] [address] [place] [address] Phone: [phone] Subjective:   INadine Counts, am serving as a scribe for Dr. Antoine Primas.  I'm seeing this patient by the request  of:  Ardith Dark, MD  CC: neck and back pain follow up   [id number]  Johnny [name] is a 42 y.o. male coming in with complaint of back and neck pain. OMT 10/28/2022. Patient states same per usual. No new concerns.  Recently saw his chiropractor who did have some mild radicular symptoms after seeing them.  Denies any weakness in the lower extremity.  Still if he flexes down or tries to pick up something too heavy he can have aggravating symptoms again.  Medications patient has been prescribed: Gabapentin, Vit D  Taking:         Reviewed prior external information including notes and imaging from previsou exam, outside providers and external EMR if available.   As well as notes that were available from care everywhere and other healthcare systems.  Past medical history, social, surgical and family history all reviewed in electronic medical record.  No pertanent information unless stated regarding to the chief complaint.   Past Medical History:  Diagnosis Date   Crohn's disease (HCC)    GERD (gastroesophageal reflux disease)    Hyperlipidemia    Hypertension     Allergies  Allergen Reactions   Other Diarrhea and Nausea And Vomiting    Champagne    Codeine Nausea Only     Review of Systems:  No headache, visual changes, nausea, vomiting, diarrhea, constipation, dizziness, abdominal pain, skin rash, fevers, chills, night sweats, weight loss, swollen lymph nodes, body aches, joint swelling, chest pain, shortness of breath, mood changes. POSITIVE muscle aches  Objective  Blood pressure (!) 124/90, pulse 81, height 6\' 4"  (1.93 m), weight 257 lb (116.6 kg), SpO2 97%.   General: No apparent distress alert and oriented x3 mood and affect normal, dressed  appropriately.  HEENT: Pupils equal, extraocular movements intact  Respiratory: Patient's speak in full sentences and does not appear short of breath  Cardiovascular: No lower extremity edema, non tender, no erythema  Low back exam does have some mild loss lordosis noted.  Some tenderness to palpation in the paraspinal musculature  Osteopathic findings  C2 flexed rotated and side bent right C6 flexed rotated and side bent left T3 extended rotated and side bent right inhaled rib T9 extended rotated and side bent left L2 flexed rotated and side bent right L4 flexed rotated and side bent left Sacrum right on right       Assessment and Plan:  Sacroiliac pain Mild sacroiliac pain noted.  Discussed which activities to do and which ones to avoid.  Increase activity slowly over the course of next several weeks.  Discussed which activities to do and which ones to avoid.  Increasing activity.  Did get good range of motion and movement today.  Will continue to monitor patient's low back pain.  Follow-up again in 6 to 8 weeks    Nonallopathic problems  Decision today to treat with OMT was based on Physical Exam  After verbal consent patient was treated with HVLA, ME, FPR techniques in cervical, rib, thoracic, lumbar, and sacral  areas  Patient tolerated the procedure well with improvement in symptoms  Patient given exercises, stretches and lifestyle modifications  See medications in patient instructions if given  Patient will follow up in 4-8 weeks  The above documentation has been reviewed and is accurate and complete Judi Saa, DO         Note: This dictation was prepared with Dragon dictation along with smaller phrase technology. Any transcriptional errors that result from this process are unintentional.

## 2022-12-09 ENCOUNTER — Encounter: Payer: Self-pay | Admitting: Family Medicine

## 2022-12-09 ENCOUNTER — Ambulatory Visit: Payer: BC Managed Care – PPO | Admitting: Family Medicine

## 2022-12-09 VITALS — BP 124/90 | HR 81 | Ht 76.0 in | Wt 257.0 lb

## 2022-12-09 DIAGNOSIS — M9904 Segmental and somatic dysfunction of sacral region: Secondary | ICD-10-CM

## 2022-12-09 DIAGNOSIS — M9903 Segmental and somatic dysfunction of lumbar region: Secondary | ICD-10-CM | POA: Diagnosis not present

## 2022-12-09 DIAGNOSIS — M533 Sacrococcygeal disorders, not elsewhere classified: Secondary | ICD-10-CM | POA: Diagnosis not present

## 2022-12-09 DIAGNOSIS — M9908 Segmental and somatic dysfunction of rib cage: Secondary | ICD-10-CM

## 2022-12-09 DIAGNOSIS — M9901 Segmental and somatic dysfunction of cervical region: Secondary | ICD-10-CM | POA: Diagnosis not present

## 2022-12-09 DIAGNOSIS — M9902 Segmental and somatic dysfunction of thoracic region: Secondary | ICD-10-CM

## 2022-12-09 NOTE — Patient Instructions (Signed)
Good to see you! Hopefully we got a good movement today Continue with core stability See you again in 6-8 weeks

## 2022-12-09 NOTE — Assessment & Plan Note (Signed)
Mild sacroiliac pain noted.  Discussed which activities to do and which ones to avoid.  Increase activity slowly over the course of next several weeks.  Discussed which activities to do and which ones to avoid.  Increasing activity.  Did get good range of motion and movement today.  Will continue to monitor patient's low back pain.  Follow-up again in 6 to 8 weeks

## 2022-12-11 ENCOUNTER — Other Ambulatory Visit: Payer: Self-pay | Admitting: Family Medicine

## 2022-12-13 DIAGNOSIS — E559 Vitamin D deficiency, unspecified: Secondary | ICD-10-CM | POA: Diagnosis not present

## 2022-12-13 DIAGNOSIS — R635 Abnormal weight gain: Secondary | ICD-10-CM | POA: Diagnosis not present

## 2022-12-13 DIAGNOSIS — R946 Abnormal results of thyroid function studies: Secondary | ICD-10-CM | POA: Diagnosis not present

## 2022-12-13 DIAGNOSIS — R7989 Other specified abnormal findings of blood chemistry: Secondary | ICD-10-CM | POA: Diagnosis not present

## 2022-12-13 DIAGNOSIS — R030 Elevated blood-pressure reading, without diagnosis of hypertension: Secondary | ICD-10-CM | POA: Diagnosis not present

## 2022-12-16 ENCOUNTER — Ambulatory Visit: Payer: BC Managed Care – PPO | Admitting: Family Medicine

## 2022-12-16 ENCOUNTER — Encounter: Payer: Self-pay | Admitting: Family Medicine

## 2022-12-16 VITALS — BP 136/88 | HR 75 | Temp 97.1°F | Ht 76.0 in | Wt 258.6 lb

## 2022-12-16 DIAGNOSIS — E559 Vitamin D deficiency, unspecified: Secondary | ICD-10-CM | POA: Diagnosis not present

## 2022-12-16 DIAGNOSIS — F419 Anxiety disorder, unspecified: Secondary | ICD-10-CM

## 2022-12-16 DIAGNOSIS — I10 Essential (primary) hypertension: Secondary | ICD-10-CM | POA: Diagnosis not present

## 2022-12-16 DIAGNOSIS — K219 Gastro-esophageal reflux disease without esophagitis: Secondary | ICD-10-CM

## 2022-12-16 DIAGNOSIS — Z23 Encounter for immunization: Secondary | ICD-10-CM | POA: Diagnosis not present

## 2022-12-16 DIAGNOSIS — K5090 Crohn's disease, unspecified, without complications: Secondary | ICD-10-CM

## 2022-12-16 DIAGNOSIS — Z0000 Encounter for general adult medical examination without abnormal findings: Secondary | ICD-10-CM

## 2022-12-16 DIAGNOSIS — R7303 Prediabetes: Secondary | ICD-10-CM

## 2022-12-16 DIAGNOSIS — E538 Deficiency of other specified B group vitamins: Secondary | ICD-10-CM

## 2022-12-16 DIAGNOSIS — E785 Hyperlipidemia, unspecified: Secondary | ICD-10-CM

## 2022-12-16 DIAGNOSIS — Z0001 Encounter for general adult medical examination with abnormal findings: Secondary | ICD-10-CM

## 2022-12-16 DIAGNOSIS — E063 Autoimmune thyroiditis: Secondary | ICD-10-CM

## 2022-12-16 LAB — COMPREHENSIVE METABOLIC PANEL
ALT: 35 U/L (ref 0–53)
AST: 22 U/L (ref 0–37)
Albumin: 4.3 g/dL (ref 3.5–5.2)
Alkaline Phosphatase: 62 U/L (ref 39–117)
BUN: 16 mg/dL (ref 6–23)
CO2: 29 meq/L (ref 19–32)
Calcium: 9.7 mg/dL (ref 8.4–10.5)
Chloride: 103 meq/L (ref 96–112)
Creatinine, Ser: 0.96 mg/dL (ref 0.40–1.50)
GFR: 96.88 mL/min (ref >60.00)
Glucose, Bld: 97 mg/dL (ref 70–99)
Potassium: 4.3 meq/L (ref 3.5–5.1)
Sodium: 138 meq/L (ref 135–145)
Total Bilirubin: 0.3 mg/dL (ref 0.2–1.2)
Total Protein: 7.1 g/dL (ref 6.0–8.3)

## 2022-12-16 LAB — CBC WITH DIFFERENTIAL/PLATELET
Basophils Absolute: 0.1 10*3/uL (ref 0.0–0.1)
Basophils Relative: 1.2 % (ref 0.0–3.0)
Eosinophils Absolute: 0.2 10*3/uL (ref 0.0–0.7)
Eosinophils Relative: 3.1 % (ref 0.0–5.0)
HCT: 44.5 % (ref 39.0–52.0)
Hemoglobin: 13.7 g/dL (ref 13.0–17.0)
Lymphocytes Relative: 40 % (ref 12.0–46.0)
Lymphs Abs: 2.2 10*3/uL (ref 0.7–4.0)
MCHC: 30.7 g/dL (ref 30.0–36.0)
MCV: 74.1 fL — ABNORMAL LOW (ref 78.0–100.0)
Monocytes Absolute: 0.5 10*3/uL (ref 0.1–1.0)
Monocytes Relative: 9.8 % (ref 3.0–12.0)
Neutro Abs: 2.5 10*3/uL (ref 1.4–7.7)
Neutrophils Relative %: 45.9 % (ref 43.0–77.0)
Platelets: 226 10*3/uL (ref 150.0–400.0)
RBC: 6.01 Mil/uL — ABNORMAL HIGH (ref 4.22–5.81)
RDW: 13.8 % (ref 11.5–15.5)
WBC: 5.5 10*3/uL (ref 4.0–10.5)

## 2022-12-16 LAB — C-REACTIVE PROTEIN: CRP: <1 mg/dL (ref 0.5–20.0)

## 2022-12-16 LAB — LIPID PANEL
Cholesterol: 253 mg/dL — ABNORMAL HIGH (ref 0–200)
HDL: 39.9 mg/dL (ref >39.00)
LDL Cholesterol: 150 mg/dL — ABNORMAL HIGH (ref 0–99)
NonHDL: 213.48
Total CHOL/HDL Ratio: 6
Triglycerides: 317 mg/dL — ABNORMAL HIGH (ref 0.0–149.0)
VLDL: 63.4 mg/dL — ABNORMAL HIGH (ref 0.0–40.0)

## 2022-12-16 LAB — HEMOGLOBIN A1C: Hgb A1c MFr Bld: 6.1 % (ref 4.6–6.5)

## 2022-12-16 LAB — TSH: TSH: 3.51 u[IU]/mL (ref 0.35–5.50)

## 2022-12-16 LAB — VITAMIN D 25 HYDROXY (VIT D DEFICIENCY, FRACTURES): VITD: 19.02 ng/mL — ABNORMAL LOW (ref 30.00–100.00)

## 2022-12-16 LAB — VITAMIN B12: Vitamin B-12: 238 pg/mL (ref 211–911)

## 2022-12-16 MED ORDER — AMLODIPINE BESYLATE 5 MG PO TABS: 5.0000 mg | ORAL_TABLET | Freq: Every day | ORAL | 3 refills | Status: DC

## 2022-12-16 MED ORDER — CITALOPRAM HYDROBROMIDE 10 MG PO TABS: 10.0000 mg | ORAL_TABLET | Freq: Every day | ORAL | 3 refills | Status: DC

## 2022-12-16 NOTE — Assessment & Plan Note (Signed)
Stable on omeprazole per GI.

## 2022-12-16 NOTE — Assessment & Plan Note (Signed)
Following with gastroenterology.  Symptoms are well-controlled on mesalamine.

## 2022-12-16 NOTE — Assessment & Plan Note (Signed)
Check TSH.  Follow with endocrinology.  On Synthroid 50 mcg daily.

## 2022-12-16 NOTE — Assessment & Plan Note (Signed)
Stable insulin to 2 mg daily.  Will refill today.

## 2022-12-16 NOTE — Assessment & Plan Note (Signed)
Check B12 

## 2022-12-16 NOTE — Progress Notes (Signed)
Chief Complaint:  Johnny [name] is a 43 y.o. male who presents today for his annual comprehensive physical exam.    Assessment/Plan:  Chronic Problems Addressed Today: Benign essential hypertension At goal today.  Continue amlodipine 5 mg daily.  Will refill today.  Dyslipidemia Check labs.  Has not tolerated Lipitor in the past due to myalgias.  If cholesterol levels are elevated would consider trial of simvastatin.  GERD (gastroesophageal reflux disease) Stable on omeprazole per GI.  Vitamin B12 deficiency Check B12.  Prediabetes Check A1c.  Anxiety Stable insulin to 2 mg daily.  Will refill today.  Vitamin D deficiency Check vitamin D.  Hashimoto thyroiditis Check TSH.  Follow with endocrinology.  On Synthroid 50 mcg daily.  Crohn's disease [facility]) Following with gastroenterology.  Symptoms are well-controlled on mesalamine.   Preventative Healthcare: Flu vaccine and Tetanus, Diphtheria, and Pertussis (Tdap) given today. Check labs.   Patient Counseling(The following top check blood work ics were reviewed and/or handout was given):  -Nutrition: Stressed importance of moderation in sodium/caffeine intake, saturated fat and cholesterol, caloric balance, sufficient intake of fresh fruits, vegetables, and fiber.  -Stressed the importance of regular exercise.   -Substance Abuse: Discussed cessation/primary prevention of tobacco, alcohol, or other drug use; driving or other dangerous activities under the influence; availability of treatment for abuse.   -Injury prevention: Discussed safety belts, safety helmets, smoke detector, smoking near bedding or upholstery.   -Sexuality: Discussed sexually transmitted diseases, partner selection, use of condoms, avoidance of unintended pregnancy and contraceptive alternatives.   -Dental health: Discussed importance of regular tooth brushing, flossing, and dental visits.  -Health maintenance and immunizations reviewed. Please refer to  Health maintenance section.  Return to care in 1 year for next preventative visit.     Subjective:  HPI:  He has no acute complaints today. Last seen about a year ago. Since our last visit he was started on synthroid for hashimoto thyroiditis. Following with endocrinology for this. He feels much better since starting on synthroid for this.   Lifestyle Diet: None specific. Trying to avoid foods that worsen his Crohn's Disease.  Exercise: None specific.      12/16/2022   11:26 AM  Depression screen PHQ 2/9  Decreased Interest 0  Down, Depressed, Hopeless 0  PHQ - 2 Score 0    There are no preventive care reminders to display for this patient.    ROS: Per HPI, otherwise a complete review of systems was negative.   PMH:  The following were reviewed and entered/updated in epic: Past Medical History:  Diagnosis Date   Crohn's disease (HCC)    GERD (gastroesophageal reflux disease)    Hyperlipidemia    Hypertension    Patient Active Problem List   Diagnosis Date Noted   Hashimoto thyroiditis 12/16/2022   Low testosterone 01/28/2022   Fatigue 11/19/2021   Anxiety 11/05/2021   Greater trochanteric bursitis of both hips 10/08/2021   Sacroiliac pain 12/03/2020   Nonallopathic lesion of sacral region 08/24/2018   Nonallopathic lesion of lumbosacral region 08/24/2018   Nonallopathic lesion of thoracic region 08/24/2018   Low back pain 07/27/2018   Vitamin D deficiency 11/29/2017   Prediabetes 12/24/2016   Tinnitus 12/24/2016   Crohn's disease (HCC) 06/11/2016   Eczema 06/11/2016   Fatty liver 06/11/2016   GERD (gastroesophageal reflux disease) 06/11/2016   IBS (irritable bowel syndrome) 06/11/2016   Vitamin B12 deficiency 06/11/2016   Dyslipidemia 05/25/2014   Benign essential hypertension 10/01/2013   Metabolic syndrome  12/10/2011   Past Surgical History:  Procedure Laterality Date   LIVER BIOPSY      History reviewed. No pertinent family  history.  Medications- reviewed and updated Current Outpatient Medications  Medication Sig Dispense Refill   betamethasone valerate ointment (VALISONE) 0.1 % Apply 1 application topically 2 (two) times daily. 30 g 0   diclofenac (VOLTAREN) 75 MG EC tablet TAKE 1 TABLET BY MOUTH 2 - 3 TIMES A DAY AS NEEDED. 30 tablet 0   gabapentin (NEURONTIN) 400 MG capsule TAKE 1 CAPSULE BY MOUTH EVERYDAY AT BEDTIME 90 capsule 1   hydrOXYzine (ATARAX) 25 MG tablet TAKE 1 TABLET BY MOUTH THREE TIMES A DAY AS NEEDED 270 tablet 1   levothyroxine (SYNTHROID) 50 MCG tablet Take by mouth.     mesalamine (LIALDA) 1.2 g EC tablet Take by mouth.     omeprazole (PRILOSEC) 40 MG capsule Take 1 capsule by mouth every morning.     tiZANidine (ZANAFLEX) 2 MG tablet TAKE 1 TABLET BY MOUTH EVERYDAY AT BEDTIME 90 tablet 1   triamcinolone cream (KENALOG) 0.5 % APPLY TOPICALLY 3 TIMES A DAY 30 g 0   Vitamin D, Ergocalciferol, (DRISDOL) 1.25 MG (50000 UNIT) CAPS capsule TAKE 1 CAPSULE (50,000 UNITS TOTAL) BY MOUTH EVERY 7 (SEVEN) DAYS 12 capsule 0   amLODipine (NORVASC) 5 MG tablet Take 1 tablet (5 mg total) by mouth daily. 90 tablet 3   citalopram (CELEXA) 10 MG tablet Take 1 tablet (10 mg total) by mouth daily. 90 tablet 3   No current facility-administered medications for this visit.    Allergies-reviewed and updated Allergies  Allergen Reactions   Other Diarrhea and Nausea And Vomiting    Champagne    Codeine Nausea Only    Social History   Socioeconomic History   Marital status: Married    Spouse name: Not on file   Number of children: Not on file   Years of education: Not on file   Highest education level: Not on file  Occupational History   Not on file  Tobacco Use   Smoking status: Never   Smokeless tobacco: Never  Vaping Use   Vaping status: Never Used  Substance and Sexual Activity   Alcohol use: No   Drug use: No   Sexual activity: Yes  Other Topics Concern   Not on file  Social History  Narrative   Not on file   Social Determinants of Health   Financial Resource Strain: Unknown (12/17/2020)   Received from [facility], [facility]   Overall Financial Resource Strain (CARDIA)    Difficulty of Paying Living Expenses: Patient declined  Food Insecurity: Unknown (12/17/2020)   Received from [facility], [facility]   Hunger Vital Sign    Worried About Running Out of Food in the Last Year: Patient declined    Ran Out of Food in the Last Year: Patient declined  Transportation Needs: Unknown (12/17/2020)   Received from [employer], [facility]   [facility] - Transportation    Lack of Transportation (Medical): Patient declined    Lack of Transportation (Non-Medical): Patient declined  Physical Activity: Unknown (12/17/2020)   Received from [facility], [facility]   Exercise Vital Sign    Days of Exercise per Week: Patient declined    Minutes of Exercise per Session: Patient declined  Stress: Unknown (12/17/2020)   Received from [facility], [facility]   [employer] of Occupational [facility] - Occupational Stress Questionnaire    Feeling of Stress :  Patient declined  Social Connections: Unknown (07/01/2021)   Received from [facility], [facility]   Social Network    Social Network: Not on file        Objective:  Physical Exam: BP 136/88   Pulse 75   Temp (!) 97.1 F (36.2 C) (Temporal)   Ht 6\' 4"  (1.93 m)   Wt 258 lb 9.6 oz (117.3 kg)   SpO2 100%   BMI 31.48 kg/m   Body mass index is 31.48 kg/m. Wt Readings from Last 3 Encounters:  12/16/22 258 lb 9.6 oz (117.3 kg)  12/09/22 257 lb (116.6 kg)  10/28/22 258 lb (117 kg)   Gen: NAD, resting comfortably HEENT: TMs normal bilaterally. OP clear. No thyromegaly noted.  CV: RRR with no murmurs appreciated Pulm: NWOB, CTAB with no crackles, wheezes, or rhonchi GI: Normal bowel sounds present. Soft, Nontender, Nondistended. MSK: no edema, cyanosis, or clubbing noted Skin: warm,  dry Neuro: CN2-12 grossly intact. Strength 5/5 in upper and lower extremities. Reflexes symmetric and intact bilaterally.  Psych: Normal affect and thought content     Glennis Borger M. Jimmey Ralph, MD 12/16/2022 12:01 PM

## 2022-12-16 NOTE — Patient Instructions (Signed)
It was very nice to see you today!  We will check blood work today.   I will refill your medications.  We gave your flu shot and tetanus shot today.  I will see back in year for your next physical.  Come back sooner if needed.  Return in about 1 year (around 12/16/2023) for Annual Physical.   Take care, Dr Jimmey Ralph  PLEASE NOTE:  If you had any lab tests, please let us know if you have not heard back within a few days. You may see your results on mychart before we have a chance to review them but we will give you a call once they are reviewed by [place].   If we ordered any referrals today, please let us know if you have not heard from their office within the next week.   If you had any urgent prescriptions sent in today, please check with the pharmacy within an hour of our visit to make sure the prescription was transmitted appropriately.   Please try these tips to maintain a healthy lifestyle:  Eat at least 3 REAL meals and 1-2 snacks per day.  Aim for no more than 5 hours between eating.  If you eat breakfast, please do so within one hour of getting up.   Each meal should contain half fruits/vegetables, one quarter protein, and one quarter carbs (no bigger than a computer mouse)  Cut down on sweet beverages. This includes juice, soda, and sweet tea.   Drink at least 1 glass of water with each meal and aim for at least 8 glasses per day  Exercise at least 150 minutes every week.    Preventive Care 30-84 Years Old, Male Preventive care refers to lifestyle choices and visits with your health care provider that can promote health and wellness. Preventive care visits are also called wellness exams. What can I expect for my preventive care visit? Counseling During your preventive care visit, your health care provider may ask about your: Medical history, including: Past medical problems. Family medical history. Current health, including: Emotional well-being. Home life and  relationship well-being. Sexual activity. Lifestyle, including: Alcohol, nicotine or tobacco, and drug use. Access to firearms. Diet, exercise, and sleep habits. Safety issues such as seatbelt and bike helmet use. Sunscreen use. Work and work Astronomer. Physical exam Your health care provider will check your: Height and weight. These may be used to calculate your BMI (body mass index). BMI is a measurement that tells if you are at a healthy weight. Waist circumference. This measures the distance around your waistline. This measurement also tells if you are at a healthy weight and may help predict your risk of certain diseases, such as type 2 diabetes and high blood pressure. Heart rate and blood pressure. Body temperature. Skin for abnormal spots. What immunizations do I need?  Vaccines are usually given at various ages, according to a schedule. Your health care provider will recommend vaccines for you based on your age, medical history, and lifestyle or other factors, such as travel or where you work. What tests do I need? Screening Your health care provider may recommend screening tests for certain conditions. This may include: Lipid and cholesterol levels. Diabetes screening. This is done by checking your blood sugar (glucose) after you have not eaten for a while (fasting). Hepatitis B test. Hepatitis C test. HIV (human immunodeficiency virus) test. STI (sexually transmitted infection) testing, if you are at risk. Lung cancer screening. Prostate cancer screening. Colorectal cancer screening. Talk with  your health care provider about your test results, treatment options, and if necessary, the need for more tests. Follow these instructions at home: Eating and drinking  Eat a diet that includes fresh fruits and vegetables, whole grains, lean protein, and low-fat dairy products. Take vitamin and mineral supplements as recommended by your health care provider. Do not drink  alcohol if your health care provider tells you not to drink. If you drink alcohol: Limit how much you have to 0-2 drinks a day. Know how much alcohol is in your drink. In the [place] one drink equals one 12 oz bottle of beer (355 mL), one 5 oz glass of wine (148 mL), or one 1 oz glass of hard liquor (44 mL). Lifestyle Brush your teeth every morning and night with fluoride toothpaste. Floss one time each day. Exercise for at least 30 minutes 5 or more days each week. Do not use any products that contain nicotine or tobacco. These products include cigarettes, chewing tobacco, and vaping devices, such as e-cigarettes. If you need help quitting, ask your health care provider. Do not use drugs. If you are sexually active, practice safe sex. Use a condom or other form of protection to prevent STIs. Take aspirin only as told by your health care provider. Make sure that you understand how much to take and what form to take. Work with your health care provider to find out whether it is safe and beneficial for you to take aspirin daily. Find healthy ways to manage stress, such as: Meditation, yoga, or listening to music. Journaling. Talking to a trusted person. Spending time with friends and family. Minimize exposure to UV radiation to reduce your risk of skin cancer. Safety Always wear your seat belt while driving or riding in a vehicle. Do not drive: If you have been drinking alcohol. Do not ride with someone who has been drinking. When you are tired or distracted. While texting. If you have been using any mind-altering substances or drugs. Wear a helmet and other protective equipment during sports activities. If you have firearms in your house, make sure you follow all gun safety procedures. What's next? Go to your health care provider once a year for an annual wellness visit. Ask your health care provider how often you should have your eyes and teeth checked. Stay up to date on all  vaccines. This information is not intended to replace advice given to you by your health care provider. Make sure you discuss any questions you have with your health care provider. Document Revised: 08/06/2020 Document Reviewed: 08/06/2020 Elsevier Patient Education  2024 [employer].

## 2022-12-16 NOTE — Assessment & Plan Note (Signed)
Check labs.  Has not tolerated Lipitor in the past due to myalgias.  If cholesterol levels are elevated would consider trial of simvastatin.

## 2022-12-16 NOTE — Assessment & Plan Note (Signed)
Check vitamin D. 

## 2022-12-16 NOTE — Assessment & Plan Note (Signed)
At goal today.  Continue amlodipine 5 mg daily.  Will refill today.

## 2022-12-16 NOTE — Assessment & Plan Note (Signed)
Check A1c. 

## 2022-12-20 NOTE — Progress Notes (Signed)
Cholesterol mildly elevated.  Blood sugar also a little bit more elevated than last year.  Do not need to start medications but he should continue to work on diet and exercise.  Vitamin D is low.  Recommend supplementation 5000 IUs daily.  We should recheck in 3 to 6 months.  Nothing else is stable and we can recheck in a year.

## 2023-01-10 ENCOUNTER — Encounter: Payer: BC Managed Care – PPO | Admitting: Family Medicine

## 2023-01-26 NOTE — Progress Notes (Unsigned)
  [name] [address] [place] [address] Phone: [phone] Subjective:   Johnny [name], am serving as a scribe for Dr. Antoine Primas.  I'm seeing this patient by the request  of:  Ardith Dark, MD  CC: Back and neck pain  [id number]  Johnny [name] is a 42 y.o. male coming in with complaint of back and neck pain. OMT 12/09/2022. Patient states same per usual. No new concerns.  Has been doing relatively well.  Not stopping him from any specific activities at the moment.  Still can be very uncomfortable from time to time.  Medications patient has been prescribed: Vit D  Taking:         Reviewed prior external information including notes and imaging from previsou exam, outside providers and external EMR if available.   As well as notes that were available from care everywhere and other healthcare systems.  Past medical history, social, surgical and family history all reviewed in electronic medical record.  No pertanent information unless stated regarding to the chief complaint.   Past Medical History:  Diagnosis Date   Crohn's disease (HCC)    GERD (gastroesophageal reflux disease)    Hyperlipidemia    Hypertension     Allergies  Allergen Reactions   Other Diarrhea and Nausea And Vomiting    Champagne    Codeine Nausea Only     Review of Systems:  No headache, visual changes, nausea, vomiting, diarrhea, constipation, dizziness, abdominal pain, skin rash, fevers, chills, night sweats, weight loss, swollen lymph nodes, body aches, joint swelling, chest pain, shortness of breath, mood changes. POSITIVE muscle aches  Objective  Blood pressure 116/84, pulse 74, height 6\' 4"  (1.93 m), weight 258 lb (117 kg), SpO2 95%.   General: No apparent distress alert and oriented x3 mood and affect normal, dressed appropriately.  HEENT: Pupils equal, extraocular movements intact  Respiratory: Patient's speak in full sentences and does  not appear short of breath  Cardiovascular: No lower extremity edema, non tender, no erythema  Gait MSK:  Back does have some loss lordosis noted.  Some tenderness to palpation in the paraspinal musculature.  Tightness noted  Osteopathic findings  C2 flexed rotated and side bent right C6 flexed rotated and side bent left T9 extended rotated and side bent left L4 flexed rotated and side bent right Sacrum right on right       Assessment and Plan:  Sacroiliac pain Overall doing relatively well.  Continues to have chronic pain.  Does respond relatively well to osteopathic manipulation.  Feel like we need to continue to see him at intervals to see how he is feeling.  Follow-up again 6 to 8 weeks    Nonallopathic problems  Decision today to treat with OMT was based on Physical Exam  After verbal consent patient was treated with HVLA, ME, FPR techniques in cervical, , thoracic, lumbar, and sacral  areas  Patient tolerated the procedure well with improvement in symptoms  Patient given exercises, stretches and lifestyle modifications  See medications in patient instructions if given  Patient will follow up in 4-8 weeks    The above documentation has been reviewed and is accurate and complete Johnny Saa, DO          Note: This dictation was prepared with Dragon dictation along with smaller phrase technology. Any transcriptional errors that result from this process are unintentional.

## 2023-01-27 ENCOUNTER — Encounter: Payer: Self-pay | Admitting: Family Medicine

## 2023-01-27 ENCOUNTER — Ambulatory Visit: Payer: BC Managed Care – PPO | Admitting: Family Medicine

## 2023-01-27 VITALS — BP 116/84 | HR 74 | Ht 76.0 in | Wt 258.0 lb

## 2023-01-27 DIAGNOSIS — M9901 Segmental and somatic dysfunction of cervical region: Secondary | ICD-10-CM | POA: Diagnosis not present

## 2023-01-27 DIAGNOSIS — M9902 Segmental and somatic dysfunction of thoracic region: Secondary | ICD-10-CM

## 2023-01-27 DIAGNOSIS — M9903 Segmental and somatic dysfunction of lumbar region: Secondary | ICD-10-CM

## 2023-01-27 DIAGNOSIS — M533 Sacrococcygeal disorders, not elsewhere classified: Secondary | ICD-10-CM | POA: Diagnosis not present

## 2023-01-27 DIAGNOSIS — M9904 Segmental and somatic dysfunction of sacral region: Secondary | ICD-10-CM | POA: Diagnosis not present

## 2023-01-27 NOTE — Assessment & Plan Note (Signed)
Overall doing relatively well.  Continues to have chronic pain.  Does respond relatively well to osteopathic manipulation.  Feel like we need to continue to see him at intervals to see how he is feeling.  Follow-up again 6 to 8 weeks

## 2023-01-27 NOTE — Patient Instructions (Signed)
Good to see you! Good luck with all the sports No big changes See you again in 2 months

## 2023-03-01 ENCOUNTER — Other Ambulatory Visit: Payer: Self-pay | Admitting: Family Medicine

## 2023-03-09 ENCOUNTER — Other Ambulatory Visit: Payer: Self-pay | Admitting: Family Medicine

## 2023-03-22 NOTE — Progress Notes (Unsigned)
[name] [address] [place] [address] Phone: [phone] Subjective:   Bruce Donath, am serving as a scribe for Dr. Antoine Primas.  I'm seeing this patient by the request  of:  Ardith Dark, MD  CC: Back and neck pain  [name]  Johnny [name] is a 43 y.o. male coming in with complaint of back and neck pain. OMT 01/27/2023. Patient states that he is the same as last visit.   Medications patient has been prescribed: Vit D  Taking:         Reviewed prior external information including notes and imaging from previsou exam, outside providers and external EMR if available.   As well as notes that were available from care everywhere and other healthcare systems.  Past medical history, social, surgical and family history all reviewed in electronic medical record.  No pertanent information unless stated regarding to the chief complaint.   Past Medical History:  Diagnosis Date   Crohn's disease (HCC)    GERD (gastroesophageal reflux disease)    Hyperlipidemia    Hypertension     Allergies  Allergen Reactions   Other Diarrhea and Nausea And Vomiting    Champagne    Codeine Nausea Only     Review of Systems:  No headache, visual changes, nausea, vomiting, diarrhea, constipation, dizziness, abdominal pain, skin rash, fevers, chills, night sweats, weight loss, swollen lymph nodes, body aches, joint swelling, chest pain, shortness of breath, mood changes. POSITIVE muscle aches  Objective  Blood pressure 122/82, pulse 71, height 6\' 4"  (1.93 m), weight 262 lb (118.8 kg), SpO2 96%.   General: No apparent distress alert and oriented x3 mood and affect normal, dressed appropriately.  HEENT: Pupils equal, extraocular movements intact  Respiratory: Patient's speak in full sentences and does not appear short of breath  Cardiovascular: No lower extremity edema, non tender, no erythema  MSK:  Back does have some loss lordosis and  some tenderness to palpation noted.  Patient is severely tender over the gluteal tendon and appears just at the musculotendinous junction on the right side.  Worsening pain with hip abductor strengthening.  Osteopathic findings  C2 flexed rotated and side bent right C6 flexed rotated and side bent left T3 extended rotated and side bent right inhaled rib T9 extended rotated and side bent left L2 flexed rotated and side bent right Sacrum right on right  After verbal consent patient was prepped with alcohol swab and with a 22-gauge half inch needle injected into the right gluteal area with a total of 2 cc of 0.5% Marcaine and 1 cc of Kenalog 40 mg/mL no blood loss.  Band-Aid placed.  Postinjection instructions given   Assessment and Plan:  Gluteal tendinitis of right buttock Patient given injection and tolerated the procedure well, discussed icing regimen and home exercises, which activities to do and which ones to avoid.  Increase activity slowly over the course of next several weeks.  Discussed with patient to see if this will make an improvement or not.  Still think it is some coming from the back.  Attempted osteopathic manipulation for the back as well today.  Discussed the potential for low-dose naltrexone if he continues to have discomfort and pain but we need to refer him to pain management.  Follow-up again in 6 to 8 weeks    Nonallopathic problems  Decision today to treat with OMT was based on Physical Exam  After verbal consent patient was treated with  HVLA, ME, FPR techniques in cervical, rib, thoracic, lumbar, and sacral  areas  Patient tolerated the procedure well with improvement in symptoms  Patient given exercises, stretches and lifestyle modifications  See medications in patient instructions if given  Patient will follow up in 4-8 weeks    The above documentation has been reviewed and is accurate and complete Judi Saa, DO          Note: This dictation  was prepared with Dragon dictation along with smaller phrase technology. Any transcriptional errors that result from this process are unintentional.

## 2023-03-24 ENCOUNTER — Ambulatory Visit: Payer: BC Managed Care – PPO | Admitting: Family Medicine

## 2023-03-24 ENCOUNTER — Encounter: Payer: Self-pay | Admitting: Family Medicine

## 2023-03-24 VITALS — BP 122/82 | HR 71 | Ht 76.0 in | Wt 262.0 lb

## 2023-03-24 DIAGNOSIS — M9908 Segmental and somatic dysfunction of rib cage: Secondary | ICD-10-CM | POA: Diagnosis not present

## 2023-03-24 DIAGNOSIS — M7601 Gluteal tendinitis, right hip: Secondary | ICD-10-CM

## 2023-03-24 DIAGNOSIS — M9904 Segmental and somatic dysfunction of sacral region: Secondary | ICD-10-CM | POA: Diagnosis not present

## 2023-03-24 DIAGNOSIS — M9902 Segmental and somatic dysfunction of thoracic region: Secondary | ICD-10-CM | POA: Diagnosis not present

## 2023-03-24 DIAGNOSIS — M9901 Segmental and somatic dysfunction of cervical region: Secondary | ICD-10-CM

## 2023-03-24 DIAGNOSIS — M9903 Segmental and somatic dysfunction of lumbar region: Secondary | ICD-10-CM

## 2023-03-24 NOTE — Patient Instructions (Signed)
Injected R glute today Low dose naltraxone  See me again in 6-8 weeks

## 2023-03-24 NOTE — Assessment & Plan Note (Addendum)
Patient given injection and tolerated the procedure well, discussed icing regimen and home exercises, which activities to do and which ones to avoid.  Increase activity slowly over the course of next several weeks.  Discussed with patient to see if this will make an improvement or not.  Still think it is some coming from the back.  Attempted osteopathic manipulation for the back as well today.  Discussed the potential for low-dose naltrexone if he continues to have discomfort and pain but we need to refer him to pain management.  Follow-up again in 6 to 8 weeks

## 2023-04-01 ENCOUNTER — Other Ambulatory Visit: Payer: Self-pay | Admitting: Family Medicine

## 2023-04-18 ENCOUNTER — Other Ambulatory Visit: Payer: Self-pay | Admitting: Family Medicine

## 2023-04-18 DIAGNOSIS — R718 Other abnormality of red blood cells: Secondary | ICD-10-CM | POA: Diagnosis not present

## 2023-04-18 DIAGNOSIS — E041 Nontoxic single thyroid nodule: Secondary | ICD-10-CM | POA: Diagnosis not present

## 2023-04-18 DIAGNOSIS — R7989 Other specified abnormal findings of blood chemistry: Secondary | ICD-10-CM | POA: Diagnosis not present

## 2023-04-18 DIAGNOSIS — R030 Elevated blood-pressure reading, without diagnosis of hypertension: Secondary | ICD-10-CM | POA: Diagnosis not present

## 2023-04-18 DIAGNOSIS — E063 Autoimmune thyroiditis: Secondary | ICD-10-CM | POA: Diagnosis not present

## 2023-05-18 NOTE — Progress Notes (Unsigned)
 [name] [address] [place] [address] Phone: [phone] Subjective:   INadine Counts, am serving as a scribe for Dr. Antoine Primas.  I'm seeing this patient by the request  of:  Ardith Dark, MD  CC: back and neck pain follow up   [name]  Johnny [name] is a 43 y.o. male coming in with complaint of back and neck pain. OMT 03/24/2023. Patient states same per usual. No new symptoms. Tightness still but nothing that is stopped him from activities.  Medications patient has been prescribed: Vit D, Hydroxizine, Gabapentin  Taking:         Reviewed prior external information including notes and imaging from previsou exam, outside providers and external EMR if available.   As well as notes that were available from care everywhere and other healthcare systems.  Past medical history, social, surgical and family history all reviewed in electronic medical record.  No pertanent information unless stated regarding to the chief complaint.   Past Medical History:  Diagnosis Date   Crohn's disease (HCC)    GERD (gastroesophageal reflux disease)    Hyperlipidemia    Hypertension     Allergies  Allergen Reactions   Other Diarrhea and Nausea And Vomiting    Champagne    Codeine Nausea Only     Review of Systems:  No headache, visual changes, nausea, vomiting, diarrhea, constipation, dizziness, abdominal pain, skin rash, fevers, chills, night sweats, weight loss, swollen lymph nodes, body aches, joint swelling, chest pain, shortness of breath, mood changes. POSITIVE muscle aches  Objective  Blood pressure (!) 132/90, pulse 73, height 6\' 4"  (1.93 m), weight 257 lb (116.6 kg), SpO2 97%.   General: No apparent distress alert and oriented x3 mood and affect normal, dressed appropriately.  HEENT: Pupils equal, extraocular movements intact  Respiratory: Patient's speak in full sentences and does not appear short of breath   Cardiovascular: No lower extremity edema, non tender, no erythema  Gait MSK:  Back does have some loss lordosis noted.  Some tenderness to palpation in the paraspinal musculature.  Negative straight leg test. Mild increase in discomfort with extension of the back.  Neck actually seems to be doing relatively well.  Osteopathic findings   T9 extended rotated and side bent left L2 flexed rotated and side bent right L7 flexed rotated and side bent left Sacrum right on right       Assessment and Plan:  Sacroiliac pain Continues to have difficulty of the sacroiliac joint as well as ongoing facet arthritic changes.  Discussed icing exercises, follow-up in 6 to 8 weeks responds well to osteopathic manipulation.  No medication changes  Vitamin B12 deficiency Continue supplementation  Vitamin D deficiency Continue supplementation    Nonallopathic problems  Decision today to treat with OMT was based on Physical Exam  After verbal consent patient was treated with HVLA, ME, FPR techniques in , thoracic, lumbar, and sacral  areas  Patient tolerated the procedure well with improvement in symptoms  Patient given exercises, stretches and lifestyle modifications  See medications in patient instructions if given  Patient will follow up in 4-8 weeks     The above documentation has been reviewed and is accurate and complete Judi Saa, DO         Note: This dictation was prepared with Dragon dictation along with smaller phrase technology. Any transcriptional errors that result from this process are unintentional.

## 2023-05-19 ENCOUNTER — Ambulatory Visit (INDEPENDENT_AMBULATORY_CARE_PROVIDER_SITE_OTHER): Payer: BC Managed Care – PPO | Admitting: Family Medicine

## 2023-05-19 ENCOUNTER — Encounter: Payer: Self-pay | Admitting: Family Medicine

## 2023-05-19 VITALS — BP 132/90 | HR 73 | Ht 76.0 in | Wt 257.0 lb

## 2023-05-19 DIAGNOSIS — M9903 Segmental and somatic dysfunction of lumbar region: Secondary | ICD-10-CM | POA: Diagnosis not present

## 2023-05-19 DIAGNOSIS — M9902 Segmental and somatic dysfunction of thoracic region: Secondary | ICD-10-CM

## 2023-05-19 DIAGNOSIS — E538 Deficiency of other specified B group vitamins: Secondary | ICD-10-CM | POA: Diagnosis not present

## 2023-05-19 DIAGNOSIS — M9904 Segmental and somatic dysfunction of sacral region: Secondary | ICD-10-CM | POA: Diagnosis not present

## 2023-05-19 DIAGNOSIS — M533 Sacrococcygeal disorders, not elsewhere classified: Secondary | ICD-10-CM

## 2023-05-19 DIAGNOSIS — E559 Vitamin D deficiency, unspecified: Secondary | ICD-10-CM

## 2023-05-19 NOTE — Patient Instructions (Signed)
 Enjoy [place] Enjoy massage See you again in 7-8 weeks

## 2023-05-19 NOTE — Assessment & Plan Note (Signed)
 Continue supplementation  ?

## 2023-05-19 NOTE — Assessment & Plan Note (Signed)
 Continues to have difficulty of the sacroiliac joint as well as ongoing facet arthritic changes.  Discussed icing exercises, follow-up in 6 to 8 weeks responds well to osteopathic manipulation.  No medication changes

## 2023-07-06 NOTE — Progress Notes (Unsigned)
 [facility] [address] [place] [address] Phone: [phone] Subjective:   Johnny [name], am serving as a scribe for Dr. Ronnell Coins.  I'm seeing this patient by the request  of:  Rodney Clamp, MD  CC: back and neck pain follow up   [name]  Johnny [name] is a 43 y.o. male coming in with complaint of back and neck pain. OMT 05/19/2023 Patient states no new symptoms. Same pains as usual.  Medications patient has been prescribed: Gabapentin , Zanaflex   Taking:         Reviewed prior external information including notes and imaging from previsou exam, outside providers and external EMR if available.   As well as notes that were available from care everywhere and other healthcare systems.  Past medical history, social, surgical and family history all reviewed in electronic medical record.  No pertanent information unless stated regarding to the chief complaint.   Past Medical History:  Diagnosis Date   Crohn's disease (HCC)    GERD (gastroesophageal reflux disease)    Hyperlipidemia    Hypertension     Allergies  Allergen Reactions   Other Diarrhea and Nausea And Vomiting    Champagne    Codeine Nausea Only     Review of Systems:  No headache, visual changes, nausea, vomiting, diarrhea, constipation, dizziness, abdominal pain, skin rash, fevers, chills, night sweats, weight loss, swollen lymph nodes, body aches, joint swelling, chest pain, shortness of breath, mood changes. POSITIVE muscle aches  Objective  Blood pressure 116/82, pulse 80, height 6\' 4"  (1.93 m), weight 261 lb (118.4 kg), SpO2 98%.   General: No apparent distress alert and oriented x3 mood and affect normal, dressed appropriately.  HEENT: Pupils equal, extraocular movements intact  Respiratory: Patient's speak in full sentences and does not appear short of breath  Cardiovascular: No lower extremity edema, non tender, no erythema  Gait normal  MSK:   Back loss of lordosis noted, positive FABER negative neck exam does have some loss lordosis noted.  Some tenderness to palpation in the paraspinal musculature.  Osteopathic findings  C2 flexed rotated and side bent right C3 flexed rotated and side bent right C7 flexed rotated and side bent left T3 extended rotated and side bent right inhaled rib T6 extended rotated and side bent left L2 flexed rotated and side bent right L3 flexed rotated and side bent left Sacrum right on right     Assessment and Plan:  Low back pain Continue to have chronic problems.  Discussed with patient again about icing regimen and home exercises.  We discussed the possibility of journavax as a possible treatment option.  Discussed with patient about icing regimen and home exercises otherwise.  Increase activity slowly.  Follow-up again in 6 to 8 weeks.    Nonallopathic problems  Decision today to treat with OMT was based on Physical Exam  After verbal consent patient was treated with HVLA, ME, FPR techniques in cervical, rib, thoracic, lumbar, and sacral  areas  Patient tolerated the procedure well with improvement in symptoms  Patient given exercises, stretches and lifestyle modifications  See medications in patient instructions if given  Patient will follow up in 4-8 weeks    The above documentation has been reviewed and is accurate and complete Johnny Mendibles M Zenovia Justman, DO          Note: This dictation was prepared with Dragon dictation along with smaller phrase technology. Any transcriptional errors that result from this  process are unintentional.

## 2023-07-07 ENCOUNTER — Encounter: Payer: Self-pay | Admitting: Family Medicine

## 2023-07-07 ENCOUNTER — Ambulatory Visit: Admitting: Family Medicine

## 2023-07-07 VITALS — BP 116/82 | HR 80 | Ht 76.0 in | Wt 261.0 lb

## 2023-07-07 DIAGNOSIS — M9901 Segmental and somatic dysfunction of cervical region: Secondary | ICD-10-CM

## 2023-07-07 DIAGNOSIS — M545 Low back pain, unspecified: Secondary | ICD-10-CM | POA: Diagnosis not present

## 2023-07-07 DIAGNOSIS — M9904 Segmental and somatic dysfunction of sacral region: Secondary | ICD-10-CM | POA: Diagnosis not present

## 2023-07-07 DIAGNOSIS — G8929 Other chronic pain: Secondary | ICD-10-CM

## 2023-07-07 DIAGNOSIS — M9903 Segmental and somatic dysfunction of lumbar region: Secondary | ICD-10-CM | POA: Diagnosis not present

## 2023-07-07 DIAGNOSIS — M9908 Segmental and somatic dysfunction of rib cage: Secondary | ICD-10-CM | POA: Diagnosis not present

## 2023-07-07 DIAGNOSIS — M9902 Segmental and somatic dysfunction of thoracic region: Secondary | ICD-10-CM | POA: Diagnosis not present

## 2023-07-07 NOTE — Assessment & Plan Note (Signed)
 Continue to have chronic problems.  Discussed with patient again about icing regimen and home exercises.  We discussed the possibility of journavax as a possible treatment option.  Discussed with patient about icing regimen and home exercises otherwise.  Increase activity slowly.  Follow-up again in 6 to 8 weeks.

## 2023-07-07 NOTE — Patient Instructions (Signed)
 Thanks for travel recommendation Overall not too shabby See you again in 6-8 weeks

## 2023-08-03 ENCOUNTER — Encounter: Payer: Self-pay | Admitting: Family Medicine

## 2023-08-03 ENCOUNTER — Other Ambulatory Visit: Payer: Self-pay

## 2023-08-03 MED ORDER — PREDNISONE 20 MG PO TABS: 40.0000 mg | ORAL_TABLET | Freq: Every day | ORAL | 0 refills | Status: DC

## 2023-08-22 ENCOUNTER — Other Ambulatory Visit: Payer: Self-pay | Admitting: Family Medicine

## 2023-08-30 DIAGNOSIS — E559 Vitamin D deficiency, unspecified: Secondary | ICD-10-CM | POA: Diagnosis not present

## 2023-08-30 DIAGNOSIS — R7989 Other specified abnormal findings of blood chemistry: Secondary | ICD-10-CM | POA: Diagnosis not present

## 2023-08-30 DIAGNOSIS — R030 Elevated blood-pressure reading, without diagnosis of hypertension: Secondary | ICD-10-CM | POA: Diagnosis not present

## 2023-08-30 DIAGNOSIS — E063 Autoimmune thyroiditis: Secondary | ICD-10-CM | POA: Diagnosis not present

## 2023-08-30 DIAGNOSIS — R946 Abnormal results of thyroid function studies: Secondary | ICD-10-CM | POA: Diagnosis not present

## 2023-08-31 NOTE — Progress Notes (Unsigned)
  [name] [address] [place] [address] Phone: [phone] Subjective:   ISusannah [name], am serving as a scribe for Dr. Arthea Claudene.  I'm seeing this patient by the request  of:  Kennyth Worth HERO, MD  CC: right back pain   YEP:Johnny [name]  MOHD. DERFLINGER is a 43 y.o. male coming in with complaint of back and neck pain. OMT on 07/07/2023. Patient states same per usual. No new symptoms.  Medications patient has been prescribed: Vit D Prednisone   Taking:      Reviewed prior external information including notes and imaging from previsou exam, outside providers and external EMR if available.   As well as notes that were available from care everywhere and other healthcare systems.  Past medical history, social, surgical and family history all reviewed in electronic medical record.  No pertanent information unless stated regarding to the chief complaint.   Past Medical History:  Diagnosis Date   Crohn's disease (HCC)    GERD (gastroesophageal reflux disease)    Hyperlipidemia    Hypertension     Allergies  Allergen Reactions   Other Diarrhea and Nausea And Vomiting    Champagne    Codeine Nausea Only     Review of Systems:  No headache, visual changes, nausea, vomiting, diarrhea, constipation, dizziness, abdominal pain, skin rash, fevers, chills, night sweats, weight loss, swollen lymph nodes, body aches, joint swelling, chest pain, shortness of breath, mood changes. POSITIVE muscle aches  Objective  Blood pressure (!) 118/90, pulse 80, height 6' 4 (1.93 m), weight 263 lb (119.3 kg), SpO2 97%.   General: No apparent distress alert and oriented x3 mood and affect normal, dressed appropriately.  HEENT: Pupils equal, extraocular movements intact  Respiratory: Patient's speak in full sentences and does not appear short of breath  Cardiovascular: No lower extremity edema, non tender, no erythema  Gait MSK:  Back mild loss of  lordosis.  Tightness more in the thoracolumbar junction which is higher than usually where patient has discomfort and pain.  Osteopathic findings  C3 flexed rotated and side bent right C6 flexed rotated and side bent left T3 extended rotated and side bent right inhaled rib T9 extended rotated and side bent left L1 flexed rotated and side bent right Sacrum right on right  We talking to   Assessment and Plan:  Sacroiliac pain Tightness noted but nothing severe at the time.  Will refill patient's prednisone  to have on hand.  Discussed which activities to do and which ones to avoid.  Increase activity slowly.  Follow-up again in 6 to 8 weeks.    Nonallopathic problems  Decision today to treat with OMT was based on Physical Exam  After verbal consent patient was treated with HVLA, ME, FPR techniques in cervical, rib, thoracic, lumbar, and sacral  areas  Patient tolerated the procedure well with improvement in symptoms  Patient given exercises, stretches and lifestyle modifications  See medications in patient instructions if given  Patient will follow up in 4-8 weeks    The above documentation has been reviewed and is accurate and complete Emersen Mascari M Ruba Outen, DO          Note: This dictation was prepared with Dragon dictation along with smaller phrase technology. Any transcriptional errors that result from this process are unintentional.

## 2023-09-01 ENCOUNTER — Ambulatory Visit: Admitting: Family Medicine

## 2023-09-01 ENCOUNTER — Encounter: Payer: Self-pay | Admitting: Family Medicine

## 2023-09-01 VITALS — BP 118/90 | HR 80 | Ht 76.0 in | Wt 263.0 lb

## 2023-09-01 DIAGNOSIS — M9903 Segmental and somatic dysfunction of lumbar region: Secondary | ICD-10-CM | POA: Diagnosis not present

## 2023-09-01 DIAGNOSIS — M9908 Segmental and somatic dysfunction of rib cage: Secondary | ICD-10-CM

## 2023-09-01 DIAGNOSIS — M533 Sacrococcygeal disorders, not elsewhere classified: Secondary | ICD-10-CM

## 2023-09-01 DIAGNOSIS — M9904 Segmental and somatic dysfunction of sacral region: Secondary | ICD-10-CM

## 2023-09-01 DIAGNOSIS — M9902 Segmental and somatic dysfunction of thoracic region: Secondary | ICD-10-CM | POA: Diagnosis not present

## 2023-09-01 DIAGNOSIS — M9901 Segmental and somatic dysfunction of cervical region: Secondary | ICD-10-CM

## 2023-09-01 MED ORDER — PREDNISONE 20 MG PO TABS: 40.0000 mg | ORAL_TABLET | Freq: Every day | ORAL | 0 refills | Status: DC

## 2023-09-01 NOTE — Patient Instructions (Signed)
 Good to see you! Great movement Refilled prednisone  See you again after universal

## 2023-09-01 NOTE — Assessment & Plan Note (Signed)
 Tightness noted but nothing severe at the time.  Will refill patient's prednisone  to have on hand.  Discussed which activities to do and which ones to avoid.  Increase activity slowly.  Follow-up again in 6 to 8 weeks.

## 2023-09-29 ENCOUNTER — Other Ambulatory Visit: Payer: Self-pay | Admitting: Family Medicine

## 2023-10-12 DIAGNOSIS — E042 Nontoxic multinodular goiter: Secondary | ICD-10-CM | POA: Diagnosis not present

## 2023-10-12 DIAGNOSIS — E041 Nontoxic single thyroid nodule: Secondary | ICD-10-CM | POA: Diagnosis not present

## 2023-10-26 NOTE — Progress Notes (Unsigned)
 [facility] [address] [place] [address] Phone: [phone] Subjective:   Johnny [name] Johnny [name], am serving as a scribe for Dr. Arthea Claudene.  I'm seeing this patient by the request  of:  Kennyth Worth HERO, MD  CC: Back and neck pain follow-up  YEP:Dlagzrupcz  Johnny [name] is a 43 y.o. male coming in with complaint of back and neck pain. OMT on 09/01/2023. Patient states that he is the same as last visit. Patient states he did not unfortunately do prednisone  when he went to [employer].  He had to do a lot of walking around.  He feels like this may have caused some exacerbation.  Had difficulty coming off of the steroids and wants to know if we can do a taper if ever needed again.  Medications patient has been prescribed: gabapentin  prednisone   Taking:         Reviewed prior external information including notes and imaging from previsou exam, outside providers and external EMR if available.   As well as notes that were available from care everywhere and other healthcare systems.  Past medical history, social, surgical and family history all reviewed in electronic medical record.  No pertanent information unless stated regarding to the chief complaint.   Past Medical History:  Diagnosis Date   Crohn's disease (HCC)    GERD (gastroesophageal reflux disease)    Hyperlipidemia    Hypertension     Allergies  Allergen Reactions   Other Diarrhea and Nausea And Vomiting    Champagne    Codeine Nausea Only     Review of Systems:  No headache, visual changes, nausea, vomiting, diarrhea, constipation, dizziness, abdominal pain, skin rash, fevers, chills, night sweats, weight loss, swollen lymph nodes, body aches, joint swelling, chest pain, shortness of breath, mood changes. POSITIVE muscle aches  Objective  Blood pressure 110/86, pulse 92, height 6' 4 (1.93 m), weight 266 lb (120.7 kg), SpO2 96%.   General: No apparent distress alert  and oriented x3 mood and affect normal, dressed appropriately.  HEENT: Pupils equal, extraocular movements intact  Respiratory: Patient's speak in full sentences and does not appear short of breath  Cardiovascular: No lower extremity edema, non tender, no erythema  Gait MSK:  Back does have some loss lordosis noted.  Patient's core strength still has room for improvement.  Tightness noted with Deri.  Neck exam does have some difficulty with sidebending bilaterally.  Osteopathic findings  C7 flexed rotated and side bent left T5 extended rotated and side bent right inhaled rib T8 extended rotated and side bent left L2 flexed rotated and side bent right Sacrum right on right       Assessment and Plan:  Low back pain Low back does have some loss of lordosis.  Some tenderness to palpation of musculature.  Muscle tightness noted of the right greater than left.  Tightness with Deri right greater than left.  Negative straight leg test noted.  Will continue to monitor.  No changes in medications.  Follow-up again in 6 to 8 weeks    Nonallopathic problems  Decision today to treat with OMT was based on Physical Exam  After verbal consent patient was treated with HVLA, ME, FPR techniques in cervical, rib, thoracic, lumbar, and sacral  areas  Patient tolerated the procedure well with improvement in symptoms  Patient given exercises, stretches and lifestyle modifications  See medications in patient instructions if given  Patient will follow up in 4-8 weeks  The above documentation has been reviewed and is accurate and complete Curtistine Pettitt M Taleigha Pinson, DO         Note: This dictation was prepared with Dragon dictation along with smaller phrase technology. Any transcriptional errors that result from this process are unintentional.

## 2023-10-27 ENCOUNTER — Ambulatory Visit: Admitting: Family Medicine

## 2023-10-27 ENCOUNTER — Encounter: Payer: Self-pay | Admitting: Family Medicine

## 2023-10-27 VITALS — BP 110/86 | HR 92 | Ht 76.0 in | Wt 266.0 lb

## 2023-10-27 DIAGNOSIS — M9908 Segmental and somatic dysfunction of rib cage: Secondary | ICD-10-CM | POA: Diagnosis not present

## 2023-10-27 DIAGNOSIS — M9903 Segmental and somatic dysfunction of lumbar region: Secondary | ICD-10-CM | POA: Diagnosis not present

## 2023-10-27 DIAGNOSIS — M9904 Segmental and somatic dysfunction of sacral region: Secondary | ICD-10-CM

## 2023-10-27 DIAGNOSIS — M9901 Segmental and somatic dysfunction of cervical region: Secondary | ICD-10-CM | POA: Diagnosis not present

## 2023-10-27 DIAGNOSIS — G8929 Other chronic pain: Secondary | ICD-10-CM | POA: Diagnosis not present

## 2023-10-27 DIAGNOSIS — M545 Low back pain, unspecified: Secondary | ICD-10-CM | POA: Diagnosis not present

## 2023-10-27 DIAGNOSIS — M9902 Segmental and somatic dysfunction of thoracic region: Secondary | ICD-10-CM

## 2023-10-27 NOTE — Assessment & Plan Note (Signed)
 Low back does have some loss of lordosis.  Some tenderness to palpation of musculature.  Muscle tightness noted of the right greater than left.  Tightness with Deri right greater than left.  Negative straight leg test noted.  Will continue to monitor.  No changes in medications.  Follow-up again in 6 to 8 weeks

## 2023-10-27 NOTE — Patient Instructions (Signed)
 Great to see you Glad you survived mario cart See me in 2 months

## 2023-10-28 ENCOUNTER — Other Ambulatory Visit: Payer: Self-pay | Admitting: Family Medicine

## 2023-11-20 ENCOUNTER — Other Ambulatory Visit: Payer: Self-pay | Admitting: Family Medicine

## 2023-12-20 NOTE — Progress Notes (Unsigned)
 [facility] [address] [place] [address] Phone: [phone] Subjective:   Johnny [name], am serving as a scribe for Dr. Arthea Claudene.  I'm seeing this patient by the request  of:  Kennyth Worth HERO, MD  CC: Back and neck pain follow-up  YEP:Dlagzrupcz  Johnny [name] is a 43 y.o. male coming in with complaint of back and neck pain. OMT 10/27/2023. Patient states that he has been the same.  Continues to have some discomfort and pain,  Medications patient has been prescribed: Gabapentin , Zanaflex , Vit D  Taking:         Reviewed prior external information including notes and imaging from previsou exam, outside providers and external EMR if available.   As well as notes that were available from care everywhere and other healthcare systems.  Past medical history, social, surgical and family history all reviewed in electronic medical record.  No pertanent information unless stated regarding to the chief complaint.   Past Medical History:  Diagnosis Date   Crohn's disease (HCC)    GERD (gastroesophageal reflux disease)    Hyperlipidemia    Hypertension     Allergies  Allergen Reactions   Other Diarrhea and Nausea And Vomiting    Champagne    Codeine Nausea Only     Review of Systems:  No headache, visual changes, nausea, vomiting, diarrhea, constipation, dizziness, abdominal pain, skin rash, fevers, chills, night sweats, weight loss, swollen lymph nodes, body aches, joint swelling, chest pain, shortness of breath, mood changes. POSITIVE muscle aches  Objective  Blood pressure 116/86, pulse 70, height 6' 4 (1.93 m), weight 266 lb (120.7 kg), SpO2 97%.   General: No apparent distress alert and oriented x3 mood and affect normal, dressed appropriately.  HEENT: Pupils equal, extraocular movements intact  Respiratory: Patient's speak in full sentences and does not appear short of breath  Cardiovascular: No lower extremity edema, non  tender, no erythema  Gait MSK:  Back does have some loss lordosis noted.  Patient does have some tenderness to palpation in the thoracolumbar and the lumbosacral areas.  Seems to be more on the right at the sacroiliac and the left at the thoracolumbar.  Tightness with FABER test bilaterally  Osteopathic findings  C2 flexed rotated and side bent right C7 flexed rotated and side bent right T6 extended rotated and side bent right inhaled rib T11 extended rotated and side bent left L1 flexed rotated and side bent left Sacrum right on right       Assessment and Plan:  Low back pain Continue lower back pain.  Discussed icing regimen and home exercise, discussed which activities to do and which ones to avoid.  Discussed with patient about different medications but no significant medication changes today.  Discussed icing regimen and home exercises, increase activity slowly.  Follow-up in 6 to 8 weeks    Nonallopathic problems  Decision today to treat with OMT was based on Physical Exam  After verbal consent patient was treated with HVLA, ME, FPR techniques in cervical, rib, thoracic, lumbar, and sacral  areas  Patient tolerated the procedure well with improvement in symptoms  Patient given exercises, stretches and lifestyle modifications  See medications in patient instructions if given  Patient will follow up in 4-8 weeks     The above documentation has been reviewed and is accurate and complete Johnny Stanco [name], DO         Note: This dictation was prepared with [place]  dictation along with smaller phrase technology. Any transcriptional errors that result from this process are unintentional.

## 2023-12-22 ENCOUNTER — Encounter: Payer: Self-pay | Admitting: Family Medicine

## 2023-12-22 ENCOUNTER — Ambulatory Visit: Admitting: Family Medicine

## 2023-12-22 VITALS — BP 116/86 | HR 70 | Ht 76.0 in | Wt 266.0 lb

## 2023-12-22 DIAGNOSIS — G8929 Other chronic pain: Secondary | ICD-10-CM

## 2023-12-22 DIAGNOSIS — M9903 Segmental and somatic dysfunction of lumbar region: Secondary | ICD-10-CM

## 2023-12-22 DIAGNOSIS — M545 Low back pain, unspecified: Secondary | ICD-10-CM | POA: Diagnosis not present

## 2023-12-22 DIAGNOSIS — M9908 Segmental and somatic dysfunction of rib cage: Secondary | ICD-10-CM | POA: Diagnosis not present

## 2023-12-22 DIAGNOSIS — M9904 Segmental and somatic dysfunction of sacral region: Secondary | ICD-10-CM | POA: Diagnosis not present

## 2023-12-22 DIAGNOSIS — M9901 Segmental and somatic dysfunction of cervical region: Secondary | ICD-10-CM

## 2023-12-22 DIAGNOSIS — M9902 Segmental and somatic dysfunction of thoracic region: Secondary | ICD-10-CM

## 2023-12-22 NOTE — Assessment & Plan Note (Signed)
 Continue lower back pain.  Discussed icing regimen and home exercise, discussed which activities to do and which ones to avoid.  Discussed with patient about different medications but no significant medication changes today.  Discussed icing regimen and home exercises, increase activity slowly.  Follow-up in 6 to 8 weeks

## 2023-12-22 NOTE — Patient Instructions (Signed)
 Good to see you Tell wife to do planting See me in 6-8 weeks

## 2023-12-23 DIAGNOSIS — R110 Nausea: Secondary | ICD-10-CM | POA: Diagnosis not present

## 2023-12-23 DIAGNOSIS — K219 Gastro-esophageal reflux disease without esophagitis: Secondary | ICD-10-CM | POA: Diagnosis not present

## 2023-12-23 DIAGNOSIS — K50919 Crohn's disease, unspecified, with unspecified complications: Secondary | ICD-10-CM | POA: Diagnosis not present

## 2023-12-23 DIAGNOSIS — R634 Abnormal weight loss: Secondary | ICD-10-CM | POA: Diagnosis not present

## 2024-01-10 ENCOUNTER — Other Ambulatory Visit: Payer: Self-pay | Admitting: Family Medicine

## 2024-02-12 ENCOUNTER — Other Ambulatory Visit: Payer: Self-pay | Admitting: Family Medicine

## 2024-02-24 ENCOUNTER — Other Ambulatory Visit: Payer: Self-pay | Admitting: Family Medicine

## 2024-02-29 NOTE — Progress Notes (Signed)
 " [name] [address] [place] [address] Phone: [phone] Subjective:   Johnny [name] Johnny [name], am serving as a scribe for Dr. Arthea Johnny [name].  I'[name] seeing this patient by the request  of:  Johnny Worth HERO, MD  CC: Back and neck pain follow-up  YEP:Dlagzrupcz  Johnny [name] is a 44 y.o. male coming in with complaint of back and neck pain. OMT 12/22/2023. Patient states that his L knee has been painful over medial aspect for past month. Squatting increases his pain and he is waking up in pain at night. Back is the same.   Medications patient has been prescribed: Hydroxizine, Zanaflex , Vit D  Taking:         Reviewed prior external information including notes and imaging from previsou exam, outside providers and external EMR if available.   As well as notes that were available from care everywhere and other healthcare systems.  Past medical history, social, surgical and family history all reviewed in electronic medical record.  No pertanent information unless stated regarding to the chief complaint.   Past Medical History:  Diagnosis Date   Crohn's disease (HCC)    GERD (gastroesophageal reflux disease)    Hyperlipidemia    Hypertension     Allergies[1]   Review of Systems:  No headache, visual changes, nausea, vomiting, diarrhea, constipation, dizziness, abdominal pain, skin rash, fevers, chills, night sweats, weight loss, swollen lymph nodes, body aches, joint swelling, chest pain, shortness of breath, mood changes. POSITIVE muscle aches  Objective  Blood pressure 118/88, height 6' 4 (1.93 [name]), weight 266 lb (120.7 kg).   General: No apparent distress alert and oriented x3 mood and affect normal, dressed appropriately.  HEENT: Pupils equal, extraocular movements intact  Respiratory: Patient's speak in full sentences and does not appear short of breath  Cardiovascular: No lower extremity edema, non tender, no erythema  Gait MSK:   Back does have some loss lordosis noted.  Some tenderness to palpation in the paraspinal musculature.  Some tightness noted in the paraspinal musculature seems to be more on the neck today actually than usual.  Osteopathic findings  C2 flexed rotated and side bent right C6 flexed rotated and side bent right T3 extended rotated and side bent right inhaled rib T9 extended rotated and side bent left L2 flexed rotated and side bent right Sacrum right on right       Assessment and Plan:  Sacroiliac pain Continue to have some discomfort and pain noted.  Discussed icing regimen and home exercises, discussed which activities to do and which ones to avoid.  Increase activity slowly.  Discussed icing regimen.  Follow-up again in 6 to 12 weeks.  Left knee pain Left knee exam does have what appears to be more of a plica.  Discussed different treatment options.  Do not think that there is any underlying arthritis but we will get x-rays to further evaluate.  Follow-up again in 6 to 12 weeks.    Nonallopathic problems  Decision today to treat with OMT was based on Physical Exam  After verbal consent patient was treated with HVLA, ME, FPR techniques in cervical, rib, thoracic, lumbar, and sacral  areas  Patient tolerated the procedure well with improvement in symptoms  Patient given exercises, stretches and lifestyle modifications  See medications in patient instructions if given  Patient will follow up in 4-8 weeks    The above documentation has been reviewed and is accurate and complete Johnny [name]  Claudene, DO          Note: This dictation was prepared with Dragon dictation along with smaller phrase technology. Any transcriptional errors that result from this process are unintentional.            [1]  Allergies Allergen Reactions   Other Diarrhea and Nausea And Vomiting    Champagne    Codeine Nausea Only   "

## 2024-03-01 ENCOUNTER — Ambulatory Visit: Admitting: Family Medicine

## 2024-03-01 ENCOUNTER — Ambulatory Visit

## 2024-03-01 VITALS — BP 118/88 | Ht 76.0 in | Wt 266.0 lb

## 2024-03-01 DIAGNOSIS — M9902 Segmental and somatic dysfunction of thoracic region: Secondary | ICD-10-CM | POA: Diagnosis not present

## 2024-03-01 DIAGNOSIS — M533 Sacrococcygeal disorders, not elsewhere classified: Secondary | ICD-10-CM | POA: Diagnosis not present

## 2024-03-01 DIAGNOSIS — M9903 Segmental and somatic dysfunction of lumbar region: Secondary | ICD-10-CM | POA: Diagnosis not present

## 2024-03-01 DIAGNOSIS — M9901 Segmental and somatic dysfunction of cervical region: Secondary | ICD-10-CM

## 2024-03-01 DIAGNOSIS — M9908 Segmental and somatic dysfunction of rib cage: Secondary | ICD-10-CM | POA: Diagnosis not present

## 2024-03-01 DIAGNOSIS — M9904 Segmental and somatic dysfunction of sacral region: Secondary | ICD-10-CM | POA: Diagnosis not present

## 2024-03-01 DIAGNOSIS — M25562 Pain in left knee: Secondary | ICD-10-CM | POA: Diagnosis not present

## 2024-03-01 NOTE — Patient Instructions (Addendum)
 Xray today Exercises Arnica topically See me again in 6-8 weeks

## 2024-03-01 NOTE — Assessment & Plan Note (Signed)
 Left knee exam does have what appears to be more of a plica.  Discussed different treatment options.  Do not think that there is any underlying arthritis but we will get x-rays to further evaluate.  Follow-up again in 6 to 12 weeks.

## 2024-03-01 NOTE — Assessment & Plan Note (Signed)
 Continue to have some discomfort and pain noted.  Discussed icing regimen and home exercises, discussed which activities to do and which ones to avoid.  Increase activity slowly.  Discussed icing regimen.  Follow-up again in 6 to 12 weeks.

## 2024-03-08 ENCOUNTER — Encounter: Payer: Self-pay | Admitting: Family Medicine

## 2024-03-08 ENCOUNTER — Ambulatory Visit: Admitting: Family Medicine

## 2024-03-08 VITALS — BP 120/88 | HR 68 | Temp 98.0°F | Ht 76.0 in | Wt 268.2 lb

## 2024-03-08 DIAGNOSIS — E559 Vitamin D deficiency, unspecified: Secondary | ICD-10-CM | POA: Diagnosis not present

## 2024-03-08 DIAGNOSIS — E785 Hyperlipidemia, unspecified: Secondary | ICD-10-CM

## 2024-03-08 DIAGNOSIS — R7303 Prediabetes: Secondary | ICD-10-CM

## 2024-03-08 DIAGNOSIS — E063 Autoimmune thyroiditis: Secondary | ICD-10-CM | POA: Diagnosis not present

## 2024-03-08 DIAGNOSIS — K5090 Crohn's disease, unspecified, without complications: Secondary | ICD-10-CM | POA: Diagnosis not present

## 2024-03-08 DIAGNOSIS — Z0001 Encounter for general adult medical examination with abnormal findings: Secondary | ICD-10-CM | POA: Diagnosis not present

## 2024-03-08 DIAGNOSIS — E538 Deficiency of other specified B group vitamins: Secondary | ICD-10-CM | POA: Diagnosis not present

## 2024-03-08 DIAGNOSIS — I10 Essential (primary) hypertension: Secondary | ICD-10-CM

## 2024-03-08 DIAGNOSIS — H9190 Unspecified hearing loss, unspecified ear: Secondary | ICD-10-CM | POA: Diagnosis not present

## 2024-03-08 DIAGNOSIS — F419 Anxiety disorder, unspecified: Secondary | ICD-10-CM

## 2024-03-08 DIAGNOSIS — L309 Dermatitis, unspecified: Secondary | ICD-10-CM | POA: Diagnosis not present

## 2024-03-08 LAB — LIPID PANEL
Cholesterol: 253 mg/dL — ABNORMAL HIGH (ref 28–200)
HDL: 37.4 mg/dL — ABNORMAL LOW
NonHDL: 216.03
Total CHOL/HDL Ratio: 7
Triglycerides: 477 mg/dL — ABNORMAL HIGH (ref 10.0–149.0)
VLDL: 95.4 mg/dL — ABNORMAL HIGH (ref 0.0–40.0)

## 2024-03-08 LAB — COMPREHENSIVE METABOLIC PANEL WITH GFR
ALT: 52 U/L (ref 3–53)
AST: 35 U/L (ref 5–37)
Albumin: 4.3 g/dL (ref 3.5–5.2)
Alkaline Phosphatase: 65 U/L (ref 39–117)
BUN: 14 mg/dL (ref 6–23)
CO2: 28 meq/L (ref 19–32)
Calcium: 9.1 mg/dL (ref 8.4–10.5)
Chloride: 103 meq/L (ref 96–112)
Creatinine, Ser: 0.81 mg/dL (ref 0.40–1.50)
GFR: 107.14 mL/min
Glucose, Bld: 104 mg/dL — ABNORMAL HIGH (ref 70–99)
Potassium: 3.8 meq/L (ref 3.5–5.1)
Sodium: 137 meq/L (ref 135–145)
Total Bilirubin: 0.3 mg/dL (ref 0.2–1.2)
Total Protein: 7.6 g/dL (ref 6.0–8.3)

## 2024-03-08 LAB — CBC WITH DIFFERENTIAL/PLATELET
Basophils Absolute: 0.1 K/uL (ref 0.0–0.1)
Basophils Relative: 1.5 % (ref 0.0–3.0)
Eosinophils Absolute: 0.2 K/uL (ref 0.0–0.7)
Eosinophils Relative: 4.4 % (ref 0.0–5.0)
HCT: 44.5 % (ref 39.0–52.0)
Hemoglobin: 14.2 g/dL (ref 13.0–17.0)
Lymphocytes Relative: 39.5 % (ref 12.0–46.0)
Lymphs Abs: 2.1 K/uL (ref 0.7–4.0)
MCHC: 32 g/dL (ref 30.0–36.0)
MCV: 71.2 fl — ABNORMAL LOW (ref 78.0–100.0)
Monocytes Absolute: 0.5 K/uL (ref 0.1–1.0)
Monocytes Relative: 9.8 % (ref 3.0–12.0)
Neutro Abs: 2.4 K/uL (ref 1.4–7.7)
Neutrophils Relative %: 44.8 % (ref 43.0–77.0)
Platelets: 228 K/uL (ref 150.0–400.0)
RBC: 6.25 Mil/uL — ABNORMAL HIGH (ref 4.22–5.81)
RDW: 14.1 % (ref 11.5–15.5)
WBC: 5.3 K/uL (ref 4.0–10.5)

## 2024-03-08 LAB — VITAMIN B12: Vitamin B-12: 231 pg/mL (ref 211–911)

## 2024-03-08 LAB — TSH: TSH: 2.02 u[IU]/mL (ref 0.35–5.50)

## 2024-03-08 LAB — LDL CHOLESTEROL, DIRECT: Direct LDL: 167 mg/dL

## 2024-03-08 LAB — VITAMIN D 25 HYDROXY (VIT D DEFICIENCY, FRACTURES): VITD: 24.91 ng/mL — ABNORMAL LOW (ref 30.00–100.00)

## 2024-03-08 LAB — HEMOGLOBIN A1C: Hgb A1c MFr Bld: 7.1 % — ABNORMAL HIGH (ref 4.6–6.5)

## 2024-03-08 MED ORDER — TRIAMCINOLONE ACETONIDE 0.5 % EX CREA: TOPICAL_CREAM | Freq: Three times a day (TID) | CUTANEOUS | 5 refills | Status: AC

## 2024-03-08 MED ORDER — CITALOPRAM HYDROBROMIDE 10 MG PO TABS: 10.0000 mg | ORAL_TABLET | Freq: Every day | ORAL | 3 refills | Status: AC

## 2024-03-08 MED ORDER — AMLODIPINE BESYLATE 5 MG PO TABS: 5.0000 mg | ORAL_TABLET | Freq: Every day | ORAL | 3 refills | Status: AC

## 2024-03-08 NOTE — Assessment & Plan Note (Signed)
 Check lipids with labs.  We discussed lifestyle modifications.

## 2024-03-08 NOTE — Assessment & Plan Note (Signed)
Stable on triamcinolone as needed.  Will refill today.

## 2024-03-08 NOTE — Progress Notes (Signed)
 "  Chief Complaint:  Johnny [name] is a 44 y.o. male who presents today for his annual comprehensive physical exam.    Assessment/Plan:  New/Acute Problems: Hearing loss Normal exam.  Has a history of tinnitus as well.  Unclear etiology for his hearing loss.  Will place referral to ENT for further evaluation and management.  Chronic Problems Addressed Today: Benign essential hypertension At goal today on amlodipine  5 mg daily.  Will refill today.  Dyslipidemia Check lipids with labs.  We discussed lifestyle modifications.  Vitamin B12 deficiency Check B12 with labs.  Prediabetes Check A1c with labs.  Vitamin D  deficiency Check vitamin D  with labs.  Anxiety Stable on celexa  20 mg daily.  Will refill today.  Hashimoto thyroiditis Follows with endocrinology.  Most recent labs were at goal  Crohn's disease [facility]) Follows with gastroenterology.  Most recent colonoscopy did not show any signs of Crohn disease in his colon.  Eczema Stable on triamcinolone  as needed.  Will refill today.   Preventative Healthcare: Check labs.  Up-to-date on vaccines  Patient Counseling(The following topics were reviewed and/or handout was given):  -Nutrition: Stressed importance of moderation in sodium/caffeine intake, saturated fat and cholesterol, caloric balance, sufficient intake of fresh fruits, vegetables, and fiber.  -Stressed the importance of regular exercise.   -Substance Abuse: Discussed cessation/primary prevention of tobacco, alcohol, or other drug use; driving or other dangerous activities under the influence; availability of treatment for abuse.   -Injury prevention: Discussed safety belts, safety helmets, smoke detector, smoking near bedding or upholstery.   -Sexuality: Discussed sexually transmitted diseases, partner selection, use of condoms, avoidance of unintended pregnancy and contraceptive alternatives.   -Dental health: Discussed importance of regular tooth brushing,  flossing, and dental visits.  -Health maintenance and immunizations reviewed. Please refer to Health maintenance section.  Return to care in 1 year for next preventative visit.     Subjective:  HPI:  He has no acute complaints today. Patient is here today for his annual physical.  See assessment / plan for status of chronic conditions.  Discussed the use of AI scribe software for clinical note transcription with the patient, who gave verbal consent to proceed.  History of Present Illness Johnny [name] is a 44 year old male who presents for medication refills and blood work.  He has experienced a decrease in hearing, with difficulty understanding people and a constant need to 'pop' his ears. The hearing loss affects both ears and is accompanied by a slight ache and a sensation of pressure. No specific trigger for the hearing loss is identified, and he always wears ear protection at concerts. Around the same time as the hearing issues, he noticed an increase in snoring.  He is currently taking amlodipine  for hypertension, Kenalog  cream, and Celexa  for anxiety. He is satisfied with the low dose of Celexa . His thyroid  levels are stable, with recent T3 and T4 levels checked by his endocrinologist showing normal results. He has a history of Crohn's disease, with recent colonoscopy results showing no evidence of Crohn's in the colon, although he experiences diarrhea if he discontinues his medication, indicating the presence of disease in the small intestine.  He experiences knee pain, which he attributes to prolonged sitting at work. He uses Voltaren  and a compression sleeve at night for relief. X-rays and an ultrasound showed no arthritis.  He admits to not doing well with diet and exercise, although he tries to walk occasionally.      03/08/2024  11:33 AM  Depression screen PHQ 2/9  Decreased Interest 0  Down, Depressed, Hopeless 0  PHQ - 2 Score 0    There are no preventive  care reminders to display for this patient.   ROS: Per HPI, otherwise a complete review of systems was negative.   PMH:  The following were reviewed and entered/updated in epic: Past Medical History:  Diagnosis Date   Crohn's disease (HCC)    GERD (gastroesophageal reflux disease)    Hyperlipidemia    Hypertension    Patient Active Problem List   Diagnosis Date Noted   Left knee pain 03/01/2024   Gluteal tendinitis of right buttock 03/24/2023   Hashimoto thyroiditis 12/16/2022   Fatigue 11/19/2021   Anxiety 11/05/2021   Greater trochanteric bursitis of both hips 10/08/2021   Sacroiliac pain 12/03/2020   Nonallopathic lesion of sacral region 08/24/2018   Nonallopathic lesion of lumbosacral region 08/24/2018   Nonallopathic lesion of thoracic region 08/24/2018   Low back pain 07/27/2018   Vitamin D  deficiency 11/29/2017   Prediabetes 12/24/2016   Tinnitus 12/24/2016   Crohn's disease (HCC) 06/11/2016   Eczema 06/11/2016   Fatty liver 06/11/2016   GERD (gastroesophageal reflux disease) 06/11/2016   IBS (irritable bowel syndrome) 06/11/2016   Vitamin B12 deficiency 06/11/2016   Dyslipidemia 05/25/2014   Benign essential hypertension 10/01/2013   Metabolic syndrome 12/10/2011   Past Surgical History:  Procedure Laterality Date   LIVER BIOPSY      History reviewed. No pertinent family history.  Medications- reviewed and updated Current Outpatient Medications  Medication Sig Dispense Refill   betamethasone  valerate ointment (VALISONE ) 0.1 % Apply 1 application topically 2 (two) times daily. 30 g 0   gabapentin  (NEURONTIN ) 400 MG capsule TAKE 1 CAPSULE BY MOUTH EVERYDAY AT BEDTIME 90 capsule 1   hydrOXYzine  (ATARAX ) 25 MG tablet TAKE 1 TABLET BY MOUTH THREE TIMES A DAY AS NEEDED 270 tablet 1   levothyroxine (SYNTHROID) 50 MCG tablet Take by mouth.     mesalamine  (LIALDA ) 1.2 g EC tablet Take by mouth.     omeprazole (PRILOSEC) 40 MG capsule Take 1 capsule by mouth every  morning.     tiZANidine  (ZANAFLEX ) 2 MG tablet TAKE 1 TABLET BY MOUTH EVERYDAY AT BEDTIME 90 tablet 1   Vitamin D , Ergocalciferol , (DRISDOL ) 1.25 MG (50000 UNIT) CAPS capsule TAKE 1 CAPSULE (50,000 UNITS TOTAL) BY MOUTH EVERY 7 (SEVEN) DAYS 12 capsule 0   amLODipine  (NORVASC ) 5 MG tablet Take 1 tablet (5 mg total) by mouth daily. 90 tablet 3   citalopram  (CELEXA ) 10 MG tablet Take 1 tablet (10 mg total) by mouth daily. 90 tablet 3   triamcinolone  cream (KENALOG ) 0.5 % Apply topically 3 (three) times daily. 30 g 5   No current facility-administered medications for this visit.    Allergies-reviewed and updated Allergies[1]  Social History   Socioeconomic History   Marital status: Married    Spouse name: Not on file   Number of children: Not on file   Years of education: Not on file   Highest education level: Not on file  Occupational History   Not on file  Tobacco Use   Smoking status: Never   Smokeless tobacco: Never  Vaping Use   Vaping status: Never Used  Substance and Sexual Activity   Alcohol use: No   Drug use: No   Sexual activity: Yes  Other Topics Concern   Not on file  Social History Narrative   Not on file  Social Drivers of Health   Tobacco Use: Low Risk (03/08/2024)   Patient History    Smoking Tobacco Use: Never    Smokeless Tobacco Use: Never    Passive Exposure: Not on file  Financial Resource Strain: Not on file  Food Insecurity: Not on file  Transportation Needs: Not on file  Physical Activity: Not on file  Stress: Not on file  Social Connections: Not on file  Depression (PHQ2-9): Low Risk (03/08/2024)   Depression (PHQ2-9)    PHQ-2 Score: 0  Alcohol Screen: Not on file  Housing: Not on file  Utilities: Not on file  Health Literacy: Not on file        Objective:  Physical Exam: BP 120/88   Pulse 68   Temp 98 F (36.7 C) (Temporal)   Ht 6' 4 (1.93 m)   Wt 268 lb 3.2 oz (121.7 kg)   SpO2 98%   BMI 32.65 kg/m   Body mass index is  32.65 kg/m. Wt Readings from Last 3 Encounters:  03/08/24 268 lb 3.2 oz (121.7 kg)  03/01/24 266 lb (120.7 kg)  12/22/23 266 lb (120.7 kg)   Gen: NAD, resting comfortably HEENT: TMs normal bilaterally. OP clear. No thyromegaly noted.  CV: RRR with no murmurs appreciated Pulm: NWOB, CTAB with no crackles, wheezes, or rhonchi GI: Normal bowel sounds present. Soft, Nontender, Nondistended. MSK: no edema, cyanosis, or clubbing noted Skin: warm, dry Neuro: CN2-12 grossly intact. Strength 5/5 in upper and lower extremities. Reflexes symmetric and intact bilaterally.  Psych: Normal affect and thought content     Cahlil Sattar M. Kennyth, MD 03/08/2024 11:59 AM     [1]  Allergies Allergen Reactions   Other Diarrhea and Nausea And Vomiting    Champagne    Codeine Nausea Only   "

## 2024-03-08 NOTE — Assessment & Plan Note (Signed)
 Stable on celexa  20 mg daily.  Will refill today.

## 2024-03-08 NOTE — Assessment & Plan Note (Signed)
 Follows with gastroenterology.  Most recent colonoscopy did not show any signs of Crohn disease in his colon.

## 2024-03-08 NOTE — Assessment & Plan Note (Signed)
Check vitamin D with labs.

## 2024-03-08 NOTE — Assessment & Plan Note (Signed)
 Check A1c with labs.

## 2024-03-08 NOTE — Assessment & Plan Note (Signed)
 Check B12 with labs.

## 2024-03-08 NOTE — Assessment & Plan Note (Addendum)
 At goal today on amlodipine  5 mg daily.  Will refill today.

## 2024-03-08 NOTE — Assessment & Plan Note (Signed)
 Follows with endocrinology.  Most recent labs were at goal

## 2024-03-08 NOTE — Patient Instructions (Signed)
 It was very nice to see you today!  VISIT SUMMARY: During your visit, we discussed your hearing loss, medication refills, and the need for blood work to monitor various conditions. You have been referred to an ENT specialist for further evaluation of your hearing issues.  YOUR PLAN: HEARING LOSS: You have been experiencing hearing loss in both ears, along with a sensation of pressure and slight ache. -You have been referred to an ENT specialist for further evaluation and management.  BENIGN ESSENTIAL HYPERTENSION: Your high blood pressure is being managed with amlodipine . -Your amlodipine  prescription has been refilled.  ANXIETY DISORDER: Your anxiety is being managed with a low dose of Celexa . -Your Celexa  prescription has been refilled.  ECZEMA: Your eczema is being managed with Kenalog  cream. -Your Kenalog  cream prescription has been refilled.  CROHN'S DISEASE: Your recent colonoscopy was negative for colonic disease, but symptoms suggest small intestine involvement. -Continue your current management and monitor your symptoms.  DYSLIPIDEMIA: You need to monitor your cholesterol levels. -Blood work has been ordered to check your cholesterol levels.  PREDIABETES: You need to monitor your blood sugar levels. -Blood work has been ordered to check your blood sugar levels.  VITAMIN D  DEFICIENCY: You need to monitor your vitamin D  levels. -Blood work has been ordered to check your vitamin D  levels.  VITAMIN B12 DEFICIENCY: You need to monitor your vitamin B12 levels. -Blood work has been ordered to check your vitamin B12 levels.  Return in about 1 year (around 03/08/2025) for Annual Physical.   Take care, Dr Kennyth  PLEASE NOTE:  If you had any lab tests, please let us  know if you have not heard back within a few days. You may see your results on mychart before we have a chance to review them but we will give you a call once they are reviewed by us .   If we ordered any referrals  today, please let us  know if you have not heard from their office within the next week.   If you had any urgent prescriptions sent in today, please check with the pharmacy within an hour of our visit to make sure the prescription was transmitted appropriately.   Please try these tips to maintain a healthy lifestyle:  Eat at least 3 REAL meals and 1-2 snacks per day.  Aim for no more than 5 hours between eating.  If you eat breakfast, please do so within one hour of getting up.   Each meal should contain half fruits/vegetables, one quarter protein, and one quarter carbs (no bigger than a computer mouse)  Cut down on sweet beverages. This includes juice, soda, and sweet tea.   Drink at least 1 glass of water with each meal and aim for at least 8 glasses per day  Exercise at least 150 minutes every week.     Preventive Care 56-72 Years Old, Male Preventive care refers to lifestyle choices and visits with your health care provider that can promote health and wellness. Preventive care visits are also called wellness exams. What can I expect for my preventive care visit? Counseling During your preventive care visit, your health care provider may ask about your: Medical history, including: Past medical problems. Family medical history. Current health, including: Emotional well-being. Home life and relationship well-being. Sexual activity. Lifestyle, including: Alcohol, nicotine or tobacco, and drug use. Access to firearms. Diet, exercise, and sleep habits. Safety issues such as seatbelt and bike helmet use. Sunscreen use. Work and work astronomer. Physical exam Your health  care provider will check your: Height and weight. These may be used to calculate your BMI (body mass index). BMI is a measurement that tells if you are at a healthy weight. Waist circumference. This measures the distance around your waistline. This measurement also tells if you are at a healthy weight and may help  predict your risk of certain diseases, such as type 2 diabetes and high blood pressure. Heart rate and blood pressure. Body temperature. Skin for abnormal spots. What immunizations do I need?  Vaccines are usually given at various ages, according to a schedule. Your health care provider will recommend vaccines for you based on your age, medical history, and lifestyle or other factors, such as travel or where you work. What tests do I need? Screening Your health care provider may recommend screening tests for certain conditions. This may include: Lipid and cholesterol levels. Diabetes screening. This is done by checking your blood sugar (glucose) after you have not eaten for a while (fasting). Hepatitis B test. Hepatitis C test. HIV (human immunodeficiency virus) test. STI (sexually transmitted infection) testing, if you are at risk. Lung cancer screening. Prostate cancer screening. Colorectal cancer screening. Talk with your health care provider about your test results, treatment options, and if necessary, the need for more tests. Follow these instructions at home: Eating and drinking  Eat a diet that includes fresh fruits and vegetables, whole grains, lean protein, and low-fat dairy products. Take vitamin and mineral supplements as recommended by your health care provider. Do not drink alcohol if your health care provider tells you not to drink. If you drink alcohol: Limit how much you have to 0-2 drinks a day. Know how much alcohol is in your drink. In the [place] one drink equals one 12 oz bottle of beer (355 mL), one 5 oz glass of wine (148 mL), or one 1 oz glass of hard liquor (44 mL). Lifestyle Brush your teeth every morning and night with fluoride toothpaste. Floss one time each day. Exercise for at least 30 minutes 5 or more days each week. Do not use any products that contain nicotine or tobacco. These products include cigarettes, chewing tobacco, and vaping devices, such as  e-cigarettes. If you need help quitting, ask your health care provider. Do not use drugs. If you are sexually active, practice safe sex. Use a condom or other form of protection to prevent STIs. Take aspirin only as told by your health care provider. Make sure that you understand how much to take and what form to take. Work with your health care provider to find out whether it is safe and beneficial for you to take aspirin daily. Find healthy ways to manage stress, such as: Meditation, yoga, or listening to music. Journaling. Talking to a trusted person. Spending time with friends and family. Minimize exposure to UV radiation to reduce your risk of skin cancer. Safety Always wear your seat belt while driving or riding in a vehicle. Do not drive: If you have been drinking alcohol. Do not ride with someone who has been drinking. When you are tired or distracted. While texting. If you have been using any mind-altering substances or drugs. Wear a helmet and other protective equipment during sports activities. If you have firearms in your house, make sure you follow all gun safety procedures. What's next? Go to your health care provider once a year for an annual wellness visit. Ask your health care provider how often you should have your eyes and teeth checked. Stay up  to date on all vaccines. This information is not intended to replace advice given to you by your health care provider. Make sure you discuss any questions you have with your health care provider. Document Revised: 08/06/2020 Document Reviewed: 08/06/2020 Elsevier Patient Education  2024 [employer].

## 2024-03-09 ENCOUNTER — Ambulatory Visit: Payer: Self-pay | Admitting: Family Medicine

## 2024-03-09 NOTE — Progress Notes (Signed)
 A1c is 7.1 which is elevated into the diabetic range.  His triglycerides are also very elevated which is likely related to his blood sugar.  He would likely benefit from starting medication to improve these numbers.  Recommend he come in for office to discuss treatment options for this.  His vitamin D  is also slightly low.  Recommend supplementation with 5000 IUs daily.  He can get this over-the-counter and we can recheck again in 3 to 6 months.  His cholesterol is also very elevated.  He would likely benefit from starting a cholesterol medication to improve these numbers and also low risk of heart attack and stroke.  Please send a prescription for Lipitor 40 mg daily if he is agreeable to start.  We should recheck this in 6 to 12 months.  All his other labs are at goal and we can recheck everything else in a year or so.

## 2024-03-12 MED ORDER — ATORVASTATIN CALCIUM 40 MG PO TABS: 40.0000 mg | ORAL_TABLET | Freq: Every day | ORAL | 1 refills | Status: AC

## 2024-03-12 NOTE — Telephone Encounter (Signed)
 "  See patient note  "

## 2024-03-13 NOTE — Telephone Encounter (Signed)
 We can try a lower dose of Lipitor to see if it is better tolerated.  Please send in Lipitor 10 mg daily if it is agreeable to start.  I think vitamin D  is a good idea.  Okay for him to continue supplementation with this.  I think a GLP would be a good idea to help with his sugar is also help with his weight loss.  Recommend starting Mounjaro 2.5 mg weekly and have him follow-up with us  in a few weeks.

## 2024-03-14 ENCOUNTER — Other Ambulatory Visit: Payer: Self-pay | Admitting: *Deleted

## 2024-03-14 NOTE — Telephone Encounter (Signed)
 Spoke with patient, stated will put on hold per now Rx Mounjaro, want to check A1C in 3 month  Pick up Lipitor 40mg  today form the pharmacy, will let us  know if has any symptoms

## 2024-03-14 NOTE — Telephone Encounter (Signed)
 Reason for CRM:  Arvella is returning RMA Elora Sites call. I tried to transfer to clinic and no answer. Please call him at [phone]. Thanks   Already spoke with Elora.

## 2024-03-14 NOTE — Telephone Encounter (Signed)
 Left message to return call to our office at their convenience.

## 2024-03-15 ENCOUNTER — Encounter (INDEPENDENT_AMBULATORY_CARE_PROVIDER_SITE_OTHER): Payer: Self-pay | Admitting: Physician Assistant

## 2024-03-15 ENCOUNTER — Ambulatory Visit (INDEPENDENT_AMBULATORY_CARE_PROVIDER_SITE_OTHER): Admitting: Physician Assistant

## 2024-03-15 VITALS — BP 136/85 | HR 89 | Temp 97.9°F | Ht 76.0 in | Wt 265.0 lb

## 2024-03-15 DIAGNOSIS — H9193 Unspecified hearing loss, bilateral: Secondary | ICD-10-CM

## 2024-03-15 DIAGNOSIS — R0683 Snoring: Secondary | ICD-10-CM | POA: Diagnosis not present

## 2024-03-16 ENCOUNTER — Encounter (INDEPENDENT_AMBULATORY_CARE_PROVIDER_SITE_OTHER): Payer: Self-pay

## 2024-03-16 NOTE — Progress Notes (Signed)
 Dear Dr. Kennyth, Here is my assessment for our mutual patient, Johnny [name]. Thank you for allowing me the opportunity to care for your patient. Please do not hesitate to contact me should you have any other questions. Sincerely, Chyrl Cohen PA-C  Otolaryngology Clinic Note Referring provider: Dr. Kennyth HPI:  Johnny [name] is a 44 y.o. male kindly referred by Dr. Kennyth   Discussed the use of AI scribe software for clinical note transcription with the patient, who gave verbal consent to proceed.  History of Present Illness   Johnny [name] is a 44 year old male who presents with progressive bilateral hearing loss and increased snoring.  Over the past 6 to 9 months, he has experienced progressive bilateral hearing loss, described as a muffled sensation with a dull ache in both ears. He denies recent otitis media, otologic trauma, or childhood ear or hearing problems. He frequently attends concerts but has consistently used earplugs for the past 10-15 years. He reports a frequent need to perform Valsalva maneuvers to pop his ears, which does not improve his hearing. No history of tonsillectomy.  During the same period, he has developed loud snoring in all sleeping positions. The onset of snoring followed a significant weight loss of approximately 40 pounds due to Hashimoto's and gastritis, with subsequent rapid weight regain to a current weight of 165 pounds, above his previous baseline of 150 pounds. His wife has not observed apneic episodes but notes that the snoring frequently wakes him. He experiences frequent nighttime awakenings, mild daytime fatigue, and occasional nocturia when awakened by snoring. He denies significant nasal congestion, dyspnea, or allergies, though he occasionally sneezes and takes loratadine as needed.           Independent Review of Additional Tests or Records:  none   PMH/Meds/All/SocHx/FamHx/ROS:   Past Medical History:  Diagnosis Date   Crohn's  disease (HCC)    GERD (gastroesophageal reflux disease)    Hyperlipidemia    Hypertension      Past Surgical History:  Procedure Laterality Date   LIVER BIOPSY      History reviewed. No pertinent family history.   Social Connections: Not on file     Current Medications[1]   Physical Exam:   BP 136/85   Pulse 89   Temp 97.9 F (36.6 C)   Ht 6' 4 (1.93 m)   Wt 265 lb (120.2 kg)   SpO2 95%   BMI 32.26 kg/m   Pertinent Findings  CN II-XII grossly intact Bilateral EAC clear and TM intact with well pneumatized middle ear spaces Anterior rhinoscopy: Septum midline; bilateral inferior turbinates with no hypertrophy No lesions of oral cavity/oropharynx; dentition wnl, No visible tonsils  No obviously palpable neck masses/lymphadenopathy/thyromegaly No respiratory distress or stridor   Seprately Identifiable Procedures:  None  Impression & Plans:  Johnny [name] is a 44 y.o. male with the following   Assessment and Plan    Bilateral hearing loss Unclear etiology. Further evaluation needed. - Ordered audiogram to assess hearing levels and differentiate between types of hearing loss. - Scheduled follow-up to review audiogram results and discuss management if abnormal. - Audiologist to provide explanation and counseling if audiogram is normal.  Snoring with suspected sleep apnea High suspicion for sleep apnea due to symptoms and history. Early management crucial to prevent complications. - No nasal congestion or signs of airway crowding  - Referred to sleep medicine specialist for evaluation. - Discussed importance of early diagnosis and management to reduce cardiovascular and  end-organ risks. - Reviewed nonmedical options like weight management and potential treatments including CPAP  - Informed that sleep clinic will contact to arrange evaluation.           - f/u Office phone visit with audio results    Thank you for allowing me the opportunity to care for  your patient. Please do not hesitate to contact me should you have any other questions.  Sincerely, Chyrl Cohen PA-C  [facility] Phone: [phone] Fax: [phone]  03/16/2024, 8:45 AM        [1]  Current Outpatient Medications:    amLODipine  (NORVASC ) 5 MG tablet, Take 1 tablet (5 mg total) by mouth daily., Disp: 90 tablet, Rfl: 3   atorvastatin  (LIPITOR) 40 MG tablet, Take 1 tablet (40 mg total) by mouth daily., Disp: 30 tablet, Rfl: 1   betamethasone  valerate ointment (VALISONE ) 0.1 %, Apply 1 application topically 2 (two) times daily., Disp: 30 g, Rfl: 0   citalopram  (CELEXA ) 10 MG tablet, Take 1 tablet (10 mg total) by mouth daily., Disp: 90 tablet, Rfl: 3   gabapentin  (NEURONTIN ) 400 MG capsule, TAKE 1 CAPSULE BY MOUTH EVERYDAY AT BEDTIME, Disp: 90 capsule, Rfl: 1   hydrOXYzine  (ATARAX ) 25 MG tablet, TAKE 1 TABLET BY MOUTH THREE TIMES A DAY AS NEEDED, Disp: 270 tablet, Rfl: 1   levothyroxine (SYNTHROID) 50 MCG tablet, Take by mouth., Disp: , Rfl:    mesalamine  (LIALDA ) 1.2 g EC tablet, Take by mouth., Disp: , Rfl:    omeprazole (PRILOSEC) 40 MG capsule, Take 1 capsule by mouth every morning., Disp: , Rfl:    tiZANidine  (ZANAFLEX ) 2 MG tablet, TAKE 1 TABLET BY MOUTH EVERYDAY AT BEDTIME, Disp: 90 tablet, Rfl: 1   Vitamin D , Ergocalciferol , (DRISDOL ) 1.25 MG (50000 UNIT) CAPS capsule, TAKE 1 CAPSULE (50,000 UNITS TOTAL) BY MOUTH EVERY 7 (SEVEN) DAYS, Disp: 12 capsule, Rfl: 0   triamcinolone  cream (KENALOG ) 0.5 %, Apply topically 3 (three) times daily., Disp: 30 g, Rfl: 5

## 2024-03-28 ENCOUNTER — Other Ambulatory Visit: Payer: Self-pay | Admitting: Family Medicine

## 2024-04-11 ENCOUNTER — Ambulatory Visit (INDEPENDENT_AMBULATORY_CARE_PROVIDER_SITE_OTHER)

## 2024-04-26 ENCOUNTER — Ambulatory Visit: Admitting: Family Medicine

## 2024-07-23 ENCOUNTER — Encounter: Admitting: Family Medicine
